# Patient Record
Sex: Male | Born: 1970 | Race: White | Hispanic: No | Smoking: Never smoker
Health system: Southern US, Community
[De-identification: ages and names within clinical notes are randomized; demographics above are authoritative.]

## PROBLEM LIST (undated history)

## (undated) DIAGNOSIS — Z800 Family history of malignant neoplasm of digestive organs: Secondary | ICD-10-CM

## (undated) DIAGNOSIS — D649 Anemia, unspecified: Secondary | ICD-10-CM

## (undated) DIAGNOSIS — N289 Disorder of kidney and ureter, unspecified: Secondary | ICD-10-CM

## (undated) DIAGNOSIS — R51 Headache: Secondary | ICD-10-CM

## (undated) DIAGNOSIS — F419 Anxiety disorder, unspecified: Secondary | ICD-10-CM

## (undated) DIAGNOSIS — A4902 Methicillin resistant Staphylococcus aureus infection, unspecified site: Secondary | ICD-10-CM

## (undated) DIAGNOSIS — I10 Essential (primary) hypertension: Secondary | ICD-10-CM

## (undated) DIAGNOSIS — R519 Headache, unspecified: Secondary | ICD-10-CM

## (undated) DIAGNOSIS — Z8042 Family history of malignant neoplasm of prostate: Secondary | ICD-10-CM

## (undated) DIAGNOSIS — Z803 Family history of malignant neoplasm of breast: Secondary | ICD-10-CM

## (undated) DIAGNOSIS — L0390 Cellulitis, unspecified: Secondary | ICD-10-CM

## (undated) DIAGNOSIS — Z8051 Family history of malignant neoplasm of kidney: Secondary | ICD-10-CM

## (undated) DIAGNOSIS — G4730 Sleep apnea, unspecified: Secondary | ICD-10-CM

## (undated) DIAGNOSIS — Z801 Family history of malignant neoplasm of trachea, bronchus and lung: Secondary | ICD-10-CM

## (undated) DIAGNOSIS — K219 Gastro-esophageal reflux disease without esophagitis: Secondary | ICD-10-CM

## (undated) DIAGNOSIS — E039 Hypothyroidism, unspecified: Secondary | ICD-10-CM

## (undated) HISTORY — DX: Family history of malignant neoplasm of digestive organs: Z80.0

## (undated) HISTORY — DX: Family history of malignant neoplasm of trachea, bronchus and lung: Z80.1

## (undated) HISTORY — DX: Anemia, unspecified: D64.9

## (undated) HISTORY — DX: Family history of malignant neoplasm of breast: Z80.3

## (undated) HISTORY — DX: Family history of malignant neoplasm of kidney: Z80.51

## (undated) HISTORY — DX: Family history of malignant neoplasm of prostate: Z80.42

---

## 1999-03-06 ENCOUNTER — Other Ambulatory Visit: Admission: RE | Admit: 1999-03-06 | Discharge: 1999-03-24 | Payer: Self-pay

## 2008-12-07 ENCOUNTER — Emergency Department: Payer: Self-pay | Admitting: Emergency Medicine

## 2009-10-14 ENCOUNTER — Ambulatory Visit: Payer: Self-pay | Admitting: Internal Medicine

## 2009-10-18 ENCOUNTER — Ambulatory Visit: Payer: Self-pay | Admitting: Surgery

## 2009-11-27 ENCOUNTER — Emergency Department: Payer: Self-pay | Admitting: Emergency Medicine

## 2010-01-30 ENCOUNTER — Ambulatory Visit: Payer: Self-pay | Admitting: Internal Medicine

## 2010-05-22 ENCOUNTER — Emergency Department: Payer: Self-pay | Admitting: Emergency Medicine

## 2010-05-27 ENCOUNTER — Other Ambulatory Visit: Payer: Self-pay | Admitting: Family

## 2010-06-02 ENCOUNTER — Ambulatory Visit: Payer: Self-pay | Admitting: Internal Medicine

## 2010-06-13 ENCOUNTER — Ambulatory Visit: Payer: Self-pay | Admitting: Internal Medicine

## 2010-07-10 ENCOUNTER — Emergency Department: Payer: Self-pay | Admitting: Emergency Medicine

## 2011-01-29 ENCOUNTER — Ambulatory Visit: Payer: Self-pay | Admitting: General Practice

## 2011-03-23 ENCOUNTER — Ambulatory Visit: Payer: Self-pay | Admitting: Internal Medicine

## 2011-04-08 ENCOUNTER — Ambulatory Visit: Payer: Self-pay | Admitting: Internal Medicine

## 2011-04-08 ENCOUNTER — Ambulatory Visit: Payer: Self-pay | Admitting: Oncology

## 2011-04-14 ENCOUNTER — Ambulatory Visit: Payer: Self-pay | Admitting: Oncology

## 2011-04-18 ENCOUNTER — Ambulatory Visit: Payer: Self-pay | Admitting: Oncology

## 2011-05-19 ENCOUNTER — Ambulatory Visit: Payer: Self-pay | Admitting: Oncology

## 2011-05-19 HISTORY — PX: LAPAROSCOPIC GASTRIC SLEEVE RESECTION: SHX5895

## 2011-07-21 ENCOUNTER — Ambulatory Visit: Payer: Self-pay | Admitting: Oncology

## 2011-08-03 ENCOUNTER — Ambulatory Visit: Payer: Self-pay | Admitting: Oncology

## 2011-08-03 LAB — CBC CANCER CENTER
Basophil #: 0.1 x10 3/mm (ref 0.0–0.1)
Basophil %: 1.2 %
Eosinophil %: 6.4 %
HCT: 42.1 % (ref 40.0–52.0)
Lymphocyte %: 25.6 %
MCH: 31.8 pg (ref 26.0–34.0)
MCHC: 35.4 g/dL (ref 32.0–36.0)
MCV: 90 fL (ref 80–100)
Monocyte %: 8.8 %
Neutrophil #: 4.2 x10 3/mm (ref 1.4–6.5)
WBC: 7.3 x10 3/mm (ref 3.8–10.6)

## 2011-08-03 LAB — COMPREHENSIVE METABOLIC PANEL
Albumin: 4.1 g/dL (ref 3.4–5.0)
Anion Gap: 13 (ref 7–16)
Bilirubin,Total: 0.6 mg/dL (ref 0.2–1.0)
Calcium, Total: 8.5 mg/dL (ref 8.5–10.1)
Chloride: 102 mmol/L (ref 98–107)
Co2: 21 mmol/L (ref 21–32)
Creatinine: 1.25 mg/dL (ref 0.60–1.30)
EGFR (African American): >60
EGFR (Non-African Amer.): >60
Glucose: 97 mg/dL (ref 65–99)
Potassium: 4.3 mmol/L (ref 3.5–5.1)
SGOT(AST): 10 U/L — ABNORMAL LOW (ref 15–37)
Sodium: 136 mmol/L (ref 136–145)

## 2011-08-17 ENCOUNTER — Ambulatory Visit: Payer: Self-pay | Admitting: Oncology

## 2011-12-08 ENCOUNTER — Emergency Department: Payer: Self-pay | Admitting: Emergency Medicine

## 2011-12-10 LAB — WOUND AEROBIC CULTURE

## 2012-04-13 ENCOUNTER — Emergency Department: Payer: Self-pay | Admitting: Emergency Medicine

## 2012-04-13 LAB — BASIC METABOLIC PANEL
Calcium, Total: 9.3 mg/dL (ref 8.5–10.1)
Chloride: 105 mmol/L (ref 98–107)
Co2: 25 mmol/L (ref 21–32)
EGFR (African American): >60
EGFR (Non-African Amer.): >60
Glucose: 103 mg/dL — ABNORMAL HIGH (ref 65–99)
Osmolality: 273 (ref 275–301)
Sodium: 137 mmol/L (ref 136–145)

## 2012-04-13 LAB — CBC
HCT: 44.3 % (ref 40.0–52.0)
MCHC: 35.2 g/dL (ref 32.0–36.0)
RDW: 13.2 % (ref 11.5–14.5)

## 2012-05-18 DIAGNOSIS — A4902 Methicillin resistant Staphylococcus aureus infection, unspecified site: Secondary | ICD-10-CM

## 2012-05-18 HISTORY — DX: Methicillin resistant Staphylococcus aureus infection, unspecified site: A49.02

## 2012-09-04 ENCOUNTER — Emergency Department: Payer: Self-pay | Admitting: Emergency Medicine

## 2012-09-04 ENCOUNTER — Ambulatory Visit: Payer: Self-pay | Admitting: Emergency Medicine

## 2012-09-04 LAB — CBC
HCT: 41 % (ref 40.0–52.0)
HGB: 14.2 g/dL (ref 13.0–18.0)
MCH: 31.7 pg (ref 26.0–34.0)
MCHC: 34.7 g/dL (ref 32.0–36.0)
MCV: 91 fL (ref 80–100)
RBC: 4.49 10*6/uL (ref 4.40–5.90)
WBC: 13.5 10*3/uL — ABNORMAL HIGH (ref 3.8–10.6)

## 2012-09-04 LAB — COMPREHENSIVE METABOLIC PANEL
Albumin: 4 g/dL (ref 3.4–5.0)
Alkaline Phosphatase: 55 U/L (ref 50–136)
Anion Gap: 5 — ABNORMAL LOW (ref 7–16)
Bilirubin,Total: 0.7 mg/dL (ref 0.2–1.0)
Calcium, Total: 8.9 mg/dL (ref 8.5–10.1)
Chloride: 106 mmol/L (ref 98–107)
Co2: 26 mmol/L (ref 21–32)
Creatinine: 1.16 mg/dL (ref 0.60–1.30)
EGFR (African American): >60
EGFR (Non-African Amer.): >60
Glucose: 85 mg/dL (ref 65–99)
Osmolality: 272 (ref 275–301)
SGOT(AST): 33 U/L (ref 15–37)
Total Protein: 8.1 g/dL (ref 6.4–8.2)

## 2012-12-07 ENCOUNTER — Ambulatory Visit: Payer: Self-pay | Admitting: Specialist

## 2012-12-07 LAB — CBC WITH DIFFERENTIAL/PLATELET
Basophil #: 0.1 10*3/uL (ref 0.0–0.1)
Basophil %: 1.3 %
Lymphocyte #: 1.6 10*3/uL (ref 1.0–3.6)
Lymphocyte %: 22.3 %
Monocyte #: 0.6 x10 3/mm (ref 0.2–1.0)
Monocyte %: 8.7 %
Neutrophil #: 4.3 10*3/uL (ref 1.4–6.5)
Platelet: 312 10*3/uL (ref 150–440)
RBC: 4.79 10*6/uL (ref 4.40–5.90)
RDW: 14.3 % (ref 11.5–14.5)

## 2012-12-07 LAB — COMPREHENSIVE METABOLIC PANEL
Albumin: 4.2 g/dL (ref 3.4–5.0)
Anion Gap: 4 — ABNORMAL LOW (ref 7–16)
BUN: 19 mg/dL — ABNORMAL HIGH (ref 7–18)
Bilirubin,Total: 0.6 mg/dL (ref 0.2–1.0)
Calcium, Total: 9.1 mg/dL (ref 8.5–10.1)
Chloride: 105 mmol/L (ref 98–107)
Co2: 27 mmol/L (ref 21–32)
Creatinine: 1.11 mg/dL (ref 0.60–1.30)
EGFR (African American): >60
Glucose: 99 mg/dL (ref 65–99)
Osmolality: 274 (ref 275–301)
SGOT(AST): 34 U/L (ref 15–37)
SGPT (ALT): 47 U/L (ref 12–78)
Sodium: 136 mmol/L (ref 136–145)
Total Protein: 8.4 g/dL — ABNORMAL HIGH (ref 6.4–8.2)

## 2012-12-07 LAB — LIPASE, BLOOD: Lipase: 172 U/L (ref 73–393)

## 2012-12-07 LAB — AMYLASE: Amylase: 38 U/L (ref 25–115)

## 2012-12-07 LAB — IRON AND TIBC
Iron: 94 ug/dL (ref 65–175)
Unbound Iron-Bind.Cap.: 278 ug/dL

## 2012-12-07 LAB — TSH: Thyroid Stimulating Horm: 11.5 u[IU]/mL — ABNORMAL HIGH

## 2012-12-07 LAB — MAGNESIUM: Magnesium: 2.2 mg/dL

## 2012-12-07 LAB — FOLATE: Folic Acid: 6.2 ng/mL (ref 3.1–100.0)

## 2012-12-07 LAB — BILIRUBIN, DIRECT: Bilirubin, Direct: 0.1 mg/dL (ref 0.00–0.20)

## 2012-12-07 LAB — APTT: Activated PTT: 30.8 secs (ref 23.6–35.9)

## 2012-12-07 LAB — PROTIME-INR: INR: 1

## 2012-12-15 ENCOUNTER — Ambulatory Visit: Payer: Self-pay | Admitting: Specialist

## 2012-12-16 ENCOUNTER — Ambulatory Visit: Payer: Self-pay | Admitting: Specialist

## 2013-02-17 ENCOUNTER — Ambulatory Visit: Payer: Self-pay | Admitting: Emergency Medicine

## 2013-02-17 ENCOUNTER — Inpatient Hospital Stay: Payer: Self-pay | Admitting: Internal Medicine

## 2013-02-17 LAB — URINALYSIS, COMPLETE
Bacteria: NEGATIVE
Bilirubin,UR: NEGATIVE
Blood: NEGATIVE
Glucose,UR: NEGATIVE mg/dL (ref 0–75)
Ketone: NEGATIVE
Leukocyte Esterase: NEGATIVE
Nitrite: NEGATIVE
Ph: 7 (ref 4.5–8.0)
Protein: NEGATIVE
RBC,UR: NONE SEEN /HPF (ref 0–5)
Specific Gravity: 1.02 (ref 1.003–1.030)
Squamous Epithelial: NONE SEEN

## 2013-02-17 LAB — CBC WITH DIFFERENTIAL/PLATELET
Basophil #: 0.1 10*3/uL (ref 0.0–0.1)
Basophil %: 0.9 %
Eosinophil #: 0.3 10*3/uL (ref 0.0–0.7)
Eosinophil %: 2.6 %
HCT: 45.6 % (ref 40.0–52.0)
HGB: 16 g/dL (ref 13.0–18.0)
Lymphocyte #: 1.1 10*3/uL (ref 1.0–3.6)
Lymphocyte %: 8.8 %
MCH: 32.1 pg (ref 26.0–34.0)
MCHC: 35.1 g/dL (ref 32.0–36.0)
MCV: 92 fL (ref 80–100)
Monocyte #: 0.9 x10 3/mm (ref 0.2–1.0)
Monocyte %: 7.4 %
Neutrophil #: 10.2 10*3/uL — ABNORMAL HIGH (ref 1.4–6.5)
Neutrophil %: 80.3 %
Platelet: 315 10*3/uL (ref 150–440)
RBC: 4.98 10*6/uL (ref 4.40–5.90)
RDW: 13.9 % (ref 11.5–14.5)
WBC: 12.7 10*3/uL — ABNORMAL HIGH (ref 3.8–10.6)

## 2013-02-17 LAB — COMPREHENSIVE METABOLIC PANEL
Albumin: 4.2 g/dL (ref 3.4–5.0)
Alkaline Phosphatase: 90 U/L (ref 50–136)
Anion Gap: 10 (ref 7–16)
BUN: 11 mg/dL (ref 7–18)
Bilirubin,Total: 1 mg/dL (ref 0.2–1.0)
Calcium, Total: 8.7 mg/dL (ref 8.5–10.1)
Chloride: 97 mmol/L — ABNORMAL LOW (ref 98–107)
Co2: 25 mmol/L (ref 21–32)
Creatinine: 1.18 mg/dL (ref 0.60–1.30)
EGFR (African American): >60
EGFR (Non-African Amer.): >60
Glucose: 142 mg/dL — ABNORMAL HIGH (ref 65–99)
Osmolality: 266 (ref 275–301)
Potassium: 4.8 mmol/L (ref 3.5–5.1)
SGOT(AST): 45 U/L — ABNORMAL HIGH (ref 15–37)
SGPT (ALT): 55 U/L (ref 12–78)
Sodium: 132 mmol/L — ABNORMAL LOW (ref 136–145)
Total Protein: 8.7 g/dL — ABNORMAL HIGH (ref 6.4–8.2)

## 2013-02-17 LAB — LIPID PANEL: HDL Cholesterol: 30 mg/dL — ABNORMAL LOW (ref 40–60)

## 2013-02-17 LAB — AMYLASE: Amylase: 150 U/L — ABNORMAL HIGH (ref 25–115)

## 2013-02-17 LAB — LIPASE, BLOOD: Lipase: 1725 U/L — ABNORMAL HIGH (ref 73–393)

## 2013-02-17 LAB — CREATININE, SERUM: Creatinine: 1.05 mg/dL (ref 0.60–1.30)

## 2013-02-18 LAB — COMPREHENSIVE METABOLIC PANEL
Albumin: 3.4 g/dL (ref 3.4–5.0)
Alkaline Phosphatase: 68 U/L (ref 50–136)
Anion Gap: 9 (ref 7–16)
Bilirubin,Total: 1 mg/dL (ref 0.2–1.0)
Calcium, Total: 8.8 mg/dL (ref 8.5–10.1)
Chloride: 102 mmol/L (ref 98–107)
Creatinine: 1.11 mg/dL (ref 0.60–1.30)
EGFR (African American): >60
Potassium: 4.1 mmol/L (ref 3.5–5.1)
SGOT(AST): 44 U/L — ABNORMAL HIGH (ref 15–37)

## 2013-02-18 LAB — CBC WITH DIFFERENTIAL/PLATELET
Eosinophil %: 3.7 %
HCT: 38.7 % — ABNORMAL LOW (ref 40.0–52.0)
Lymphocyte #: 1 10*3/uL (ref 1.0–3.6)
Lymphocyte %: 9.1 %
MCH: 32.6 pg (ref 26.0–34.0)
MCHC: 35.8 g/dL (ref 32.0–36.0)
MCV: 91 fL (ref 80–100)
Monocyte #: 1.1 x10 3/mm — ABNORMAL HIGH (ref 0.2–1.0)
Neutrophil #: 8.3 10*3/uL — ABNORMAL HIGH (ref 1.4–6.5)
Neutrophil %: 76.4 %
Platelet: 263 10*3/uL (ref 150–440)
RBC: 4.25 10*6/uL — ABNORMAL LOW (ref 4.40–5.90)
WBC: 10.8 10*3/uL — ABNORMAL HIGH (ref 3.8–10.6)

## 2013-02-18 LAB — LIPASE, BLOOD: Lipase: 711 U/L — ABNORMAL HIGH (ref 73–393)

## 2013-02-18 LAB — TRIGLYCERIDES: Triglycerides: 761 mg/dL — ABNORMAL HIGH (ref 0–200)

## 2013-02-18 LAB — HEMOGLOBIN A1C: Hemoglobin A1C: 5.6 % (ref 4.2–6.3)

## 2013-02-19 LAB — TRIGLYCERIDES: Triglycerides: 538 mg/dL — ABNORMAL HIGH (ref 0–200)

## 2013-02-20 LAB — TRIGLYCERIDES: Triglycerides: 484 mg/dL — ABNORMAL HIGH (ref 0–200)

## 2013-02-22 LAB — CREATININE, SERUM
Creatinine: 1.28 mg/dL (ref 0.60–1.30)
EGFR (African American): >60
EGFR (Non-African Amer.): >60

## 2013-04-03 ENCOUNTER — Ambulatory Visit: Payer: Self-pay | Admitting: Specialist

## 2013-04-04 ENCOUNTER — Inpatient Hospital Stay: Payer: Self-pay | Admitting: Specialist

## 2013-04-04 LAB — CREATININE, SERUM
EGFR (African American): >60
EGFR (Non-African Amer.): >60

## 2013-04-05 LAB — PHOSPHORUS: Phosphorus: 4 mg/dL (ref 2.5–4.9)

## 2013-04-05 LAB — CBC WITH DIFFERENTIAL/PLATELET
Basophil #: 0 10*3/uL (ref 0.0–0.1)
Eosinophil #: 0.1 10*3/uL (ref 0.0–0.7)
Eosinophil %: 1.3 %
HCT: 41.1 % (ref 40.0–52.0)
HGB: 14.1 g/dL (ref 13.0–18.0)
Lymphocyte #: 1.6 10*3/uL (ref 1.0–3.6)
MCH: 31.3 pg (ref 26.0–34.0)
Monocyte #: 0.9 x10 3/mm (ref 0.2–1.0)
Monocyte %: 9.3 %
Platelet: 318 10*3/uL (ref 150–440)
WBC: 9.3 10*3/uL (ref 3.8–10.6)

## 2013-04-05 LAB — BASIC METABOLIC PANEL
BUN: 22 mg/dL — ABNORMAL HIGH (ref 7–18)
Calcium, Total: 8.5 mg/dL (ref 8.5–10.1)
Chloride: 105 mmol/L (ref 98–107)
Co2: 27 mmol/L (ref 21–32)
EGFR (African American): >60
EGFR (Non-African Amer.): >60
Potassium: 4.5 mmol/L (ref 3.5–5.1)
Sodium: 136 mmol/L (ref 136–145)

## 2013-04-07 LAB — PATHOLOGY REPORT

## 2013-04-24 ENCOUNTER — Ambulatory Visit: Payer: Self-pay | Admitting: Specialist

## 2013-05-18 ENCOUNTER — Ambulatory Visit: Payer: Self-pay | Admitting: Specialist

## 2013-05-18 HISTORY — PX: ESOPHAGOGASTRODUODENOSCOPY: SHX1529

## 2013-05-18 HISTORY — PX: COLONOSCOPY: SHX174

## 2013-11-03 ENCOUNTER — Emergency Department: Payer: Self-pay | Admitting: Emergency Medicine

## 2013-11-03 LAB — URINALYSIS, COMPLETE
BACTERIA: NONE SEEN
BILIRUBIN, UR: NEGATIVE
Blood: NEGATIVE
Glucose,UR: NEGATIVE mg/dL (ref 0–75)
KETONE: NEGATIVE
Leukocyte Esterase: NEGATIVE
Nitrite: NEGATIVE
PROTEIN: NEGATIVE
Ph: 7 (ref 4.5–8.0)
RBC, UR: NONE SEEN /HPF (ref 0–5)
Specific Gravity: 1.006 (ref 1.003–1.030)
Squamous Epithelial: NONE SEEN

## 2013-11-03 LAB — COMPREHENSIVE METABOLIC PANEL
AST: 28 U/L (ref 15–37)
Albumin: 3.8 g/dL (ref 3.4–5.0)
Alkaline Phosphatase: 60 U/L
Anion Gap: 6 — ABNORMAL LOW (ref 7–16)
BUN: 14 mg/dL (ref 7–18)
Bilirubin,Total: 0.9 mg/dL (ref 0.2–1.0)
CALCIUM: 8.5 mg/dL (ref 8.5–10.1)
CHLORIDE: 105 mmol/L (ref 98–107)
CO2: 26 mmol/L (ref 21–32)
Creatinine: 1.22 mg/dL (ref 0.60–1.30)
EGFR (African American): >60
EGFR (Non-African Amer.): >60
Glucose: 92 mg/dL (ref 65–99)
Osmolality: 274 (ref 275–301)
Potassium: 3.7 mmol/L (ref 3.5–5.1)
SGPT (ALT): 31 U/L (ref 12–78)
Sodium: 137 mmol/L (ref 136–145)
TOTAL PROTEIN: 7.1 g/dL (ref 6.4–8.2)

## 2013-11-03 LAB — CBC WITH DIFFERENTIAL/PLATELET
Basophil #: 0.1 10*3/uL (ref 0.0–0.1)
Basophil %: 0.8 %
EOS ABS: 0.3 10*3/uL (ref 0.0–0.7)
EOS PCT: 3.2 %
HCT: 41.8 % (ref 40.0–52.0)
HGB: 14.6 g/dL (ref 13.0–18.0)
LYMPHS PCT: 15.1 %
Lymphocyte #: 1.5 10*3/uL (ref 1.0–3.6)
MCH: 31.6 pg (ref 26.0–34.0)
MCHC: 34.8 g/dL (ref 32.0–36.0)
MCV: 91 fL (ref 80–100)
MONOS PCT: 5.4 %
Monocyte #: 0.5 x10 3/mm (ref 0.2–1.0)
Neutrophil #: 7.3 10*3/uL — ABNORMAL HIGH (ref 1.4–6.5)
Neutrophil %: 75.5 %
Platelet: 307 10*3/uL (ref 150–440)
RBC: 4.62 10*6/uL (ref 4.40–5.90)
RDW: 13 % (ref 11.5–14.5)
WBC: 9.7 10*3/uL (ref 3.8–10.6)

## 2013-11-03 LAB — PROTIME-INR
INR: 1.1
Prothrombin Time: 14.2 secs (ref 11.5–14.7)

## 2013-11-03 LAB — CK TOTAL AND CKMB (NOT AT ARMC)
CK, TOTAL: 157 U/L
CK-MB: 1.7 ng/mL (ref 0.5–3.6)

## 2013-11-03 LAB — TROPONIN I: Troponin-I: <0.02 ng/mL

## 2013-11-27 ENCOUNTER — Ambulatory Visit: Payer: Self-pay | Admitting: Family Medicine

## 2013-11-29 ENCOUNTER — Emergency Department: Payer: Self-pay | Admitting: Emergency Medicine

## 2013-11-29 LAB — CBC WITH DIFFERENTIAL/PLATELET
BASOS ABS: 0.1 10*3/uL (ref 0.0–0.1)
Basophil %: 0.7 %
EOS ABS: 0.6 10*3/uL (ref 0.0–0.7)
EOS PCT: 6.5 %
HCT: 44.2 % (ref 40.0–52.0)
HGB: 15.1 g/dL (ref 13.0–18.0)
LYMPHS ABS: 2 10*3/uL (ref 1.0–3.6)
LYMPHS PCT: 23.4 %
MCH: 31.4 pg (ref 26.0–34.0)
MCHC: 34.2 g/dL (ref 32.0–36.0)
MCV: 92 fL (ref 80–100)
Monocyte #: 0.5 x10 3/mm (ref 0.2–1.0)
Monocyte %: 6.4 %
Neutrophil #: 5.4 10*3/uL (ref 1.4–6.5)
Neutrophil %: 63 %
Platelet: 324 10*3/uL (ref 150–440)
RBC: 4.82 10*6/uL (ref 4.40–5.90)
RDW: 13.8 % (ref 11.5–14.5)
WBC: 8.5 10*3/uL (ref 3.8–10.6)

## 2013-11-29 LAB — LIPASE, BLOOD: Lipase: 159 U/L (ref 73–393)

## 2013-11-29 LAB — URINALYSIS, COMPLETE
Bacteria: NONE SEEN
Bilirubin,UR: NEGATIVE
Blood: NEGATIVE
GLUCOSE, UR: NEGATIVE mg/dL (ref 0–75)
Ketone: NEGATIVE
LEUKOCYTE ESTERASE: NEGATIVE
Nitrite: NEGATIVE
PROTEIN: NEGATIVE
Ph: 6 (ref 4.5–8.0)
RBC,UR: <1 /HPF (ref 0–5)
SQUAMOUS EPITHELIAL: NONE SEEN
Specific Gravity: 1.011 (ref 1.003–1.030)

## 2013-11-29 LAB — COMPREHENSIVE METABOLIC PANEL
ANION GAP: 5 — AB (ref 7–16)
AST: 28 U/L (ref 15–37)
Albumin: 4.1 g/dL (ref 3.4–5.0)
Alkaline Phosphatase: 71 U/L
BUN: 17 mg/dL (ref 7–18)
Bilirubin,Total: 0.6 mg/dL (ref 0.2–1.0)
CHLORIDE: 105 mmol/L (ref 98–107)
CO2: 27 mmol/L (ref 21–32)
CREATININE: 1.34 mg/dL — AB (ref 0.60–1.30)
Calcium, Total: 8.6 mg/dL (ref 8.5–10.1)
EGFR (African American): >60
Glucose: 99 mg/dL (ref 65–99)
Osmolality: 275 (ref 275–301)
POTASSIUM: 4.7 mmol/L (ref 3.5–5.1)
SGPT (ALT): 37 U/L (ref 12–78)
Sodium: 137 mmol/L (ref 136–145)
TOTAL PROTEIN: 7.9 g/dL (ref 6.4–8.2)

## 2013-12-02 ENCOUNTER — Ambulatory Visit: Payer: Self-pay | Admitting: Family Medicine

## 2013-12-11 ENCOUNTER — Ambulatory Visit: Payer: Self-pay | Admitting: Physician Assistant

## 2013-12-14 ENCOUNTER — Ambulatory Visit: Payer: Self-pay | Admitting: Gastroenterology

## 2014-01-03 ENCOUNTER — Ambulatory Visit: Payer: Self-pay | Admitting: Gastroenterology

## 2014-07-21 IMAGING — CR DG CHEST 1V PORT
1 series · 1 of 1 positions shown · non-contrast
Comparison: Chest radiograph performed 12/07/2012

CLINICAL DATA: Chest pain and body cramping.

EXAM:
PORTABLE CHEST - 1 VIEW

[ap]
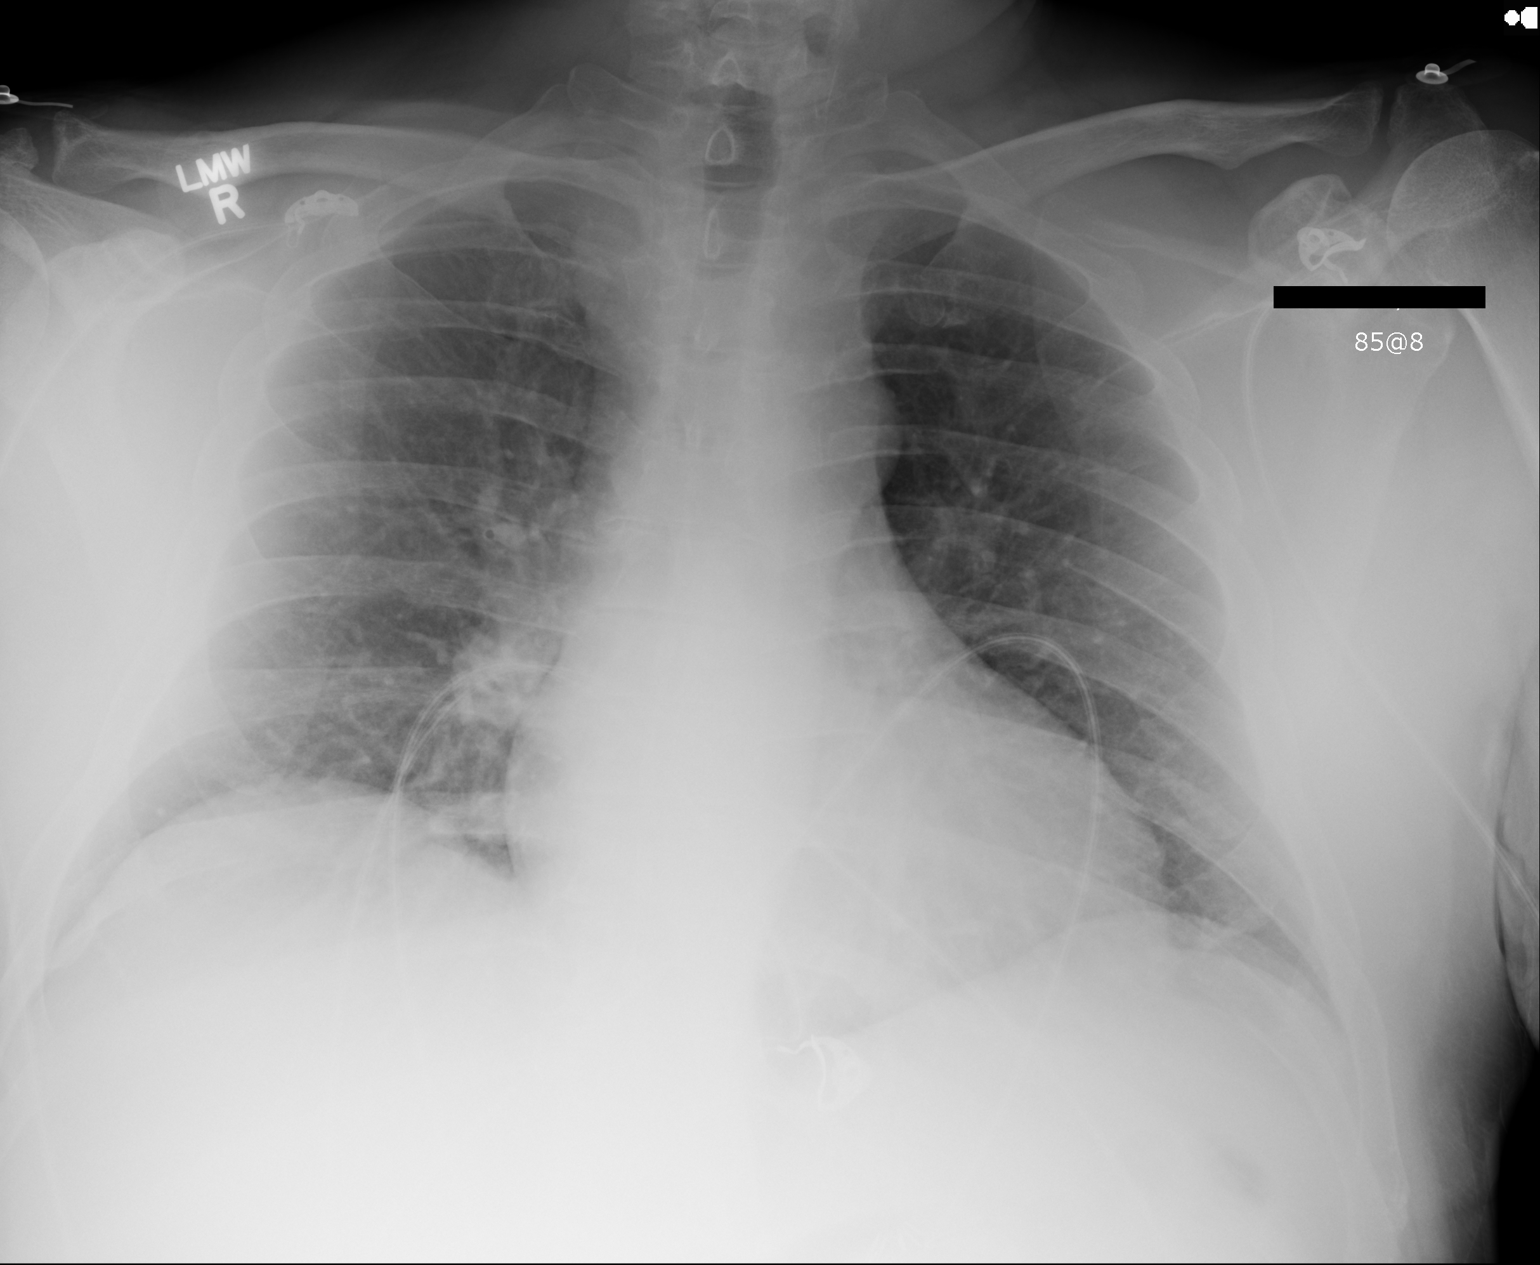

[1 of 1 positions shown; findings below may reference images not displayed]

FINDINGS: The lungs are well-aerated. Vascular congestion is noted. Mild
bibasilar opacities likely reflect atelectasis. There is no evidence
of focal opacification, pleural effusion or pneumothorax.

The cardiomediastinal silhouette is borderline normal in size. No
acute osseous abnormalities are seen.
IMPRESSION: Vascular congestion noted. Mild bibasilar opacities likely reflect
atelectasis.

## 2014-08-28 IMAGING — CR DG WRIST COMPLETE 3+V*R*
4 series · 4 of 4 positions shown · non-contrast
Comparison: MRI of the right wrist dated January 29, 2011

CLINICAL DATA: Status post fall now with pain and swelling.

EXAM:
RIGHT WRIST - COMPLETE 3+ VIEW

[wrist pa]
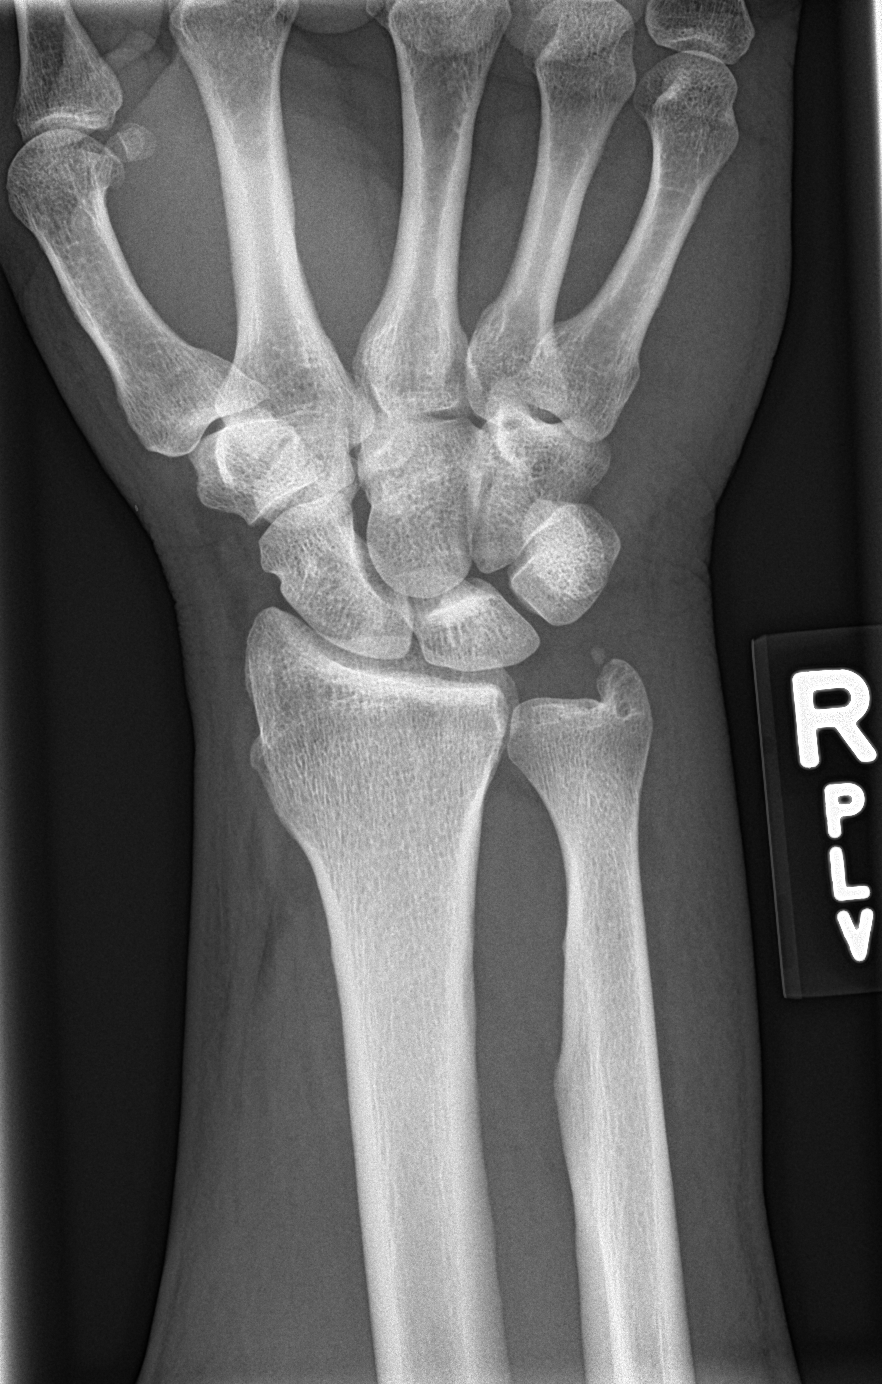

[wrist obl]
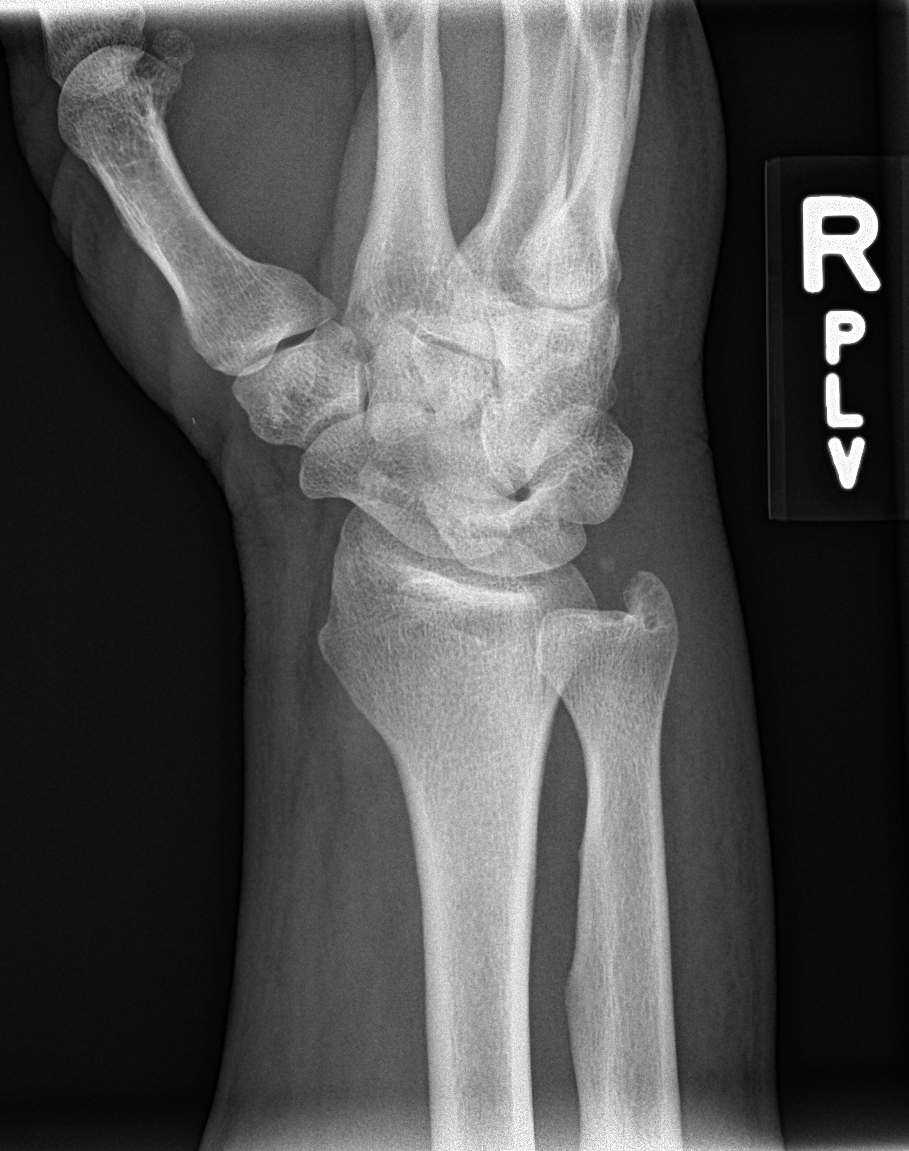

[wrist lat]
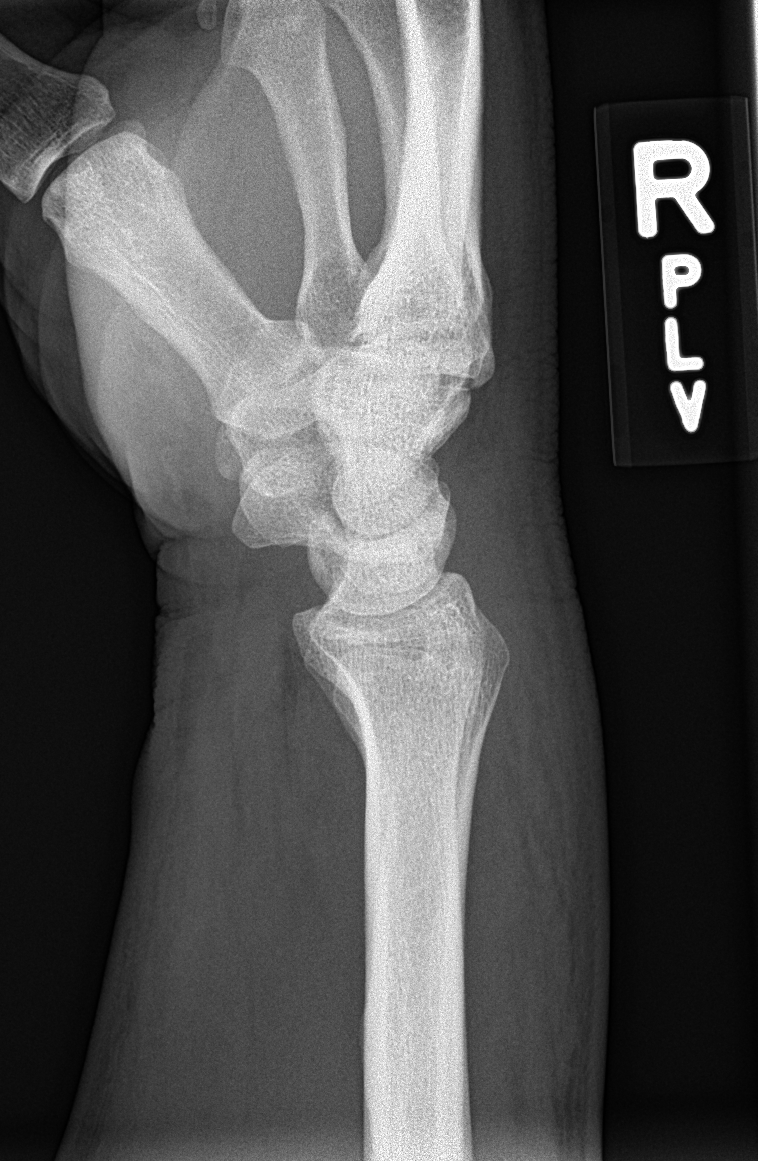

[wrist navicular]
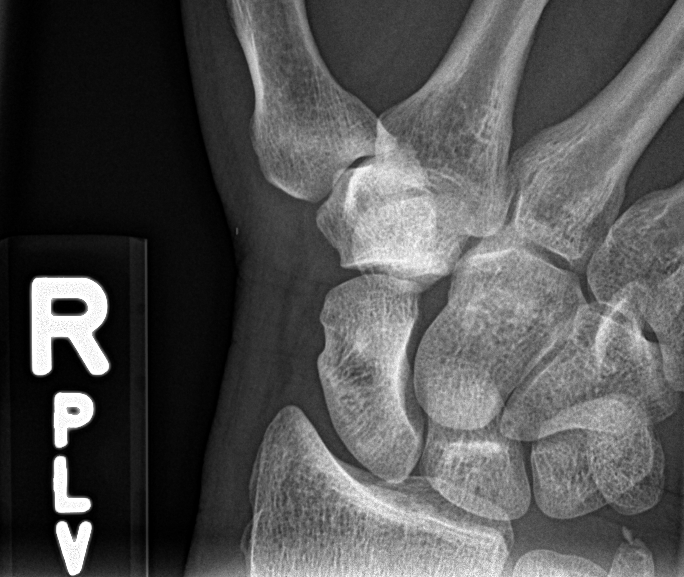

[4 of 4 positions shown; findings below may reference images not displayed]

FINDINGS: The bones are adequately mineralized. There is a stable bony density
adjacent to the tip of the ulnar styloid consistent with an
accessory ossicle or old avulsion. The distal radius is intact. The
carpal bones exhibit no acute abnormality. The scaphoid view reveals
no fracture or dislocation. The carpometacarpal joints are
unremarkable. There is mild diffuse soft tissue swelling.
IMPRESSION: There is no acute bony abnormality of the right wrist. There is soft
tissue swelling.

## 2014-09-04 ENCOUNTER — Emergency Department
Admit: 2014-09-04 | Disposition: A | Discharge status: Home / Self Care | Payer: Self-pay | Admitting: Emergency Medicine

## 2014-09-04 NOTE — Consult Note (Signed)
Brief Consult Note: Diagnosis: left arm abscess.   Patient was seen by consultant.   Consult note dictated.   Recommend to proceed with surgery or procedure.   Orders entered.   Discussed with Attending MD.   Comments: plan I&D arm abscess risks and options rev'd.  Electronic Signatures: Lattie Hawooper, Everitt Wenner E (MD)  (Signed 23-Jul-13 11:38)  Authored: Brief Consult Note   Last Updated: 23-Jul-13 11:38 by Lattie Hawooper, Raianna Slight E (MD)

## 2014-09-04 NOTE — Consult Note (Signed)
PATIENT NAME:  Trevor Torres, Trevor Torres MR#:  [id number] DATE OF BIRTH:  09/17/1970  DATE OF CONSULTATION:  12/08/2011  REFERRING PHYSICIAN:   CONSULTING PHYSICIAN:  Jerrol Banana. Burt Knack, MD  CHIEF COMPLAINT: Left arm abscess.   HISTORY OF PRESENT ILLNESS: I was contacted by this patient's primary care physician, Dr. Heber Gypsy, who called and we spoke directly concerning this patient's probable left arm abscess. I asked the patient to come to the Emergency Room where I met him for evaluation and treatment.   The patient describes several days ago working in a shed and moving some materials out of the shed and noticing some itching sensation in his left forearm. He started scratching it and noticed increased swelling with pain and some redness around the central area over the last few days. It has also drained some serous fluid. He denies fevers or chills and has never had an episode like this before. He does not relate this to any visibly involved insect bite or foreign body, etc.   PAST MEDICAL HISTORY: None.   PAST SURGICAL HISTORY: None.   ALLERGIES: No known drug allergies.   MEDICATIONS: None.   FAMILY HISTORY: Noncontributory.   SOCIAL HISTORY: The patient works in Architect. He does not smoke, rarely drinks alcohol.   REVIEW OF SYSTEMS: Ten system review is performed and negative with the exception of that mentioned in the history of present illness.   PHYSICAL EXAMINATION:   GENERAL: Morbidly obese male patient, 320 pounds, 70 inches tall, and 43 is his BMI.   VITAL SIGNS: Temperature 98.8, pulse 84, respirations 20, blood pressure 157/102, 97% room air saturation and pain scale of 5.   HEENT: No scleral icterus.   NECK: No palpable neck nodes.   CHEST: Clear to auscultation.   CARDIAC: Regular rate and rhythm.   ABDOMEN: Soft, nontender.   EXTREMITIES: On the left radial surface of the forearm there is an approximately 6 x 8 cm indurated erythematous area. There is also  streaking of erythema going up his arm representing lymphangitis. There is a central core of necrosis present with no expressible pus and it is moderately tender to palpation. He has good distal finger function.  RESULTS: No labs are available.   ASSESSMENT AND PLAN: This is a patient with a history and physical examination consistent with a likely insect bite that has become infected. He requires incision and drainage. I discussed with him the options of observation and the fact that he has lymphangitis dictates fairly aggressive therapy in this patient. He understood and agreed to proceed with this plan. Informed consent was obtained concerning the risks of bleeding and infection and recurrence and open wound. He understood and agreed to proceed.   The patient was prepped in a sterile fashion and utilizing an 11 blade a stab type incision was performed in the necrotic tissue and a small amount of purulence exuded. This was cultured. The culture was sent off for examination. The area was then dressed with a dry dressing and he was left in stable condition in the Emergency Room.   My recommendations are to remove the dressing in the office tomorrow. I will see the patient personally in the office tomorrow, starting him on Keflex at this time 500 mg four times daily     and giving him a prescription for both Keflex as well as pain medications. Cultures have been obtained and I will see him tomorrow in the office. He understood and agreed with this plan. ____________________________ Delfino Lovett  Melchor Amour, MD rec:slb D: 12/08/2011 12:06:47 ET T: 12/08/2011 12:57:55 ET JOB#: [id number]  cc: Jerrol Banana. Burt Knack, MD, <Dictator> Maureen P. Heber [place], MD Florene Glen MD ELECTRONICALLY SIGNED 12/09/2011 10:55

## 2014-09-06 ENCOUNTER — Emergency Department (HOSPITAL_COMMUNITY): Payer: Worker's Compensation

## 2014-09-06 ENCOUNTER — Encounter (HOSPITAL_COMMUNITY): Payer: Self-pay

## 2014-09-06 ENCOUNTER — Emergency Department (HOSPITAL_COMMUNITY)
Admission: EM | Admit: 2014-09-06 | Discharge: 2014-09-06 | Disposition: A | Discharge status: Home / Self Care | Payer: Worker's Compensation | Attending: Emergency Medicine | Admitting: Emergency Medicine

## 2014-09-06 DIAGNOSIS — Y998 Other external cause status: Secondary | ICD-10-CM | POA: Insufficient documentation

## 2014-09-06 DIAGNOSIS — S59902A Unspecified injury of left elbow, initial encounter: Secondary | ICD-10-CM | POA: Insufficient documentation

## 2014-09-06 DIAGNOSIS — S8992XA Unspecified injury of left lower leg, initial encounter: Secondary | ICD-10-CM | POA: Insufficient documentation

## 2014-09-06 DIAGNOSIS — R52 Pain, unspecified: Secondary | ICD-10-CM

## 2014-09-06 DIAGNOSIS — Y9389 Activity, other specified: Secondary | ICD-10-CM | POA: Insufficient documentation

## 2014-09-06 DIAGNOSIS — T1490XA Injury, unspecified, initial encounter: Secondary | ICD-10-CM

## 2014-09-06 DIAGNOSIS — Y92410 Unspecified street and highway as the place of occurrence of the external cause: Secondary | ICD-10-CM | POA: Diagnosis not present

## 2014-09-06 LAB — CBC
HEMATOCRIT: 38.1 % — AB (ref 39.0–52.0)
HEMOGLOBIN: 13.5 g/dL (ref 13.0–17.0)
MCH: 32.5 pg (ref 26.0–34.0)
MCHC: 35.4 g/dL (ref 30.0–36.0)
MCV: 91.8 fL (ref 78.0–100.0)
Platelets: 251 10*3/uL (ref 150–400)
RBC: 4.15 MIL/uL — ABNORMAL LOW (ref 4.22–5.81)
RDW: 13.7 % (ref 11.5–15.5)
WBC: 9.7 10*3/uL (ref 4.0–10.5)

## 2014-09-06 LAB — I-STAT CHEM 8, ED
BUN: 12 mg/dL (ref 6–23)
CHLORIDE: 103 mmol/L (ref 96–112)
Calcium, Ion: 1.15 mmol/L (ref 1.12–1.23)
Creatinine, Ser: 1.1 mg/dL (ref 0.50–1.35)
Glucose, Bld: 93 mg/dL (ref 70–99)
HCT: 41 % (ref 39.0–52.0)
Hemoglobin: 13.9 g/dL (ref 13.0–17.0)
Potassium: 3.9 mmol/L (ref 3.5–5.1)
SODIUM: 137 mmol/L (ref 135–145)
TCO2: 22 mmol/L (ref 0–100)

## 2014-09-06 LAB — URINALYSIS, ROUTINE W REFLEX MICROSCOPIC
BILIRUBIN URINE: NEGATIVE
Glucose, UA: NEGATIVE mg/dL
Hgb urine dipstick: NEGATIVE
KETONES UR: NEGATIVE mg/dL
Leukocytes, UA: NEGATIVE
Nitrite: NEGATIVE
PROTEIN: NEGATIVE mg/dL
Specific Gravity, Urine: 1.035 — ABNORMAL HIGH (ref 1.005–1.030)
UROBILINOGEN UA: 1 mg/dL (ref 0.0–1.0)
pH: 6 (ref 5.0–8.0)

## 2014-09-06 LAB — COMPREHENSIVE METABOLIC PANEL
ALK PHOS: 54 U/L (ref 39–117)
ALT: 14 U/L (ref 0–53)
AST: 19 U/L (ref 0–37)
Albumin: 3.5 g/dL (ref 3.5–5.2)
Anion gap: 8 (ref 5–15)
BUN: 11 mg/dL (ref 6–23)
CO2: 26 mmol/L (ref 19–32)
Calcium: 8.7 mg/dL (ref 8.4–10.5)
Chloride: 103 mmol/L (ref 96–112)
Creatinine, Ser: 1.15 mg/dL (ref 0.50–1.35)
GFR calc non Af Amer: 78 mL/min — ABNORMAL LOW (ref >90)
GFR, EST AFRICAN AMERICAN: 90 mL/min — AB (ref >90)
GLUCOSE: 97 mg/dL (ref 70–99)
POTASSIUM: 3.9 mmol/L (ref 3.5–5.1)
SODIUM: 137 mmol/L (ref 135–145)
Total Bilirubin: 0.7 mg/dL (ref 0.3–1.2)
Total Protein: 6.3 g/dL (ref 6.0–8.3)

## 2014-09-06 LAB — SAMPLE TO BLOOD BANK

## 2014-09-06 LAB — CDS SEROLOGY

## 2014-09-06 LAB — PROTIME-INR
INR: 0.98 (ref 0.00–1.49)
Prothrombin Time: 13.1 seconds (ref 11.6–15.2)

## 2014-09-06 LAB — ETHANOL

## 2014-09-06 MED ORDER — HYDROCODONE-ACETAMINOPHEN 5-325 MG PO TABS: 1.0000 | ORAL_TABLET | ORAL | Status: DC | PRN

## 2014-09-06 MED ORDER — MORPHINE SULFATE 4 MG/ML IJ SOLN: 6.0000 mg | Freq: Once | INTRAMUSCULAR | Status: DC

## 2014-09-06 MED ORDER — FENTANYL CITRATE (PF) 100 MCG/2ML IJ SOLN
INTRAMUSCULAR | Status: AC
  Filled 2014-09-06: qty 2

## 2014-09-06 MED ORDER — IOHEXOL 300 MG/ML  SOLN
100.0000 mL | Freq: Once | INTRAMUSCULAR | Status: AC | PRN
  Administered 2014-09-06: 100 mL via INTRAVENOUS

## 2014-09-06 MED ORDER — FENTANYL CITRATE (PF) 100 MCG/2ML IJ SOLN
50.0000 ug | Freq: Once | INTRAMUSCULAR | Status: AC
Start: 2014-09-06 — End: 2014-09-06
  Administered 2014-09-06: 50 ug via INTRAVENOUS
  Filled 2014-09-06: qty 2

## 2014-09-06 MED ORDER — SODIUM CHLORIDE 0.9 % IV SOLN
Freq: Once | INTRAVENOUS | Status: AC
  Administered 2014-09-06: 09:00:00 via INTRAVENOUS

## 2014-09-06 MED ORDER — FENTANYL CITRATE (PF) 100 MCG/2ML IJ SOLN
50.0000 ug | Freq: Once | INTRAMUSCULAR | Status: AC
Start: 2014-09-06 — End: 2014-09-06
  Administered 2014-09-06: 50 ug via INTRAVENOUS

## 2014-09-06 MED ORDER — FENTANYL CITRATE (PF) 100 MCG/2ML IJ SOLN
100.0000 ug | Freq: Once | INTRAMUSCULAR | Status: AC
  Administered 2014-09-06: 100 ug via INTRAVENOUS
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
## 2014-09-06 NOTE — ED Notes (Signed)
Pt ambulated with assist, noted to be unsteady on feet, but able to ambulate.  Dr Vonna Kotykjay at bedside.

## 2014-09-06 NOTE — ED Notes (Signed)
Pt still in radiology. Family back in the room at this time.
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
## 2014-09-06 NOTE — ED Notes (Signed)
Pt taken to CT at this time.

## 2014-09-07 NOTE — Consult Note (Signed)
Chief Complaint:  Subjective/Chief Complaint Patient with pancreatitis on CT. Continues to have pain. Was walking around the floor earlier and appeared to be pai free. States we wants to go home and wants to eat.   VITAL SIGNS/ANCILLARY NOTES: **Vital Signs.:   07-Oct-14 14:23  Vital Signs Type Routine  Temperature Temperature (F) 97.7  Celsius 36.5  Temperature Source oral  Pulse Pulse 77  Respirations Respirations 20  Systolic BP Systolic BP 118  Diastolic BP (mmHg) Diastolic BP (mmHg) 53  Mean BP 74  Pulse Ox % Pulse Ox % 94  Pulse Ox Activity Level  At rest  Oxygen Delivery Room Air/ 21 %   Brief Assessment:  GEN well developed, well nourished   Respiratory normal resp effort   Gastrointestinal details normal Soft  Tender   Radiology Results: CT:    06-Oct-14 14:57, CT Abdomen With Contrast  CT Abdomen With Contrast   REASON FOR EXAM:    acute pancreatitis. pt has lot of abdo pain still;      NOTE: Nursing to Give Oral  COMMENTS:       PROCEDURE: CT  - CT ABDOMEN STANDARD W  - Feb 20 2013  2:57PM     RESULT: History: Pancreatitis    Comparison: None    Technique: Multiple axial images of the abdomen were performed from the   lung bases to the iliac crests, with p.o. contrast and with 100 ml of   Isovue 370 intravenous contrast.    Findings:    The lung bases are clear. There is no pneumothorax. The heart size is   normal.     The liver is diffusely low in attenuation as can be seen with hepatic   steatosis. There is no intrahepatic or extrahepatic biliary ductal   dilatation. The gallbladder is unremarkable. The spleen demonstrates no   focal abnormality. The kidneys and adrenal glands are normal.     There is peripancreatic inflammatory change around the pancreatic tail   consistent with pancreatitis. There is no focal fluid collection. The   pancreas enhances homogeneously.    The visualized portionsof the stomach, duodenum, small intestine, and      large intestine demonstrate no contrast extravasation or dilatation.   There is no pneumoperitoneum, pneumatosis, or portal venous gas. There is   no abdominal free fluid. There is no lymphadenopathy.     The abdominal aorta is normal in caliber with atherosclerosis.    The osseous structures are unremarkable.    IMPRESSION:     1. The peripancreatic inflammatory changes adjacent to the pancreatic   tail most consistent with acute pancreatitis. No focal fluid collection   to suggest an abscess or pseudocyst.    Dictation Site: 1    Verified By: Joellyn HaffHETAL P. PATEL, M.D., MD   Assessment/Plan:  Assessment/Plan:  Assessment Pancreatitis.   Plan The patient has pancreatitis seen on the CT scan. Will start clear liquids. Switch over to PO pain meds. If does not improve then consider MRCP for possible pancreatic duct stone.   Electronic Signatures: Midge MiniumWohl, Chapel Silverthorn (MD)  (Signed 07-Oct-14 17:41)  Authored: Chief Complaint, VITAL SIGNS/ANCILLARY NOTES, Brief Assessment, Radiology Results, Assessment/Plan   Last Updated: 07-Oct-14 17:41 by Midge MiniumWohl, Rosa Wyly (MD)

## 2014-09-07 NOTE — Consult Note (Signed)
Chief Complaint:  Subjective/Chief Complaint Pt continues to have 4/10 upper abdominal pain that radiates to RLQ.  c/o "lots of bloating & gas".  Having 2 liquid BMs daily.  Denies nausea or vomiting.  Tolerating clear liquids, but needed IV pain meds through the night.  Wants to go home.   VITAL SIGNS/ANCILLARY NOTES: **Vital Signs.:   08-Oct-14 05:01  Temperature Temperature (F) 97.3  Celsius 36.2  Temperature Source oral  Pulse Pulse 81  Respirations Respirations 18  Systolic BP Systolic BP 352  Diastolic BP (mmHg) Diastolic BP (mmHg) 91  Mean BP 100  Pulse Ox % Pulse Ox % 93  Pulse Ox Activity Level  At rest  Oxygen Delivery Room Air/ 21 %   Brief Assessment:  GEN well developed, no acute distress, obese, A/Ox3.   Cardiac Regular   Respiratory normal resp effort   Gastrointestinal details normal Soft  No rigidity  +tympany, decreased BS, +mild LUQ & RLQ tenderness on light palpation   EXTR negative cyanosis/clubbing, negative edema   Additional Physical Exam Skin: Pink, warm, dry   Lab Results: Routine Chem:  08-Oct-14 00:53   Creatinine (comp) 1.28  eGFR (African American) >60  eGFR (Non-African American) >60 (eGFR values <62mL/min/1.73 m2 may be an indication of chronic kidney disease (CKD). Calculated eGFR is useful in patients with stable renal function. The eGFR calculation will not be reliable in acutely ill patients when serum creatinine is changing rapidly. It is not useful in  patients on dialysis. The eGFR calculation may not be applicable to patients at the low and high extremes of body sizes, pregnant women, and vegetarians.)   Assessment/Plan:  Assessment/Plan:  Assessment Mild pancreatitis (tail) likely secondary to hypertriglyceridemia:  Slow improvement without complicating features on CT.  Cannot r/o occult microlithiasis.  Tolerating clears.  Still requiring IV pain meds. Loose stools:  likely secondary to NPO & clear liquids, but will check c  diff & initiate enteric precautions   Plan 1) Aggressive triglyceride management 2) Once pt able to tolerate PO pain meds only, consider DC home 3) If no significant improvement, consider MRCP to look for occult choledocholithiasis 4) c diff PCR 5) enteric precautions  6) Pt encouraged to sit in chair & ambulate today Please call with any questions or concerns.   Electronic Signatures: Andria Meuse (NP)  (Signed 08-Oct-14 09:04)  Authored: Chief Complaint, VITAL SIGNS/ANCILLARY NOTES, Brief Assessment, Lab Results, Assessment/Plan   Last Updated: 08-Oct-14 09:04 by Andria Meuse (NP)

## 2014-09-07 NOTE — Consult Note (Signed)
PATIENT NAME:  Trevor Torres, Trevor Torres MR#:  [id number] DATE OF BIRTH:  May 26, 1970  DATE OF CONSULTATION:  02/20/2013  HOSPITALIST/REQUESTING PHYSICIAN: Dr. Posey Pronto  PRIMARY CARE PHYSICIAN: Previously Dr. Heber Enola GASTROENTEROLOGIST: Lucilla Lame, MD/Rashena Dowling Ronnald Ramp, NP  REASON FOR CONSULTATION: Acute pancreatitis.   HISTORY OF PRESENT ILLNESS: Mr. Crum is a 44 year old Caucasian male who was in his usual state of health 4 days ago. He had lunch. He says he became very distended and "full of gas." He began to develop upper abdominal pain which radiated to his back. He then began to have nausea but denies any vomiting. He was admitted with a lipase of 1725, is down to 362 today. He does have history of hypertriglyceridemia, and triglycerides were 1108 upon admission, down to 538 today. His white blood cell count is 12.7.  He did have a trial of chicken broth, however, he developed severe postprandial abdominal pain. Therefore, Dr. Posey Pronto has ordered a CT scan for today. CT scan of abdomen and pelvis with IV and oral contrast upon admission showed pancreatitis in the tail of his pancreas as well as fatty liver. Upon admission his AST was 45 and LFTs were otherwise normal.  His total protein was 8.7.  He is scheduled to have a gastric sleeve next month. He rarely consumes alcohol, denies any heavy alcohol history or new medications. On 07/23, he had an ultrasound of his gallbladder which showed a 6.7 mm polyp on the nondependent aspect of the gallbladder wall and a normal 4.2 mm common bile duct. He has plans for cholecystectomy at the time of gastric bypass by Dr. Darnell Level.   PAST MEDICAL AND SURGICAL HISTORY: Hypertriglyceridemia, obesity, hypertension, hypothyroidism, chronic back pain.   MEDICATIONS PRIOR TO ADMISSION: Alprazolam 0.5 mg p.r.n. anxiety and sleep, amlodipine 5 mg daily, fluoxetine 40 mg at bedtime, levothyroxine 125 mcg daily, lisinopril 40 mg daily.   ALLERGIES: No known allergies.   FAMILY HISTORY:  There is no known family history of colon carcinoma, liver or chronic GI problems. There is family history of diabetes mellitus, coronary artery disease.   SOCIAL HISTORY: He rarely consumes alcohol, 1 or 2 beers a week. He is self-employed as a Merchandiser, retail. He denies any illicit drug use or tobacco use. He is married. He has 78 healthy 42-year-old son.   REVIEW OF SYSTEMS: See HPI.  CONSTITUTIONAL: He has malaise, fatigue, otherwise negative 12-point review of systems.   PHYSICAL EXAMINATION: VITAL SIGNS: Temperature 98.1, pulse 80, respirations 159/97, O2 sat 97% on room air.  GENERAL: He is an obese Caucasian male who is alert, oriented, pleasant and cooperative and appears in moderate distress.  HEAD, EYES, EARS, NOSE, AND THROAT: Sclerae are clear, anicteric. Conjunctivae pink. Oropharynx pink and moist without any lesions.  NECK: Supple without any mass or thyromegaly.  CHEST: Heart regular rate and rhythm. Normal S1, S2 without murmurs, clicks, rubs or gallops.  LUNGS: Clear to auscultation bilaterally.  ABDOMEN: He has faint bowel sounds with tympany, and he has moderate tenderness to upper abdomen and left upper quadrant on deep palpation.  He has rebound tenderness but no guarding.  Unable to palpate hepatosplenomegaly or mass given patient's body habitus.  EXTREMITIES: Without clubbing or edema.  SKIN: Pink, warm and dry without any rash or jaundice.  PSYCHIATRIC: He  is alert, cooperative, normal mood and affect.  MUSCULOSKELETAL: Good equal strength and movement bilaterally.  NEUROLOGIC: Grossly intact.   LABORATORY STUDIES: Glucose 103, otherwise normal Met-7.  A1c 5.6. Hemoglobin 13.8, hematocrit 38.7,  platelets 263.   IMPRESSION: Mr. Pelzel is a pleasant 45 year old Caucasian male with hypertriglyceridemia, obesity, hypertension, hypothyroidism and history of 6.7 mm gallbladder polyp on ultrasound 07/14, who presented 4 days ago with acute pancreatitis. He has been slow to  recover and failed clear liquid diet trial. He had a CT pending today for re-evaluation since he has been slow to recover. On exam, he had some faint bowel sounds with tympany and ileus was suspected, however, CT scan showed no worsening necrotizing pancreatitis, pseudocyst or abscess. He also has fatty liver. I have discussed his care with Dr. Lucilla Lame.     PLAN: 1.  Agree with aggressive IV fluids and supportive measures.  2.  Aggressive triglyceride management.  3.  The patient plans on gastric sleeve and cholecystectomy at the time of procedures with Dr. Darnell Level in the near future.  4.  We will continue to follow him with you.  5.  Hopefully, he will be able to tolerate clear liquids soon; if not, we may need to consider alternative nutritional means such as Dobhoff.    Thank you for allowing [place] to participate in the care of Mr. Scherman.   ____________________________ Andria Meuse, NP klj:cb D: 02/20/2013 19:58:48 ET T: 02/20/2013 20:12:46 ET JOB#: [id number]  cc: Andria Meuse, NP, <Dictator> Andria Meuse FNP ELECTRONICALLY SIGNED 03/08/2013 9:55

## 2014-09-07 NOTE — Op Note (Signed)
PATIENT NAME:  Trevor EmeryHAWN, [name] MR#:  [id number] DATE OF BIRTH:  26-Sep-1970  DATE OF PROCEDURE:  04/04/2013  PREOPERATIVE DIAGNOSES: Morbid obesity, gallbladder polyps.   POSTOPERATIVE DIAGNOSIS: Morbid obesity, gallbladder polyps.   PROCEDURE: Laparoscopic sleeve gastrectomy  SURGEON: Primus BravoJon Bruce, M.D.   ASSISTANT: Mariella SaaSarah Stout, PA.   COMPLICATIONS: None.   SPECIMENS: Portion of the stomach.   ESTIMATED BLOOD LOSS: None.   ANESTHESIA:  General endotracheal.  INDICATION:  See History and Physical.   DETAILS OF PROCEDURE:  The patient was taken to the operating room and placed on the operating room table, in the supine position, with appropriate monitors and supplemental oxygen being delivered.  Broad spectrum IV antibiotics were administered. The patient was placed under general anesthesia without incident.  The abdomen was prepped and draped in the usual sterile fashion.  Access was obtained using 5 mm Optical trocar. Pneumoperitoneum was established without difficulty. Multiple other ports were placed in preparation for sleeve gastrectomy. A liver retractor was placed without incident. The entire stomach was mobilized from 5 cm from the pylorus all the way up to the fundus, and the fundus was mobilized off the left crura as well completely freeing up the posterior portion of the stomach. Posterior attachments were taken down so the crura could be visualized from both sides.  At that point, everything was removed from the stomach and a 84[address] [place] Bougie was placed down into the antrum. An Echelon green load stapler was used to bisect the antrum on first fire starting approximately 5 to 6 cm from the pylorus. I then continued up along the Bougie using a blue load stapler with excellent affect with care not to get too close to the Bougie itself, with minimal traction. This continued all the way up to the left crura. The excess stomach was placed on the side and the Bougie was removed and endoscopy  showed no evidence of obstruction at that time.  The excess stomach was removed through the abdominal cavity, and the wounds were closed using 4-0 Vicryl and Dermabond.    ____________________________ Primus BravoJon Bruce, MD jb:np D: 04/04/2013 15:36:47 ET T: 04/04/2013 16:35:55 ET JOB#: [id number]  cc: Primus BravoJon Bruce, MD, <Dictator> Cletis AthensJON Marjean DonnaM BRUCE MD ELECTRONICALLY SIGNED 04/18/2013 15:30

## 2014-09-07 NOTE — H&P (Signed)
PATIENT NAME:  Trevor Torres, Trevor Torres MR#:  [id number] DATE OF BIRTH:  [birth year]  DATE OF ADMISSION:  02/17/2013  PRIMARY CARE PHYSICIAN: None. Used to see Dr. Veneda Melter who has moved to [place].   CHIEF COMPLAINT:  Abdominal pain, nausea.   HISTORY OF PRESENTING ILLNESS: A 44 year old male patient with history of hypertriglyceridemia, hypertension and hypothyroidism presents to the Emergency Room sent from urgent care with acute onset of abdominal pain of 1 day. The patient has had nausea but no vomiting, no diarrhea or constipation. He never had similar or as severe abdominal pain. Here, the patient has been found to have a lipase elevated at 1700 with a CT scan of the abdomen showing pancreatitis with intolerable pain in spite of IV medications and the patient is being admitted. The patient used to take gemfibrozil and fish oil in the past, but has stopped taking them due to side effects. The patient last remembers his triglyceride level to be at 1100 three months back and he has to check him for his health insurance.   PAST MEDICAL HISTORY: 1.  Hypertriglyceridemia.  2.  Hypertension.  3.  Hypothyroidism.   PAST SURGICAL HISTORY: None.   SOCIAL HISTORY: The patient does not smoke. Occasional alcohol. Last alcohol drink was 2 weeks back. No illicit drugs. Lives at home with his wife.   ALLERGIES: No known drug allergies.   FAMILY HISTORY: No pancreatitis in the family. Heart disease in his parents.   HOME MEDICATIONS: Include:  1.  Lisinopril 40 mg daily.  2.  Lipitor 5 mg daily.  3.  Levothyroxine 125 mcg daily.  4.  Fluoxetine 40 mg daily.  5.  Alprazolam 0.5 mg oral at bedtime for insomnia as needed.   REVIEW OF SYSTEMS:  CONSTITUTIONAL: Complains of fatigue. No fever.  EYES: No blurred vision, pain or redness.  ENT: No tinnitus, ear pain, hearing loss.  RESPIRATORY: No cough, wheeze, hemoptysis.  CARDIOVASCULAR: No chest pain, orthopnea, or edema. GASTROINTESTINAL:  Has  nausea. No vomiting or diarrhea. Has abdominal pain.  GENITOURINARY: No dysuria, hematuria, frequency.  ENDOCRINE: No polyuria, nocturia. Has thyroid problems.  HEMATOLOGY AND LYMPHATIC: No anemia, easy bruising, bleeding.  INTEGUMENTARY: No acne, rash, lesions.  MUSCULOSKELETAL: No back pain, arthritis.  NEUROLOGIC: No focal numbness, weakness or seizures. PSYCHIATRY:  No anxiety or depression.   PHYSICAL EXAMINATION: VITAL SIGNS: Temperature 98.6, pulse 109, blood pressure 161/88, saturating 97% on room air.  GENERAL: Morbidly obese Caucasian male patient lying in bed in significant distress secondary to his abdominal pain.  PSYCHIATRIC: Alert and oriented x 3. Mood and affect appropriate. Judgment intact.  HEENT: Atraumatic, normocephalic. Oral mucosa dry and pink. External ears and nose normal. No pallor. No icterus. Pupils bilaterally equal and reactive to light.  NECK: Supple. No thyromegaly or palpable lymph nodes. Trachea midline. No carotid bruit, JVD.  CARDIOVASCULAR: S1, S2, without any murmurs. Peripheral pulses 2+. No edema.  RESPIRATORY: Normal work of breathing. Clear to auscultation on both sides.  GASTROINTESTINAL: Soft abdomen, tenderness in the epigastric and left upper quadrant area, but diffusely tender on deep palpation. No rigidity or guarding. Bowel sounds present. No hepatosplenomegaly palpable.  SKIN: Warm and dry. No petechiae, rash, ulcers.  GENITOURINARY: No CVA tenderness or bladder distention.  MUSCULOSKELETAL: No joint swelling or redness, effusion of the large joints. Normal muscle tone.  NEUROLOGICAL: Motor strength 5/5 in upper and lower extremities. Sensation to fine touch is intact all over.   LYMPHATICS:  No cervical lymphadenopathy.  LABORATORY STUDIES: Glucose of 142, BUN 11, creatinine 1.18, sodium 138, potassium 4.2 , lipase of 1725, AST, ALT, alkaline phosphatase, bilirubin normal. CBC shows WBC 12.7, hemoglobin 16, platelets of 315.   CT scan of  the abdomen done today shows pancreatitis and hepatic steatosis.   ASSESSMENT AND PLAN: 1.  Acute pancreatitis likely secondary from hypertriglyceridemia. The patient's last known triglyceride levels were 1100. Will check a stat lipid profile. If his triglycerides are elevated, the patient will be started on insulin with D5. Also check a TSH with a history of hypothyroidism as this can cause hypertriglyceridemia. We will start him on intravenous fluids, pain medications, nothing by mouth except medications and close monitoring. No gallstones found on the CT scan. We will repeat a lipase in the morning. We will also start him on gemfibrozil and fish oil.  2.  Hypertension, uncontrolled. Likely secondary from the pain. Continue home medications. Use IV medications as needed.  3.  Hyponatremia secondary to dehydration. Start him on intravenous fluids.  4.  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 Will need follow-up with endocrinology. The patient is going to get bariatric surgery soon with one of the surgeons. This has been scheduled. This will help his triglyceride levels and blood pressure. 5.  Deep venous thrombosis prophylaxis with Lovenox.  CODE STATUS: FULL CODE.   TIME SPENT TODAY ON THIS CASE: 45 minutes.    ____________________________ Molinda BailiffSrikar R. Issabela Lesko, MD srs:dp D: 02/17/2013 17:08:57 ET T: 02/17/2013 18:00:05 ET JOB#: [id number]  cc: Wardell HeathSrikar R. Haru Shaff, MD, <Dictator> Orie FishermanSRIKAR R Nehemias Sauceda MD ELECTRONICALLY SIGNED 02/19/2013 2:37

## 2014-09-07 NOTE — Discharge Summary (Signed)
PATIENT NAME:  [name], Trevor J MR#:  [id number] DATE OF BIRTH:  April 04, 1971  DATE OF ADMISSION:  02/17/2013 DATE OF DISCHARGE:  02/22/2013  PRESENTING COMPLAINT: Abdominal pain.   DISCHARGE DIAGNOSES: 1.  Acute pancreatitis secondary to hypertriglyceridemia.  2.  Hypertension.  3.  Diarrhea.  4.  Morbid obesity.   CODE STATUS: FULL CODE.   DISCHARGE MEDICATIONS: 1.  Lisinopril 40 mg p.o. daily.  2.  Amlodipine 5 mg p.o. daily.  3.  Levothyroxine 125 mcg p.o. daily.  4.  Fluoxetine 40 mg at bedtime.  5.  Alprazolam 0.5 mg once a day as needed for anxiety.  6.  Acetaminophen/oxycodone 325/10 mg 2 tablets every 6 hours.  7.  Gemfibrozil 600 mg 1 tablet b.i.d.  8.  Omega-3 fatty acid 2 grams orally once a day.   DIET: Clear liquid. Dietary supplement: Ensure clear. Clear liquid diet. Advance to full liquid when pain under better control.   DISCHARGE INSTRUCTIONS:  Follow up with Dr. Servando SnareWohl, GI, in 1 to 2 weeks. The patient advised to establish with primary care physician in the area.   LABORATORY AND DIAGNOSTICS: C. diff negative.   Repeat CT abdomen October 6th shows peripancreatic inflammatory changes adjacent to the pancreatic tail most consistent with acute pancreatitis.   Lipase is 200. Triglycerides 484. White count is 10.8, H and H are 13.8 and 38.7. Comprehensive metabolic panel within normal limits, except SGOT of 44. Hemoglobin A1c is 5.6. Triglycerides on admission was 1108, cholesterol 349 and creatinine 1.05.   CONSULTATIONS: GI with Dr. Midge Miniumarren Wohl.   BRIEF SUMMARY OF HOSPITAL COURSE: Trevor RatelMatthew Torres is a 44 year old Caucasian gentleman with history of hypertension and morbid obesity who comes in with:  1.  Acute pancreatitis which is suspected due to hypertriglyceridemia. The patient came in with triglycerides of 1108. He was started on insulin drip with D5 normal saline for 24 hours and triglycerides came down to 438. His lipase normalized as well. He was placed on  gemfibrozil and fish oil. Repeat CT was done on 10/06 given his ongoing abdominal pain; however, there were no other complications related to the pancreas. He was given IV morphine, around the clock, along with Percocet scheduled. The patient was started on clear liquids and was tolerating it okay with some pain. He is advised to advance his diet to full liquid as his pain gets better. Dr. Annabell SabalWohl's input was appreciated and discussed at length the disease process with the patient.  2.  Abdominal pain due to acute pancreatitis. The patient was given p.o. Percocet at discharge.  3.  Hypertension, well controlled.  4.  Hyponatremia, resolved.  5.  Diarrhea. Could be due to pancreatitis and/or due to fish oil. C. diff was negative.   Hospital stay otherwise remained stable. The patient remained a FULL CODE.   TIME SPENT: 40 minutes.  ____________________________ Wylie HailSona A. Allena KatzPatel, MD sap:sb D: 02/23/2013 07:12:58 ET T: 02/23/2013 07:24:02 ET JOB#: [id number]  cc: Whitfield Dulay A. Allena KatzPatel, MD, <Dictator> Midge Miniumarren Wohl, MD Willow OraSONA A Nafisa Olds MD ELECTRONICALLY SIGNED 03/10/2013 15:01

## 2014-09-07 NOTE — Consult Note (Signed)
Brief Consult Note: Diagnosis: Acute pancreatitis.   Patient was seen by consultant.   Consult note dictated.   Comments: Mr. Trevor Torres is a pleasant 44 y/o caucasian male with hypertriglyceridemia, obesity, HTN, hypothyroidism, & hx 6.357mm gallbladder polyp on US 7/14 who presented 4 days ago with acute pancreatitis.  He has been slow to recover & failed clear liquid trial.  CT pending for reevaluation today.  On exam faint bowel sounds with tympany, suspect ileus.  He also has fatty liver.  I will discuss his care with Dr Midge Miniumarren WOHL.  Plan: 1) NPO 2) Agree with aggressive IVFs, supportive measures 3) FU CT scan 4) aggressive triglyceride management 5) Pt plans on gastric sleeve & cholecystectomy at time of procedure with Dr Smitty CordsBruce in near future  Thanks for consult.  Please see full dictated note [phone].  Electronic Signatures: Joselyn ArrowJones, Demian Maisel L (NP)  (Signed 06-Oct-14 19:51)  Authored: Brief Consult Note   Last Updated: 06-Oct-14 19:51 by Joselyn ArrowJones, Ahmiya Abee L (NP)

## 2014-12-15 ENCOUNTER — Ambulatory Visit: Payer: 59

## 2014-12-15 ENCOUNTER — Encounter: Payer: Self-pay | Admitting: Gynecology

## 2014-12-15 ENCOUNTER — Ambulatory Visit
Admission: EM | Admit: 2014-12-15 | Discharge: 2014-12-15 | Disposition: A | Discharge status: Home / Self Care | Payer: 59 | Attending: Emergency Medicine | Admitting: Emergency Medicine

## 2014-12-15 DIAGNOSIS — Z79899 Other long term (current) drug therapy: Secondary | ICD-10-CM | POA: Insufficient documentation

## 2014-12-15 DIAGNOSIS — L02512 Cutaneous abscess of left hand: Secondary | ICD-10-CM | POA: Insufficient documentation

## 2014-12-15 DIAGNOSIS — L0291 Cutaneous abscess, unspecified: Secondary | ICD-10-CM

## 2014-12-15 DIAGNOSIS — L0390 Cellulitis, unspecified: Secondary | ICD-10-CM

## 2014-12-15 DIAGNOSIS — R21 Rash and other nonspecific skin eruption: Secondary | ICD-10-CM | POA: Diagnosis present

## 2014-12-15 DIAGNOSIS — M79641 Pain in right hand: Secondary | ICD-10-CM | POA: Diagnosis present

## 2014-12-15 MED ORDER — CEFTRIAXONE SODIUM 1 G IJ SOLR
1.0000 g | Freq: Once | INTRAMUSCULAR | Status: AC
  Administered 2014-12-15: 1 g via INTRAMUSCULAR

## 2014-12-15 MED ORDER — MUPIROCIN 2 % EX OINT: 1.0000 "application " | TOPICAL_OINTMENT | Freq: Two times a day (BID) | CUTANEOUS | Status: DC

## 2014-12-15 MED ORDER — KETOROLAC TROMETHAMINE 60 MG/2ML IM SOLN
60.0000 mg | Freq: Once | INTRAMUSCULAR | Status: AC
  Administered 2014-12-15: 60 mg via INTRAMUSCULAR

## 2014-12-15 MED ORDER — ACETAMINOPHEN-CODEINE 300-30 MG PO TABS: 1.0000 | ORAL_TABLET | Freq: Four times a day (QID) | ORAL | Status: DC | PRN

## 2014-12-15 MED ORDER — SULFAMETHOXAZOLE-TRIMETHOPRIM 800-160 MG PO TABS: 1.0000 | ORAL_TABLET | Freq: Two times a day (BID) | ORAL | Status: DC

## 2014-12-15 MED ORDER — ASPIRIN 81 MG PO CHEW: 324.0000 mg | CHEWABLE_TABLET | Freq: Once | ORAL | Status: DC

## 2014-12-15 NOTE — ED Notes (Signed)
Patient stated x 6 days left pinky finger with a bump. Per pt. Try to squeeze at it but nothing came come. Per pt. Left pinky finger now swollen / redness and look infected.

## 2014-12-15 NOTE — Discharge Instructions (Signed)
Abscess °An abscess is an infected area that contains a collection of pus and debris. It can occur in almost any part of the body. An abscess is also known as a furuncle or boil. °CAUSES  °An abscess occurs when tissue gets infected. This can occur from blockage of oil or sweat glands, infection of hair follicles, or a minor injury to the skin. As the body tries to fight the infection, pus collects in the area and creates pressure under the skin. This pressure causes pain. People with weakened immune systems have difficulty fighting infections and get certain abscesses more often.  °SYMPTOMS °Usually an abscess develops on the skin and becomes a painful mass that is red, warm, and tender. If the abscess forms under the skin, you may feel a moveable soft area under the skin. Some abscesses break open (rupture) on their own, but most will continue to get worse without care. The infection can spread deeper into the body and eventually into the bloodstream, causing you to feel ill.  °DIAGNOSIS  °Your caregiver will take your medical history and perform a physical exam. A sample of fluid may also be taken from the abscess to determine what is causing your infection. °TREATMENT  °Your caregiver may prescribe antibiotic medicines to fight the infection. However, taking antibiotics alone usually does not cure an abscess. Your caregiver may need to make a small cut (incision) in the abscess to drain the pus. In some cases, gauze is packed into the abscess to reduce pain and to continue draining the area. °HOME CARE INSTRUCTIONS  °· Only take over-the-counter or prescription medicines for pain, discomfort, or fever as directed by your caregiver. °· If you were prescribed antibiotics, take them as directed. Finish them even if you start to feel better. °· If gauze is used, follow your caregiver's directions for changing the gauze. °· To avoid spreading the infection: °· Keep your draining abscess covered with a  bandage. °· Wash your hands well. °· Do not share personal care items, towels, or whirlpools with others. °· Avoid skin contact with others. °· Keep your skin and clothes clean around the abscess. °· Keep all follow-up appointments as directed by your caregiver. °SEEK MEDICAL CARE IF:  °· You have increased pain, swelling, redness, fluid drainage, or bleeding. °· You have muscle aches, chills, or a general ill feeling. °· You have a fever. °MAKE SURE YOU:  °· Understand these instructions. °· Will watch your condition. °· Will get help right away if you are not doing well or get worse. °Document Released: 02/11/2005 Document Revised: 11/03/2011 Document Reviewed: 07/17/2011 °ExitCare® Patient Information ©2015 [employer]. This information is not intended to replace advice given to you by your health care provider. Make sure you discuss any questions you have with your health care provider. ° °Abscess °Care After °An abscess (also called a boil or furuncle) is an infected area that contains a collection of pus. Signs and symptoms of an abscess include pain, tenderness, redness, or hardness, or you may feel a moveable soft area under your skin. An abscess can occur anywhere in the body. The infection may spread to surrounding tissues causing cellulitis. A cut (incision) by the surgeon was made over your abscess and the pus was drained out. Gauze may have been packed into the space to provide a drain that will allow the cavity to heal from the inside outwards. The boil may be painful for 5 to 7 days. Most people with a boil do not have   high fevers. Your abscess, if seen early, may not have localized, and may not have been lanced. If not, another appointment may be required for this if it does not get better on its own or with medications. °HOME CARE INSTRUCTIONS  °· Only take over-the-counter or prescription medicines for pain, discomfort, or fever as directed by your caregiver. °· When you bathe, soak and then  remove gauze or iodoform packs at least daily or as directed by your caregiver. You may then wash the wound gently with mild soapy water. Repack with gauze or do as your caregiver directs. °SEEK IMMEDIATE MEDICAL CARE IF:  °· You develop increased pain, swelling, redness, drainage, or bleeding in the wound site. °· You develop signs of generalized infection including muscle aches, chills, fever, or a general ill feeling. °· An oral temperature above 102° F (38.9° C) develops, not controlled by medication. °See your caregiver for a recheck if you develop any of the symptoms described above. If medications (antibiotics) were prescribed, take them as directed. °Document Released: 11/20/2004 Document Revised: 07/27/2011 Document Reviewed: 07/18/2007 °ExitCare® Patient Information ©2015 [employer]. This information is not intended to replace advice given to you by your health care provider. Make sure you discuss any questions you have with your health care provider. ° °Cellulitis °Cellulitis is an infection of the skin and the tissue beneath it. The infected area is usually red and tender. Cellulitis occurs most often in the arms and lower legs.  °CAUSES  °Cellulitis is caused by bacteria that enter the skin through cracks or cuts in the skin. The most common types of bacteria that cause cellulitis are staphylococci and streptococci. °SIGNS AND SYMPTOMS  °· Redness and warmth. °· Swelling. °· Tenderness or pain. °· Fever. °DIAGNOSIS  °Your health care provider can usually determine what is wrong based on a physical exam. Blood tests may also be done. °TREATMENT  °Treatment usually involves taking an antibiotic medicine. °HOME CARE INSTRUCTIONS  °· Take your antibiotic medicine as directed by your health care provider. Finish the antibiotic even if you start to feel better. °· Keep the infected arm or leg elevated to reduce swelling. °· Apply a warm cloth to the affected area up to 4 times per day to relieve  pain. °· Take medicines only as directed by your health care provider. °· Keep all follow-up visits as directed by your health care provider. °SEEK MEDICAL CARE IF:  °· You notice red streaks coming from the infected area. °· Your red area gets larger or turns dark in color. °· Your bone or joint underneath the infected area becomes painful after the skin has healed. °· Your infection returns in the same area or another area. °· You notice a swollen bump in the infected area. °· You develop new symptoms. °· You have a fever. °SEEK IMMEDIATE MEDICAL CARE IF:  °· You feel very sleepy. °· You develop vomiting or diarrhea. °· You have a general ill feeling (malaise) with muscle aches and pains. °MAKE SURE YOU:  °· Understand these instructions. °· Will watch your condition. °· Will get help right away if you are not doing well or get worse. °Document Released: 02/11/2005 Document Revised: 09/18/2013 Document Reviewed: 07/20/2011 °ExitCare® Patient Information ©2015 [employer]. This information is not intended to replace advice given to you by your health care provider. Make sure you discuss any questions you have with your health care provider. ° °Incision and Drainage °Incision and drainage is a procedure in which a sac-like   structure (cystic structure) is opened and drained. The area to be drained usually contains material such as pus, fluid, or blood.  °LET YOUR CAREGIVER KNOW ABOUT:  °· Allergies to medicine. °· Medicines taken, including vitamins, herbs, eyedrops, over-the-counter medicines, and creams. °· Use of steroids (by mouth or creams). °· Previous problems with anesthetics or numbing medicines. °· History of bleeding problems or blood clots. °· Previous surgery. °· Other health problems, including diabetes and kidney problems. °· Possibility of pregnancy, if this applies. °RISKS AND COMPLICATIONS °· Pain. °· Bleeding. °· Scarring. °· Infection. °BEFORE THE PROCEDURE  °You may need to have an ultrasound  or other imaging tests to see how large or deep your cystic structure is. Blood tests may also be used to determine if you have an infection or how severe the infection is. You may need to have a tetanus shot. °PROCEDURE  °The affected area is cleaned with a cleaning fluid. The cyst area will then be numbed with a medicine (local anesthetic). A small incision will be made in the cystic structure. A syringe or catheter may be used to drain the contents of the cystic structure, or the contents may be squeezed out. The area will then be flushed with a cleansing solution. After cleansing the area, it is often gently packed with a gauze or another wound dressing. Once it is packed, it will be covered with gauze and tape or some other type of wound dressing.  °AFTER THE PROCEDURE  °· Often, you will be allowed to go home right after the procedure. °· You may be given antibiotic medicine to prevent or heal an infection. °· If the area was packed with gauze or some other wound dressing, you will likely need to come back in 1 to 2 days to get it removed. °· The area should heal in about 14 days. °Document Released: 10/28/2000 Document Revised: 11/03/2011 Document Reviewed: 06/29/2011 °[employer]® Patient Information ©2015 [employer]. This information is not intended to replace advice given to you by your health care provider. Make sure you discuss any questions you have with your health care provider. ° °
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
## 2014-12-16 NOTE — ED Notes (Signed)
Trevor Torres called and he said his wife could not test the woudn cx at [facility] and would we please run it here. Specimen reordered and sent to lab.

## 2014-12-17 ENCOUNTER — Ambulatory Visit
Admission: EM | Admit: 2014-12-17 | Discharge: 2014-12-17 | Disposition: A | Discharge status: Home / Self Care | Payer: 59 | Attending: Internal Medicine | Admitting: Internal Medicine

## 2014-12-17 DIAGNOSIS — L03129 Acute lymphangitis of unspecified part of limb: Secondary | ICD-10-CM

## 2014-12-17 DIAGNOSIS — Z4801 Encounter for change or removal of surgical wound dressing: Secondary | ICD-10-CM

## 2014-12-17 DIAGNOSIS — L02512 Cutaneous abscess of left hand: Secondary | ICD-10-CM

## 2014-12-17 NOTE — ED Provider Notes (Addendum)
CSN: [id number]     Arrival date & time 12/17/14  1852 History   First MD Initiated Contact with Patient 12/17/14 1918     Chief Complaint  Patient presents with  . Abscess   (Consider location/radiation/quality/duration/timing/severity/associated sxs/prior Treatment) HPI Comments: Married caucasian male seen 05 Dec 2014 left hand abscess.  Given shot of rocephin in clinic and started on bactrim DS po BID and bactroban 2% ointment BID with dressing changes.  Wound culture pending.  Feeling bad today swelling to elbow now minimal discharge from I&D incisions pustules noted under skin again whole hand tight/red today.  The history is provided by the patient.    History reviewed. No pertinent past medical history. Past Surgical History  Procedure Laterality Date  . Laparoscopic gastric sleeve resection     Family History  Problem Relation Age of Onset  . Diabetes Mother   . Hypertension Mother   . Cancer Mother   . Cancer Father    History  Substance Use Topics  . Smoking status: Never Smoker   . Smokeless tobacco: Not on file  . Alcohol Use: Yes     Comment: socially    Review of Systems  Constitutional: Positive for chills and fatigue. Negative for fever, diaphoresis, activity change and appetite change.  HENT: Negative for congestion, dental problem, drooling, ear discharge, ear pain and facial swelling.   Eyes: Negative for photophobia, pain, discharge, redness and itching.  Respiratory: Negative for cough, shortness of breath, wheezing and stridor.   Cardiovascular: Negative for chest pain and leg swelling.  Gastrointestinal: Negative for nausea, vomiting, abdominal pain and diarrhea.  Endocrine: Negative for cold intolerance and heat intolerance.  Genitourinary: Negative for dysuria and hematuria.  Musculoskeletal: Positive for myalgias, joint swelling and arthralgias. Negative for back pain, gait problem, neck pain and neck stiffness.  Skin: Positive for color change and  rash. Negative for pallor and wound.  Allergic/Immunologic: Positive for environmental allergies. Negative for food allergies.  Neurological: Negative for dizziness, tremors, seizures, syncope, facial asymmetry, speech difficulty, weakness, light-headedness, numbness and headaches.  Hematological: Negative for adenopathy. Does not bruise/bleed easily.  Psychiatric/Behavioral: Positive for sleep disturbance. Negative for behavioral problems, confusion and agitation.    Allergies  Nsaids  Home Medications   Prior to Admission medications   Medication Sig Start Date End Date Taking? Authorizing Provider  Acetaminophen-Codeine (TYLENOL/CODEINE #3) 300-30 MG per tablet Take 1 tablet by mouth every 6 (six) hours as needed for pain. 12/15/14  Yes Barbaraann Barthel, NP  busPIRone (BUSPAR) 15 MG tablet Take 15 mg by mouth daily.  08/09/14  Yes Historical Provider, MD  LORazepam (ATIVAN) 1 MG tablet Take 1 mg by mouth at bedtime. 08/07/14  Yes Historical Provider, MD  mupirocin ointment (BACTROBAN) 2 % Apply 1 application topically 2 (two) times daily. Right hand/finger lesions x 7-10 days 12/15/14  Yes Barbaraann Barthel, NP  sulfamethoxazole-trimethoprim (BACTRIM DS,SEPTRA DS) 800-160 MG per tablet Take 1 tablet by mouth 2 (two) times daily. 12/15/14  Yes Barbaraann Barthel, NP  venlafaxine XR (EFFEXOR-XR) 150 MG 24 hr capsule Take 150 mg by mouth daily. 08/20/14  Yes Historical Provider, MD   BP 141/106 mmHg  Pulse 86  Temp(Src) 98.6 F (37 C) (Oral)  Resp 18  Ht  (1.753 m)  Wt 241 lb (109.317 kg)  BMI 35.57 kg/m2  SpO2 100% Physical Exam  Constitutional: He is oriented to person, place, and time. Vital signs are normal. He appears well-developed and well-nourished. No  distress.  HENT:  Head: Normocephalic and atraumatic.  Right Ear: External ear normal.  Left Ear: External ear normal.  Nose: Nose normal.  Mouth/Throat: Oropharynx is clear and moist. No oropharyngeal exudate.  Eyes:  Conjunctivae, EOM and lids are normal. Pupils are equal, round, and reactive to light. Right eye exhibits no discharge. Left eye exhibits no discharge. No scleral icterus.  Neck: Trachea normal and normal range of motion. Neck supple. No tracheal deviation present.  Cardiovascular: Normal rate, regular rhythm and intact distal pulses.   Pulmonary/Chest: Effort normal and breath sounds normal. No stridor. No respiratory distress. He has no wheezes.  Abdominal: Soft.  Musculoskeletal: He exhibits edema and tenderness.       Right shoulder: Normal.       Left shoulder: Normal.       Cervical back: Normal.       Right upper arm: Normal.       Right forearm: Normal.       Left forearm: He exhibits tenderness, swelling and edema. He exhibits no bony tenderness, no deformity and no laceration.       Arms:      Right hand: Normal.       Left hand: He exhibits decreased range of motion, tenderness and swelling. He exhibits no bony tenderness, normal two-point discrimination, normal capillary refill, no deformity and no laceration. Normal sensation noted. Normal strength noted.       Hands: Neurological: He is alert and oriented to person, place, and time. No cranial nerve deficit. He exhibits normal muscle tone. Coordination normal.  Skin: Skin is warm, dry and intact. Lesion and rash noted. No abrasion, no bruising, no burn, no ecchymosis, no laceration, no petechiae and no purpura noted. Rash is macular and pustular. Rash is not papular, not maculopapular, not nodular, not vesicular and not urticarial. He is not diaphoretic. There is erythema. No cyanosis. No pallor. Nails show no clubbing.  Psychiatric: He has a normal mood and affect. His speech is normal and behavior is normal. Judgment and thought content normal. Cognition and memory are normal.  Nursing note and vitals reviewed.   ED Course  Procedures (including critical care time) Labs Review Labs Reviewed - No data to display  Imaging  Review No results found.  1926 report called to  [facility] ER charge nurse.  Patient agreed to transfer to [facility] ER after I spoke with Dr Martha Clan orthopedics on call for [facility] today recommended hand surgeon evaluation.  Patient verbalized understanding of information/instructions, agreed with plan of care and had no further questions at this time. MDM   1. Abscess of left hand   2. Acute lymphangitis of forearm    To [facility] ER now for hand surgeon evaluation.  Failed rocephin IM, bactrim DS po BID since 30 Jul and bactroban 2% topical BID.  Wound culture sensitivities pending organism staph aureus.  Discussed with patient probable IV antibiotics possible OR washout by orthopedics hand surgeon at [facility].  Patient to follow up per orthopedics instructions.  Will call once wound culture results available.  Patient verbalized understanding of information/instructions, agreed with plan of care and had no further questions at this time.    Barbaraann Barthel, NP 12/17/14 1943  Barbaraann Barthel, NP 12/19/14 1428  Audry Riles MD [facility] hospitalist contacted clinic and discussed wound culture results ID and sensitivities.  21 Dec 2014 at 0930  Barbaraann Barthel, NP 12/21/14 [phone]

## 2014-12-17 NOTE — ED Notes (Signed)
Seen here at [facility] Saturday and I&D of Abscess at base of left 5th finger. No major improvement and woke today with entire left hand and forearm swelling and small drainage from abscess. Pain up to elbow.

## 2014-12-17 NOTE — Discharge Instructions (Signed)

## 2014-12-18 ENCOUNTER — Emergency Department
Admission: EM | Admit: 2014-12-18 | Discharge: 2014-12-18 | Disposition: A | Discharge status: Other Acute Inpatient Hospital | Payer: 59 | Attending: Emergency Medicine | Admitting: Emergency Medicine

## 2014-12-18 ENCOUNTER — Encounter: Payer: Self-pay | Admitting: Emergency Medicine

## 2014-12-18 DIAGNOSIS — Z792 Long term (current) use of antibiotics: Secondary | ICD-10-CM | POA: Insufficient documentation

## 2014-12-18 DIAGNOSIS — Z79899 Other long term (current) drug therapy: Secondary | ICD-10-CM | POA: Insufficient documentation

## 2014-12-18 DIAGNOSIS — L03114 Cellulitis of left upper limb: Secondary | ICD-10-CM | POA: Diagnosis not present

## 2014-12-18 DIAGNOSIS — L089 Local infection of the skin and subcutaneous tissue, unspecified: Secondary | ICD-10-CM | POA: Insufficient documentation

## 2014-12-18 DIAGNOSIS — Y9289 Other specified places as the place of occurrence of the external cause: Secondary | ICD-10-CM | POA: Insufficient documentation

## 2014-12-18 DIAGNOSIS — Y9389 Activity, other specified: Secondary | ICD-10-CM | POA: Diagnosis not present

## 2014-12-18 DIAGNOSIS — S60921D Unspecified superficial injury of right hand, subsequent encounter: Secondary | ICD-10-CM

## 2014-12-18 DIAGNOSIS — S60921A Unspecified superficial injury of right hand, initial encounter: Secondary | ICD-10-CM | POA: Diagnosis not present

## 2014-12-18 DIAGNOSIS — Y998 Other external cause status: Secondary | ICD-10-CM | POA: Insufficient documentation

## 2014-12-18 DIAGNOSIS — W57XXXA Bitten or stung by nonvenomous insect and other nonvenomous arthropods, initial encounter: Secondary | ICD-10-CM | POA: Insufficient documentation

## 2014-12-18 DIAGNOSIS — S60561A Insect bite (nonvenomous) of right hand, initial encounter: Secondary | ICD-10-CM | POA: Diagnosis present

## 2014-12-18 LAB — CBC
HCT: 37.9 % — ABNORMAL LOW (ref 40.0–52.0)
Hemoglobin: 13.2 g/dL (ref 13.0–18.0)
MCH: 32.9 pg (ref 26.0–34.0)
MCHC: 34.8 g/dL (ref 32.0–36.0)
MCV: 94.4 fL (ref 80.0–100.0)
Platelets: 287 10*3/uL (ref 150–440)
RBC: 4.02 MIL/uL — AB (ref 4.40–5.90)
RDW: 13.8 % (ref 11.5–14.5)
WBC: 8.5 10*3/uL (ref 3.8–10.6)

## 2014-12-18 LAB — BASIC METABOLIC PANEL
ANION GAP: 8 (ref 5–15)
BUN: 9 mg/dL (ref 6–20)
CALCIUM: 9.1 mg/dL (ref 8.9–10.3)
CO2: 26 mmol/L (ref 22–32)
Chloride: 105 mmol/L (ref 101–111)
Creatinine, Ser: 1.17 mg/dL (ref 0.61–1.24)
GFR calc non Af Amer: >60 mL/min (ref >60)
Glucose, Bld: 94 mg/dL (ref 65–99)
POTASSIUM: 4.3 mmol/L (ref 3.5–5.1)
SODIUM: 139 mmol/L (ref 135–145)

## 2014-12-18 MED ORDER — PIPERACILLIN-TAZOBACTAM 3.375 G IVPB
3.3750 g | Freq: Once | INTRAVENOUS | Status: DC
  Filled 2014-12-18: qty 50

## 2014-12-18 MED ORDER — MORPHINE SULFATE 4 MG/ML IJ SOLN
4.0000 mg | Freq: Once | INTRAMUSCULAR | Status: AC
  Administered 2014-12-18: 4 mg via INTRAVENOUS
  Filled 2014-12-18: qty 1

## 2014-12-18 MED ORDER — VANCOMYCIN HCL IN DEXTROSE 1-5 GM/200ML-% IV SOLN
1000.0000 mg | Freq: Once | INTRAVENOUS | Status: AC
  Administered 2014-12-18: 1000 mg via INTRAVENOUS
  Filled 2014-12-18: qty 200

## 2014-12-18 MED ORDER — ONDANSETRON HCL 4 MG/2ML IJ SOLN
4.0000 mg | Freq: Once | INTRAMUSCULAR | Status: AC
  Administered 2014-12-18: 4 mg via INTRAVENOUS
  Filled 2014-12-18: qty 2

## 2014-12-18 MED ORDER — SODIUM CHLORIDE 0.9 % IV BOLUS (SEPSIS)
1000.0000 mL | Freq: Once | INTRAVENOUS | Status: AC
  Administered 2014-12-18: 1000 mL via INTRAVENOUS

## 2014-12-18 NOTE — ED Provider Notes (Signed)
[facility] Emergency Department Provider Note  ____________________________________________  Time seen: Approximately 509 AM  I have reviewed the triage vital signs and the nursing notes.   HISTORY  Chief Complaint Insect Bite    HPI Trevor Torres is a 44 y.o. male who comes in today with swelling and redness of his hand. The patient reports that he thinks he obtained a spider bite on his hand multiple days ago. He reports that he ignored it for 6 days and then developed some swelling and what appeared to be an abscess. The patient reports that he went to the walk-in clinic where they gave him a shot of antibiotic, and oral antibiotic and attempted to drain it. The patient reports that he was doing well initially but over the weekend the swelling returned and the pain returned. The patient reports that he went back to the walk-in clinic yesterday and they told him that he needed to go to [facility] for further evaluation by a surgeon. The patient reports that he went to [facility] in the waiting room was very full. The patient reports that he waited for multiple hours but was unable to be seen so he left the ER at [facility] and came back here for evaluation. The patient denies any fevers reports that he has some pain especially with moving his hand around. Been taking Bactrim for his pain.The patient reports this pain is a 9 out of 10 in intensity.   History reviewed. No pertinent past medical history.  There are no active problems to display for this patient.   Past Surgical History  Procedure Laterality Date  . Laparoscopic gastric sleeve resection      Current Outpatient Rx  Name  Route  Sig  Dispense  Refill  . busPIRone (BUSPAR) 15 MG tablet   Oral   Take 15 mg by mouth daily.       3   . LORazepam (ATIVAN) 1 MG tablet   Oral   Take 1 mg by mouth at bedtime.      3   . mupirocin ointment (BACTROBAN) 2 %   Topical   Apply 1 application topically 2 (two) times  daily. Right hand/finger lesions x 7-10 days   22 g   0   . sulfamethoxazole-trimethoprim (BACTRIM DS,SEPTRA DS) 800-160 MG per tablet   Oral   Take 1 tablet by mouth 2 (two) times daily.   14 tablet   0   . venlafaxine XR (EFFEXOR-XR) 150 MG 24 hr capsule   Oral   Take 150 mg by mouth daily.         . Acetaminophen-Codeine (TYLENOL/CODEINE #3) 300-30 MG per tablet   Oral   Take 1 tablet by mouth every 6 (six) hours as needed for pain.   6 tablet   0     Allergies Nsaids  Family History  Problem Relation Age of Onset  . Diabetes Mother   . Hypertension Mother   . Cancer Mother   . Cancer Father     Social History History  Substance Use Topics  . Smoking status: Never Smoker   . Smokeless tobacco: Not on file  . Alcohol Use: Yes     Comment: socially    Review of Systems Constitutional: No fever/chills Eyes: No visual changes. ENT: No sore throat. Cardiovascular: Denies chest pain. Respiratory: Denies shortness of breath. Gastrointestinal: No abdominal pain.  No nausea, no vomiting.  No diarrhea.  No constipation. Genitourinary: Negative for dysuria. Musculoskeletal:  Left hand swelling Skin: Erythema and swelling to left hand, erythema tracking down left arm Neurological: Negative for headaches, focal weakness or numbness.  10-point ROS otherwise negative.  ____________________________________________   PHYSICAL EXAM:  VITAL SIGNS: ED Triage Vitals  Enc Vitals Group     BP 12/18/14 0440 150/115 mmHg     Pulse Rate 12/18/14 0440 74     Resp 12/18/14 0440 18     Temp 12/18/14 0440 98.2 F (36.8 C)     Temp Source 12/18/14 0440 Oral     SpO2 12/18/14 0440 100 %     Weight 12/18/14 0440 239 lb (108.41 kg)     Height 12/18/14 0440  (1.753 m)     Head Cir --      Peak Flow --      Pain Score 12/18/14 0441 9     Pain Loc --      Pain Edu? --      Excl. in GC? --     Constitutional: Alert and oriented. Well appearing and in moderate  distress. Eyes: Conjunctivae are normal. PERRL. EOMI. Head: Atraumatic. Nose: No congestion/rhinnorhea. Mouth/Throat: Mucous membranes are moist.  Oropharynx non-erythematous. Cardiovascular: Normal rate, regular rhythm. Grossly normal heart sounds.  Good peripheral circulation. Respiratory: Normal respiratory effort.  No retractions. Lungs CTAB. Gastrointestinal: Soft and nontender. No distention. Positive bowel sounds Genitourinary: Deferred Musculoskeletal: swelling to hand and arm Neurologic:  Normal speech and language.  Skin:  Erythema to left hand with streaking down arm. Psychiatric: Mood and affect are normal.   ____________________________________________   LABS (all labs ordered are listed, but only abnormal results are displayed)  Labs Reviewed  CBC - Abnormal; Notable for the following:    RBC 4.02 (*)    HCT 37.9 (*)    All other components within normal limits  BASIC METABOLIC PANEL   ____________________________________________  EKG  none ____________________________________________  RADIOLOGY  none ____________________________________________   PROCEDURES  Procedure(s) performed: None  Critical Care performed: No  ____________________________________________   INITIAL IMPRESSION / ASSESSMENT AND PLAN / ED COURSE  Pertinent labs & imaging results that were available during my care of the patient were reviewed by me and considered in my medical decision making (see chart for details).  The patient is a 44 year old male who comes in today with some swelling and pain and erythema to his left hand. The patient was seen at urgent care and it was felt that the patient needed to be seen by surgery for possible washout of his hand. Given the amount of swelling ethyl of the patient does need IV antibiotics and a consult by a hand surgeon. I will contact [facility] to see they are able to admit him for evaluation and IV antibiotics.  The patient will be  transferred to [facility] for evaluation by hand surgery. Their recommendation was to give Zosyn as well for possible polymicrobial infection. ____________________________________________   FINAL CLINICAL IMPRESSION(S) / ED DIAGNOSES  Final diagnoses:  Injury, superficial, hand with infection, right, subsequent encounter  Cellulitis of left upper extremity      Rebecka Apley, MD 12/18/14 [phone]

## 2014-12-18 NOTE — ED Notes (Signed)
Patient ambulatory to triage with steady gait, without difficulty or distress noted; pt reports ?bite to left hand week ago; seen at [facility] Saturday for I&D and received antibiotic inj and rx; returned there last night and was sent to [facility] ED but left due to long wait; swelling/redness noted

## 2014-12-18 NOTE — ED Notes (Signed)
Pt resting comfortably,in no apparent distress,  wife at bedside. Pt to be transferred to [facility] ED for further treatment of insect bite to left hand. Left hand swollen with quarter size ulceration noted between thumb and index finger, no drainage noted. Saline lock to right AC intact. Report called to Lanora Manis, RN at [facility] ED by this Clinical research associate.

## 2014-12-19 LAB — WOUND CULTURE

## 2014-12-31 ENCOUNTER — Emergency Department
Admission: EM | Admit: 2014-12-31 | Discharge: 2015-01-01 | Disposition: A | Discharge status: Other Acute Inpatient Hospital | Payer: 59 | Attending: Emergency Medicine | Admitting: Emergency Medicine

## 2014-12-31 ENCOUNTER — Emergency Department: Payer: 59

## 2014-12-31 DIAGNOSIS — Y9289 Other specified places as the place of occurrence of the external cause: Secondary | ICD-10-CM | POA: Diagnosis not present

## 2014-12-31 DIAGNOSIS — T391X2A Poisoning by 4-Aminophenol derivatives, intentional self-harm, initial encounter: Secondary | ICD-10-CM | POA: Diagnosis not present

## 2014-12-31 DIAGNOSIS — R45851 Suicidal ideations: Secondary | ICD-10-CM | POA: Diagnosis present

## 2014-12-31 DIAGNOSIS — Y9389 Activity, other specified: Secondary | ICD-10-CM | POA: Diagnosis not present

## 2014-12-31 DIAGNOSIS — F329 Major depressive disorder, single episode, unspecified: Secondary | ICD-10-CM | POA: Diagnosis not present

## 2014-12-31 DIAGNOSIS — Z79899 Other long term (current) drug therapy: Secondary | ICD-10-CM | POA: Insufficient documentation

## 2014-12-31 DIAGNOSIS — Y998 Other external cause status: Secondary | ICD-10-CM | POA: Diagnosis not present

## 2014-12-31 DIAGNOSIS — F1310 Sedative, hypnotic or anxiolytic abuse, uncomplicated: Secondary | ICD-10-CM | POA: Insufficient documentation

## 2014-12-31 LAB — CBC WITH DIFFERENTIAL/PLATELET
BASOS ABS: 0.1 10*3/uL (ref 0–0.1)
BASOS PCT: 1 %
EOS ABS: 0.2 10*3/uL (ref 0–0.7)
Eosinophils Relative: 2 %
HEMATOCRIT: 48.5 % (ref 40.0–52.0)
Hemoglobin: 16.5 g/dL (ref 13.0–18.0)
Lymphocytes Relative: 22 %
Lymphs Abs: 2.8 10*3/uL (ref 1.0–3.6)
MCH: 31.9 pg (ref 26.0–34.0)
MCHC: 34.1 g/dL (ref 32.0–36.0)
MCV: 93.6 fL (ref 80.0–100.0)
MONOS PCT: 8 %
Monocytes Absolute: 1 10*3/uL (ref 0.2–1.0)
NEUTROS ABS: 8.5 10*3/uL — AB (ref 1.4–6.5)
NEUTROS PCT: 67 %
Platelets: 420 10*3/uL (ref 150–440)
RBC: 5.18 MIL/uL (ref 4.40–5.90)
RDW: 13.7 % (ref 11.5–14.5)
WBC: 12.5 10*3/uL — ABNORMAL HIGH (ref 3.8–10.6)

## 2014-12-31 LAB — COMPREHENSIVE METABOLIC PANEL
ALT: 16 U/L — ABNORMAL LOW (ref 17–63)
ANION GAP: 11 (ref 5–15)
AST: 31 U/L (ref 15–41)
Albumin: 5.7 g/dL — ABNORMAL HIGH (ref 3.5–5.0)
Alkaline Phosphatase: 55 U/L (ref 38–126)
BILIRUBIN TOTAL: 1.1 mg/dL (ref 0.3–1.2)
BUN: 13 mg/dL (ref 6–20)
CALCIUM: 9.8 mg/dL (ref 8.9–10.3)
CO2: 23 mmol/L (ref 22–32)
Chloride: 101 mmol/L (ref 101–111)
Creatinine, Ser: 1.31 mg/dL — ABNORMAL HIGH (ref 0.61–1.24)
GFR calc Af Amer: >60 mL/min (ref >60)
Glucose, Bld: 100 mg/dL — ABNORMAL HIGH (ref 65–99)
Potassium: 3.8 mmol/L (ref 3.5–5.1)
Sodium: 135 mmol/L (ref 135–145)
TOTAL PROTEIN: 9 g/dL — AB (ref 6.5–8.1)

## 2014-12-31 LAB — URINALYSIS COMPLETE WITH MICROSCOPIC (ARMC ONLY)
BILIRUBIN URINE: NEGATIVE
Bacteria, UA: NONE SEEN
GLUCOSE, UA: NEGATIVE mg/dL
Hgb urine dipstick: NEGATIVE
Leukocytes, UA: NEGATIVE
Nitrite: NEGATIVE
Protein, ur: 30 mg/dL — AB
SQUAMOUS EPITHELIAL / LPF: NONE SEEN
Specific Gravity, Urine: 1.023 (ref 1.005–1.030)
pH: 5 (ref 5.0–8.0)

## 2014-12-31 LAB — ACETAMINOPHEN LEVEL: Acetaminophen (Tylenol), Serum: <10 ug/mL — ABNORMAL LOW (ref 10–30)

## 2014-12-31 LAB — URINE DRUG SCREEN, QUALITATIVE (ARMC ONLY)
AMPHETAMINES, UR SCREEN: NOT DETECTED
Barbiturates, Ur Screen: NOT DETECTED
Benzodiazepine, Ur Scrn: POSITIVE — AB
COCAINE METABOLITE, UR ~~LOC~~: NOT DETECTED
Cannabinoid 50 Ng, Ur ~~LOC~~: NOT DETECTED
MDMA (Ecstasy)Ur Screen: NOT DETECTED
METHADONE SCREEN, URINE: NOT DETECTED
OPIATE, UR SCREEN: NOT DETECTED
PHENCYCLIDINE (PCP) UR S: NOT DETECTED
Tricyclic, Ur Screen: POSITIVE — AB

## 2014-12-31 LAB — PROTIME-INR
INR: 1.07
PROTHROMBIN TIME: 14.1 s (ref 11.4–15.0)

## 2014-12-31 LAB — SALICYLATE LEVEL: Salicylate Lvl: <4 mg/dL (ref 2.8–30.0)

## 2014-12-31 LAB — ETHANOL: ALCOHOL ETHYL (B): 6 mg/dL — AB (ref <5)

## 2014-12-31 NOTE — ED Notes (Signed)
Pt suicidal, recent separation from wife.

## 2014-12-31 NOTE — ED Notes (Signed)
Pt having c/o cramping  to left side of chest NSR on the monitor. MD ordering repeat EKG.
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
## 2014-12-31 NOTE — BH Assessment (Signed)
Assessment Note  Trevor Torres is an 44 y.o. male. Trevor Torres reports to the ED with thoughts of suicide.  He reports that he has been under stress and is currently going through a separation from his wife.  He states that "I have been very angry for the last year, very sad and very angry".  He reports being depressed and sad.  He denied symptoms of anxiety.  He denied having auditory or visual hallucinations.  He denied homicidal ideation or intent.  He reports that he is having suicidal thoughts.  He reports that he has had these thoughts in the past, but tonight he did not feel safe with the thoughts.  It has been reported that Trevor Torres has taken 45 tylenol pm tablets over the past 3 days.  Poison control has been contacted.  Axis I: Depressive Disorder NOS Axis II: Deferred Axis III: No past medical history on file. Axis IV: economic problems and problems with primary support group Axis V: 11-20 some danger of hurting self or others possible OR occasionally fails to maintain minimal personal hygiene OR gross impairment in communication  Past Medical History: No past medical history on file.  Past Surgical History  Procedure Laterality Date  . Laparoscopic gastric sleeve resection      Family History:  Family History  Problem Relation Age of Onset  . Diabetes Mother   . Hypertension Mother   . Cancer Mother   . Cancer Father     Social History:  reports that he has never smoked. He does not have any smokeless tobacco history on file. He reports that he drinks alcohol. He reports that he does not use illicit drugs.  Additional Social History:  Alcohol / Drug Use History of alcohol / drug use?: No history of alcohol / drug abuse  CIWA: CIWA-Ar BP: (!) 158/120 mmHg Pulse Rate: (!) 102 COWS:    Allergies:  Allergies  Allergen Reactions  . Nsaids Other (See Comments)    Can not have due to gastric sleeve surgery.    Home Medications:  (Not in a hospital admission)  OB/GYN  Status:  No LMP for male patient.  General Assessment Data Location of Assessment: [facility] ED TTS Assessment: In system Is this a Tele or Face-to-Face Assessment?: Face-to-Face Is this an Initial Assessment or a Re-assessment for this encounter?: Initial Assessment Marital status: Married [place] name: n/a Is patient pregnant?: No Living Arrangements: Parent (mother) Can pt return to current living arrangement?: Yes Admission Status: Involuntary Is patient capable of signing voluntary admission?: Yes Referral Source: Self/Family/Friend Insurance type: [place] [facility]  Medical Screening Exam [facility] Walk-in ONLY) Medical Exam completed: Yes  Crisis Care Plan Living Arrangements: Parent (mother) Name of Psychiatrist: Dr. Edwin Dada - [facility] in [place] Name of Therapist: None  Education Status Is patient currently in school?: No Current Grade: n/a Highest grade of school patient has completed: 10th Name of school: [employer] person: na  Risk to self with the past 6 months Suicidal Ideation: Yes-Currently Present Has patient been a risk to self within the past 6 months prior to admission? : Yes Suicidal Intent: No-Not Currently/Within Last 6 Months Has patient had any suicidal intent within the past 6 months prior to admission? : Yes Is patient at risk for suicide?: Yes Suicidal Plan?: Yes-Currently Present Has patient had any suicidal plan within the past 6 months prior to admission? : Yes Specify Current Suicidal Plan: Overdose Access to Means: Yes Specify Access  to Suicidal Means: Overdose What has been your use of drugs/alcohol within the last 12 months?: None Previous Attempts/Gestures: No (Denied) How many times?: 0 Other Self Harm Risks: None reported Triggers for Past Attempts: Family contact Intentional Self Injurious Behavior: None Family Suicide History: No Recent stressful life event(s): Conflict (Comment) (Family seperation  ) Persecutory voices/beliefs?: No Depression: Yes Depression Symptoms: Insomnia, Despondent, Feeling angry/irritable Substance abuse history and/or treatment for substance abuse?: No Suicide prevention information given to non-admitted patients: Not applicable  Risk to Others within the past 6 months Homicidal Ideation: No Does patient have any lifetime risk of violence toward others beyond the six months prior to admission? : No Thoughts of Harm to Others: No Current Homicidal Intent: No Current Homicidal Plan: No Access to Homicidal Means: No Identified Victim: None reported History of harm to others?: No Assessment of Violence: On admission Violent Behavior Description: None reported Does patient have access to weapons?: No Criminal Charges Pending?: No Does patient have a court date: No Is patient on probation?: No  Psychosis Hallucinations: None noted Delusions: None noted  Mental Status Report Appearance/Hygiene: In scrubs, Unremarkable Eye Contact: Fair Motor Activity: Restlessness Speech: Soft Level of Consciousness: Alert Mood: Worthless, low self-esteem Affect: Flat, Sad Anxiety Level: Minimal Thought Processes: Coherent Judgement: Partial Orientation: Person, Place, Time, Situation Obsessive Compulsive Thoughts/Behaviors: None  Cognitive Functioning Concentration: Normal Appetite: Poor  ADLScreening [facility] Assessment Services) Patient's cognitive ability adequate to safely complete daily activities?: Yes Patient able to express need for assistance with ADLs?: Yes Independently performs ADLs?: Yes (appropriate for developmental age)  Prior Inpatient Therapy Prior Inpatient Therapy: No  Prior Outpatient Therapy Prior Outpatient Therapy:  (Psychiatric Services only) Does patient have an ACCT team?: No Does patient have Intensive In-House Services?  : No Does patient have [facility] services? : No Does patient have P4CC services?: No  ADL Screening  (condition at time of admission) Patient's cognitive ability adequate to safely complete daily activities?: Yes Patient able to express need for assistance with ADLs?: Yes Independently performs ADLs?: Yes (appropriate for developmental age)       Abuse/Neglect Assessment (Assessment to be complete while patient is alone) Physical Abuse: Denies Verbal Abuse: Denies Sexual Abuse: Denies Exploitation of patient/patient's resources: Denies Self-Neglect: Denies Values / Beliefs Cultural Requests During Hospitalization: None Spiritual Requests During Hospitalization: None        Additional Information 1:1 In Past 12 Months?: No CIRT Risk: No Elopement Risk: Yes     Disposition:  Disposition Initial Assessment Completed for this Encounter: Yes Disposition of Patient:  (To be assessed by the psychiatrist)  On Site Evaluation by:   Reviewed with Physician:    Justice Deeds 12/31/2014 10:24 PM

## 2015-01-01 ENCOUNTER — Encounter: Payer: Self-pay | Admitting: Occupational Medicine

## 2015-01-01 ENCOUNTER — Inpatient Hospital Stay
Admission: EM | Admit: 2015-01-01 | Discharge: 2015-01-07 | DRG: 885 | Disposition: A | Discharge status: Home / Self Care | Payer: 59 | Source: Intra-hospital | Attending: Psychiatry | Admitting: Psychiatry

## 2015-01-01 DIAGNOSIS — T391X2A Poisoning by 4-Aminophenol derivatives, intentional self-harm, initial encounter: Secondary | ICD-10-CM | POA: Diagnosis not present

## 2015-01-01 DIAGNOSIS — Z915 Personal history of self-harm: Secondary | ICD-10-CM

## 2015-01-01 DIAGNOSIS — E039 Hypothyroidism, unspecified: Secondary | ICD-10-CM | POA: Diagnosis present

## 2015-01-01 DIAGNOSIS — Z833 Family history of diabetes mellitus: Secondary | ICD-10-CM | POA: Diagnosis not present

## 2015-01-01 DIAGNOSIS — F1110 Opioid abuse, uncomplicated: Secondary | ICD-10-CM | POA: Diagnosis present

## 2015-01-01 DIAGNOSIS — F419 Anxiety disorder, unspecified: Secondary | ICD-10-CM | POA: Diagnosis present

## 2015-01-01 DIAGNOSIS — S069X9A Unspecified intracranial injury with loss of consciousness of unspecified duration, initial encounter: Secondary | ICD-10-CM

## 2015-01-01 DIAGNOSIS — Z888 Allergy status to other drugs, medicaments and biological substances status: Secondary | ICD-10-CM | POA: Diagnosis not present

## 2015-01-01 DIAGNOSIS — G4700 Insomnia, unspecified: Secondary | ICD-10-CM | POA: Diagnosis present

## 2015-01-01 DIAGNOSIS — F13939 Sedative, hypnotic or anxiolytic use, unspecified with withdrawal, unspecified: Secondary | ICD-10-CM

## 2015-01-01 DIAGNOSIS — Z79899 Other long term (current) drug therapy: Secondary | ICD-10-CM

## 2015-01-01 DIAGNOSIS — E222 Syndrome of inappropriate secretion of antidiuretic hormone: Secondary | ICD-10-CM

## 2015-01-01 DIAGNOSIS — R51 Headache: Secondary | ICD-10-CM | POA: Diagnosis present

## 2015-01-01 DIAGNOSIS — F1320 Sedative, hypnotic or anxiolytic dependence, uncomplicated: Secondary | ICD-10-CM

## 2015-01-01 DIAGNOSIS — R45851 Suicidal ideations: Secondary | ICD-10-CM | POA: Diagnosis present

## 2015-01-01 DIAGNOSIS — S069XAA Unspecified intracranial injury with loss of consciousness status unknown, initial encounter: Secondary | ICD-10-CM

## 2015-01-01 DIAGNOSIS — Z8249 Family history of ischemic heart disease and other diseases of the circulatory system: Secondary | ICD-10-CM | POA: Diagnosis not present

## 2015-01-01 DIAGNOSIS — F909 Attention-deficit hyperactivity disorder, unspecified type: Secondary | ICD-10-CM | POA: Diagnosis present

## 2015-01-01 DIAGNOSIS — I10 Essential (primary) hypertension: Secondary | ICD-10-CM | POA: Diagnosis present

## 2015-01-01 DIAGNOSIS — S46009A Unspecified injury of muscle(s) and tendon(s) of the rotator cuff of unspecified shoulder, initial encounter: Secondary | ICD-10-CM

## 2015-01-01 DIAGNOSIS — Z809 Family history of malignant neoplasm, unspecified: Secondary | ICD-10-CM | POA: Diagnosis not present

## 2015-01-01 DIAGNOSIS — F332 Major depressive disorder, recurrent severe without psychotic features: Secondary | ICD-10-CM | POA: Diagnosis present

## 2015-01-01 DIAGNOSIS — E538 Deficiency of other specified B group vitamins: Secondary | ICD-10-CM | POA: Diagnosis present

## 2015-01-01 DIAGNOSIS — Z9884 Bariatric surgery status: Secondary | ICD-10-CM | POA: Diagnosis not present

## 2015-01-01 DIAGNOSIS — F13239 Sedative, hypnotic or anxiolytic dependence with withdrawal, unspecified: Secondary | ICD-10-CM | POA: Diagnosis present

## 2015-01-01 DIAGNOSIS — F1390 Sedative, hypnotic, or anxiolytic use, unspecified, uncomplicated: Secondary | ICD-10-CM

## 2015-01-01 HISTORY — DX: Essential (primary) hypertension: I10

## 2015-01-01 LAB — SALICYLATE LEVEL

## 2015-01-01 LAB — ACETAMINOPHEN LEVEL

## 2015-01-01 MED ORDER — LORAZEPAM 1 MG PO TABS
ORAL_TABLET | ORAL | Status: AC
  Filled 2015-01-01: qty 1

## 2015-01-01 MED ORDER — TRAZODONE HCL 50 MG PO TABS
50.0000 mg | ORAL_TABLET | Freq: Once | ORAL | Status: AC
  Administered 2015-01-01: 50 mg via ORAL
  Filled 2015-01-01: qty 1

## 2015-01-01 MED ORDER — ALUM & MAG HYDROXIDE-SIMETH 200-200-20 MG/5ML PO SUSP: 30.0000 mL | ORAL | Status: DC | PRN

## 2015-01-01 MED ORDER — LORAZEPAM 1 MG PO TABS
1.0000 mg | ORAL_TABLET | Freq: Once | ORAL | Status: AC
  Administered 2015-01-01: 1 mg via ORAL

## 2015-01-01 MED ORDER — ACETAMINOPHEN 325 MG PO TABS: 650.0000 mg | ORAL_TABLET | Freq: Four times a day (QID) | ORAL | Status: DC | PRN

## 2015-01-01 MED ORDER — HYDROXYZINE HCL 25 MG PO TABS
50.0000 mg | ORAL_TABLET | Freq: Once | ORAL | Status: AC
  Administered 2015-01-01: 50 mg via ORAL

## 2015-01-01 MED ORDER — ONDANSETRON HCL 4 MG PO TABS
ORAL_TABLET | ORAL | Status: AC
  Administered 2015-01-01: 4 mg via ORAL
  Filled 2015-01-01: qty 1

## 2015-01-01 MED ORDER — VENLAFAXINE HCL ER 75 MG PO CP24
75.0000 mg | ORAL_CAPSULE | ORAL | Status: AC
  Administered 2015-01-01: 75 mg via ORAL

## 2015-01-01 MED ORDER — ONDANSETRON HCL 4 MG PO TABS
4.0000 mg | ORAL_TABLET | Freq: Once | ORAL | Status: AC
  Administered 2015-01-01: 4 mg via ORAL

## 2015-01-01 MED ORDER — HYDROXYZINE HCL 25 MG PO TABS
ORAL_TABLET | ORAL | Status: AC
  Administered 2015-01-01: 50 mg via ORAL
  Filled 2015-01-01: qty 2

## 2015-01-01 MED ORDER — LORAZEPAM 1 MG PO TABS
ORAL_TABLET | ORAL | Status: AC
  Administered 2015-01-01: 1 mg via ORAL
  Filled 2015-01-01: qty 1

## 2015-01-01 MED ORDER — MAGNESIUM HYDROXIDE 400 MG/5ML PO SUSP: 30.0000 mL | Freq: Every day | ORAL | Status: DC | PRN

## 2015-01-01 MED ORDER — VENLAFAXINE HCL ER 75 MG PO CP24
ORAL_CAPSULE | ORAL | Status: AC
  Administered 2015-01-01: 75 mg via ORAL
  Filled 2015-01-01: qty 1

## 2015-01-01 MED ORDER — TRAZODONE HCL 50 MG PO TABS: 50.0000 mg | ORAL_TABLET | Freq: Every day | ORAL | Status: DC

## 2015-01-01 NOTE — ED Notes (Signed)

## 2015-01-01 NOTE — ED Notes (Signed)
Pt given lunch tray.

## 2015-01-01 NOTE — ED Notes (Signed)
Spoke to posion control at lab levels and vitals normal ok to transfer to behavioral floor [facility]) if MD ok with it.

## 2015-01-01 NOTE — ED Notes (Signed)
BEHAVIORAL HEALTH ROUNDING Patient sleeping: Yes.   Patient alert and oriented: yes Behavior appropriate: Yes.  ; If no, describe:  Nutrition and fluids offered: Yes  Toileting and hygiene offered: Yes  Sitter present: yes Law enforcement present: Yes  

## 2015-01-01 NOTE — ED Notes (Signed)
BEHAVIORAL HEALTH ROUNDING Patient sleeping: No. Patient alert and oriented: yes Behavior appropriate: Yes.  ; If no, describe:  Nutrition and fluids offered: Yes  Toileting and hygiene offered: yes Sitter present: no Law enforcement present: Yes  

## 2015-01-01 NOTE — ED Notes (Signed)
Pt is very tearful wants to see son and wants to go home.  Tried to call report to behavioral unit unavailable.

## 2015-01-01 NOTE — BHH Counselor (Signed)
Trevor Torres called to inquire if Pt. Was still in ED and to ask about visitation hours. Writer confirmed through Pt chart that caller was on contact list and confirmed ED presence and visitation hours.

## 2015-01-01 NOTE — ED Notes (Signed)
BEHAVIORAL HEALTH ROUNDING Patient sleeping: No. Patient alert and oriented: yes Behavior appropriate: Yes.  ; If no, describe:  Nutrition and fluids offered: Yes  Toileting and hygiene offered: Yes  Sitter present: no Law enforcement present: Yes  

## 2015-01-01 NOTE — ED Notes (Signed)
BEHAVIORAL HEALTH ROUNDING Patient sleeping: Yes.   Patient alert and oriented: yes Behavior appropriate: Yes.  ; If no, describe:  Nutrition and fluids offered: sleeping Toileting and hygiene offered: sleeping Sitter present: yes Law enforcement present: Yes  

## 2015-01-01 NOTE — BHH Counselor (Signed)
Pt. is to be admitted to [facility] by Dr. Guss Bunde. Attending Physician will be Dr. Ardyth Harps.  Pt. has been assigned to room 324A, by [facility] Charge Nurse Marylu Lund.  Intake Paper Work has been signed and placed on pt. chart. ER staff Misty Stanley, ER. Sect. Huel Cote, ER MD; Lamar Sprinkles Patient's Nurse &  Patient Access) have been made aware of the admission.

## 2015-01-01 NOTE — ED Notes (Signed)
Renato Gails brought pt to ED pt reports cant take this anymore not sleeping pt states " i just dont want to wake up" pt has recently had separation from his wife. Pt has had SI thoughts none at this time. However pt reports he has taking 40-45 tabs of tylenol pm in the last 3 days. Pt states he is depressed and hopeless.

## 2015-01-01 NOTE — ED Notes (Signed)
BEHAVIORAL HEALTH ROUNDING Patient sleeping: Yes.   Patient alert and oriented: sleeping Behavior appropriate: Yes.  ; If no, describe: sleeping Nutrition and fluids offered: sleeping Toileting and hygiene offered: sleeping Sitter present: yes Law enforcement present: Yes  

## 2015-01-01 NOTE — Consult Note (Signed)
[facility] Face-to-Face Psychiatry Consult   Reason for Consult:  Follow up Referring Physician:  Er Patient Identification: Trevor Torres MRN:  [id number] Principal Diagnosis: <principal problem not specified> Diagnosis:  There are no active problems to display for this patient.   Total Time spent with patient: 1 hour  Subjective:   Trevor Torres is a 44 y.o. male patient admitted with depression with SI. Pt lost his job and his wife asked him to move out and she has a job and their 67 yr old son lives with her and pt moved in with  Parent.  HPI:  Feeling very depressed with suicidal wishes and came for hel. No previous H/O Inpt to Psychiatry. Not being followed by MH HPI Elements:     Past Medical History: History reviewed. No pertinent past medical history.  Past Surgical History  Procedure Laterality Date  . Laparoscopic gastric sleeve resection     Family History:  Family History  Problem Relation Age of Onset  . Diabetes Mother   . Hypertension Mother   . Cancer Mother   . Cancer Father    Social History:  History  Alcohol Use  . Yes    Comment: socially     History  Drug Use No    Social History   Social History  . Marital Status: Married    Spouse Name: N/A  . Number of Children: N/A  . Years of Education: N/A   Social History Main Topics  . Smoking status: Never Smoker   . Smokeless tobacco: None  . Alcohol Use: Yes     Comment: socially  . Drug Use: No  . Sexual Activity: Not Asked   Other Topics Concern  . None   Social History Narrative   Additional Social History:    History of alcohol / drug use?: No history of alcohol / drug abuse                     Allergies:   Allergies  Allergen Reactions  . Nsaids Other (See Comments)    Can not have due to gastric sleeve surgery.    Labs:  Results for orders placed or performed during the hospital encounter of 12/31/14 (from the past 48 hour(s))  Comprehensive metabolic panel      Status: Abnormal   Collection Time: 12/31/14  8:51 PM  Result Value Ref Range   Sodium 135 135 - 145 mmol/L   Potassium 3.8 3.5 - 5.1 mmol/L   Chloride 101 101 - 111 mmol/L   CO2 23 22 - 32 mmol/L   Glucose, Bld 100 (H) 65 - 99 mg/dL   BUN 13 6 - 20 mg/dL   Creatinine, Ser 2.73 (H) 0.61 - 1.24 mg/dL   Calcium 9.8 8.9 - 53.0 mg/dL   Total Protein 9.0 (H) 6.5 - 8.1 g/dL   Albumin 5.7 (H) 3.5 - 5.0 g/dL   AST 31 15 - 41 U/L   ALT 16 (L) 17 - 63 U/L   Alkaline Phosphatase 55 38 - 126 U/L   Total Bilirubin 1.1 0.3 - 1.2 mg/dL   GFR calc non Af Amer >60 >60 mL/min   GFR calc Af Amer >60 >60 mL/min    Comment: (NOTE) The eGFR has been calculated using the CKD EPI equation. This calculation has not been validated in all clinical situations. eGFR's persistently <60 mL/min signify possible Chronic Kidney Disease.    Anion gap 11 5 - 15  Ethanol  Status: Abnormal   Collection Time: 12/31/14  8:51 PM  Result Value Ref Range   Alcohol, Ethyl (B) 6 (H) <5 mg/dL    Comment:        LOWEST DETECTABLE LIMIT FOR SERUM ALCOHOL IS 5 mg/dL FOR MEDICAL PURPOSES ONLY   CBC with Differential     Status: Abnormal   Collection Time: 12/31/14  8:51 PM  Result Value Ref Range   WBC 12.5 (H) 3.8 - 10.6 K/uL   RBC 5.18 4.40 - 5.90 MIL/uL   Hemoglobin 16.5 13.0 - 18.0 g/dL   HCT 47.1 15.6 - 59.6 %   MCV 93.6 80.0 - 100.0 fL   MCH 31.9 26.0 - 34.0 pg   MCHC 34.1 32.0 - 36.0 g/dL   RDW 44.3 66.7 - 28.6 %   Platelets 420 150 - 440 K/uL   Neutrophils Relative % 67 %   Neutro Abs 8.5 (H) 1.4 - 6.5 K/uL   Lymphocytes Relative 22 %   Lymphs Abs 2.8 1.0 - 3.6 K/uL   Monocytes Relative 8 %   Monocytes Absolute 1.0 0.2 - 1.0 K/uL   Eosinophils Relative 2 %   Eosinophils Absolute 0.2 0 - 0.7 K/uL   Basophils Relative 1 %   Basophils Absolute 0.1 0 - 0.1 K/uL  Protime-INR     Status: None   Collection Time: 12/31/14  8:51 PM  Result Value Ref Range   Prothrombin Time 14.1 11.4 - 15.0 seconds    INR 1.07   Urinalysis complete, with microscopic     Status: Abnormal   Collection Time: 12/31/14  8:51 PM  Result Value Ref Range   Color, Urine YELLOW (A) YELLOW   APPearance CLEAR (A) CLEAR   Glucose, UA NEGATIVE NEGATIVE mg/dL   Bilirubin Urine NEGATIVE NEGATIVE   Ketones, ur TRACE (A) NEGATIVE mg/dL   Specific Gravity, Urine 1.023 1.005 - 1.030   Hgb urine dipstick NEGATIVE NEGATIVE   pH 5.0 5.0 - 8.0   Protein, ur 30 (A) NEGATIVE mg/dL   Nitrite NEGATIVE NEGATIVE   Leukocytes, UA NEGATIVE NEGATIVE   RBC / HPF 0-5 0 - 5 RBC/hpf   WBC, UA 0-5 0 - 5 WBC/hpf   Bacteria, UA NONE SEEN NONE SEEN   Squamous Epithelial / LPF NONE SEEN NONE SEEN   Mucous PRESENT   Urine Drug Screen, Qualitative     Status: Abnormal   Collection Time: 12/31/14  8:51 PM  Result Value Ref Range   Tricyclic, Ur Screen POSITIVE (A) NONE DETECTED   Amphetamines, Ur Screen NONE DETECTED NONE DETECTED   MDMA (Ecstasy)Ur Screen NONE DETECTED NONE DETECTED   Cocaine Metabolite,Ur Tok NONE DETECTED NONE DETECTED   Opiate, Ur Screen NONE DETECTED NONE DETECTED   Phencyclidine (PCP) Ur S NONE DETECTED NONE DETECTED   Cannabinoid 50 Ng, Ur [place] NONE DETECTED NONE DETECTED   Barbiturates, Ur Screen NONE DETECTED NONE DETECTED   Benzodiazepine, Ur Scrn POSITIVE (A) NONE DETECTED   Methadone Scn, Ur NONE DETECTED NONE DETECTED    Comment: (NOTE) 100  Tricyclics, urine               Cutoff 1000 ng/mL 200  Amphetamines, urine             Cutoff 1000 ng/mL 300  MDMA (Ecstasy), urine           Cutoff 500 ng/mL 400  Cocaine Metabolite, urine       Cutoff 300 ng/mL 500  Opiate, urine  Cutoff 300 ng/mL 600  Phencyclidine (PCP), urine      Cutoff 25 ng/mL 700  Cannabinoid, urine              Cutoff 50 ng/mL 800  Barbiturates, urine             Cutoff 200 ng/mL 900  Benzodiazepine, urine           Cutoff 200 ng/mL 1000 Methadone, urine                Cutoff 300 ng/mL 1100 1200 The urine drug screen  provides only a preliminary, unconfirmed 1300 analytical test result and should not be used for non-medical 1400 purposes. Clinical consideration and professional judgment should 1500 be applied to any positive drug screen result due to possible 1600 interfering substances. A more specific alternate chemical method 1700 must be used in order to obtain a confirmed analytical result.  1800 Gas chromato graphy / mass spectrometry (GC/MS) is the preferred 1900 confirmatory method.   Salicylate level     Status: None   Collection Time: 12/31/14  8:51 PM  Result Value Ref Range   Salicylate Lvl <0.0 2.8 - 30.0 mg/dL  Acetaminophen level     Status: Abnormal   Collection Time: 12/31/14  8:51 PM  Result Value Ref Range   Acetaminophen (Tylenol), Serum <10 (L) 10 - 30 ug/mL    Comment:        THERAPEUTIC CONCENTRATIONS VARY SIGNIFICANTLY. A RANGE OF 10-30 ug/mL MAY BE AN EFFECTIVE CONCENTRATION FOR MANY PATIENTS. HOWEVER, SOME ARE BEST TREATED AT CONCENTRATIONS OUTSIDE THIS RANGE. ACETAMINOPHEN CONCENTRATIONS >150 ug/mL AT 4 HOURS AFTER INGESTION AND >50 ug/mL AT 12 HOURS AFTER INGESTION ARE OFTEN ASSOCIATED WITH TOXIC REACTIONS.   Acetaminophen level     Status: Abnormal   Collection Time: 01/01/15  2:44 AM  Result Value Ref Range   Acetaminophen (Tylenol), Serum <10 (L) 10 - 30 ug/mL    Comment:        THERAPEUTIC CONCENTRATIONS VARY SIGNIFICANTLY. A RANGE OF 10-30 ug/mL MAY BE AN EFFECTIVE CONCENTRATION FOR MANY PATIENTS. HOWEVER, SOME ARE BEST TREATED AT CONCENTRATIONS OUTSIDE THIS RANGE. ACETAMINOPHEN CONCENTRATIONS >150 ug/mL AT 4 HOURS AFTER INGESTION AND >50 ug/mL AT 12 HOURS AFTER INGESTION ARE OFTEN ASSOCIATED WITH TOXIC REACTIONS.   Salicylate level     Status: None   Collection Time: 01/01/15  2:44 AM  Result Value Ref Range   Salicylate Lvl <8.6 2.8 - 30.0 mg/dL    Vitals: Blood pressure 152/118, pulse 72, temperature 97.6 F (36.4 C), temperature source  Oral, resp. rate 18, height $RemoveBe'5\' 9"'[id number]$  (1.753 m), weight 101.606 kg (224 lb), SpO2 99 %.  Risk to Self: Suicidal Ideation: Yes-Currently Present Suicidal Intent: No-Not Currently/Within Last 6 Months Is patient at risk for suicide?: Yes Suicidal Plan?: Yes-Currently Present Specify Current Suicidal Plan: Overdose Access to Means: Yes Specify Access to Suicidal Means: Overdose What has been your use of drugs/alcohol within the last 12 months?: None How many times?: 0 Other Self Harm Risks: None reported Triggers for Past Attempts: Family contact Intentional Self Injurious Behavior: None Risk to Others: Homicidal Ideation: No Thoughts of Harm to Others: No Current Homicidal Intent: No Current Homicidal Plan: No Access to Homicidal Means: No Identified Victim: None reported History of harm to others?: No Assessment of Violence: On admission Violent Behavior Description: None reported Does patient have access to weapons?: No Criminal Charges Pending?: No Does patient have a court date: No Prior Inpatient Therapy:  Prior Inpatient Therapy: No Prior Outpatient Therapy: Prior Outpatient Therapy:  (Psychiatric Services only) Does patient have an ACCT team?: No Does patient have Intensive In-House Services?  : No Does patient have [facility] services? : No Does patient have P4CC services?: No  No current facility-administered medications for this encounter.   Current Outpatient Prescriptions  Medication Sig Dispense Refill  . Acetaminophen-Codeine (TYLENOL/CODEINE #3) 300-30 MG per tablet Take 1 tablet by mouth every 6 (six) hours as needed for pain. 6 tablet 0  . busPIRone (BUSPAR) 15 MG tablet Take 15 mg by mouth daily.   3  . LORazepam (ATIVAN) 1 MG tablet Take 1 mg by mouth at bedtime.  3  . mupirocin ointment (BACTROBAN) 2 % Apply 1 application topically 2 (two) times daily. Right hand/finger lesions x 7-10 days 22 g 0  . sulfamethoxazole-trimethoprim (BACTRIM DS,SEPTRA DS) 800-160 MG per  tablet Take 1 tablet by mouth 2 (two) times daily. 14 tablet 0  . venlafaxine XR (EFFEXOR-XR) 150 MG 24 hr capsule Take 150 mg by mouth daily.    . [DISCONTINUED] mupirocin nasal ointment (BACTROBAN) 2 % Place 1 application into the nose 2 (two) times daily. Use one-half of tube in each nostril twice daily for five (5) days. After application, press sides of nose together and gently massage.      Musculoskeletal: Strength & Muscle Tone: within normal limits Gait & Station: normal Patient leans: N/A  Psychiatric Specialty Exam: Physical Exam  Nursing note and vitals reviewed.   ROS  Blood pressure 152/118, pulse 72, temperature 97.6 F (36.4 C), temperature source Oral, resp. rate 18, height $RemoveBe'5\' 9"'[id number]$  (1.753 m), weight 101.606 kg (224 lb), SpO2 99 %.Body mass index is 33.06 kg/(m^2).  General Appearance: Casual  Eye Contact::  Fair  Speech:  Clear and Coherent  Volume:  Normal  Mood:  Anxious, Depressed, Dysphoric, Hopeless, Irritable, Worthless and low and down  Affect:  Appropriate  Thought Process:  Circumstantial  Orientation:  Full (Time, Place, and Person)  Thought Content:  Negative  Suicidal Thoughts:  No  Homicidal Thoughts:  No  Memory:  Immediate;   Fair Recent;   Fair Remote;   Fair adequate  Judgement:  Fair  Insight:  Fair  Psychomotor Activity:  Normal  Concentration:  Fair  Recall:  [employer] [place]  Language: Fair  Akathisia:  No  Handed:  Right  AIMS (if indicated):     Assets:  Communication Skills Desire for Improvement Talents/Skills  ADL's:  Intact  Cognition: WNL  Sleep:      Medical Decision Making: Review of Psycho-Social Stressors (1)  Treatment Plan Summary: Plan start pt on anti-depressent meds and admit [place] after he is medically cleared and stable for help when bed is available  Plan:  Recommend psychiatric Inpatient admission when medically cleared. Disposition: as above  Lebert Lovern K 01/01/2015 6:42 PM

## 2015-01-01 NOTE — ED Notes (Signed)
ENVIRONMENTAL ASSESSMENT Potentially harmful objects out of patient reach: Yes.   Personal belongings secured: Yes.   Patient dressed in hospital provided attire only: Yes.   Plastic bags out of patient reach: Yes.   Patient care equipment (cords, cables, call bells, lines, and drains) shortened, removed, or accounted for: No. Equipment and supplies removed from bottom of stretcher: Yes.   Potentially toxic materials out of patient reach: Yes.   Sharps container removed or out of patient reach: Yes.   

## 2015-01-01 NOTE — ED Notes (Signed)
Patient assigned to appropriate care area. Patient oriented to unit/care area: Informed that, for their safety, care areas are designed for safety and monitored by security cameras at all times; and visiting hours explained to patient. Patient verbalizes understanding, and verbal contract for safety obtained. 

## 2015-01-01 NOTE — ED Notes (Signed)
Pt  states very anxious pacing in the halls note pt is very nauseous as well will notify MD.

## 2015-01-01 NOTE — ED Notes (Signed)
BEHAVIORAL HEALTH ROUNDING Patient sleeping: No. Patient alert and oriented: yes Behavior appropriate: Yes.  ; If no, describe:  Nutrition and fluids offered: Yes  Toileting and hygiene offered: Yes  Sitter present: not applicable Law enforcement present: Yes  

## 2015-01-01 NOTE — ED Notes (Signed)
BEHAVIORAL HEALTH ROUNDING Patient sleeping: No. Patient alert and oriented: yes Behavior appropriate: yes; If no, describe:  Nutrition and fluids offered: Yes  Toileting and hygiene offered: Yes  Sitter present: no Law enforcement present: Yes  

## 2015-01-01 NOTE — ED Provider Notes (Signed)
-----------------------------------------   6:46 AM on 01/01/2015 -----------------------------------------   Blood pressure 136/100, pulse 85, temperature 98.5 F (36.9 C), temperature source Oral, resp. rate 18, height  (1.753 m), weight 224 lb (101.606 kg), SpO2 99 %.  The patient had no acute events since last update.  Calm and cooperative at this time. Repeat Tylenol level remains negative. Disposition is pending per Psychiatry/Behavioral Medicine team recommendations.     Irean Hong, MD 01/01/15 [phone]

## 2015-01-01 NOTE — BHH Counselor (Signed)
Pt. has been given admission information to the [facility] by writer. Pt. Does not have any questions about the process.

## 2015-01-01 NOTE — ED Notes (Signed)
ED BHU PLACEMENT JUSTIFICATION Is the patient under IVC or is there intent for IVC: Yes.   Is the patient medically cleared: No. Is there vacancy in the ED BHU: Yes.   Is the population mix appropriate for patient: Yes.   Is the patient awaiting placement in inpatient or outpatient setting: Yes.   Has the patient had a psychiatric consult: Yes.  TTS Survey of unit performed for contraband, proper placement and condition of furniture, tampering with fixtures in bathroom, shower, and each patient room: Yes.  ; Findings:  APPEARANCE/BEHAVIOR calm and cooperative NEURO ASSESSMENT Orientation: time, place and person Hallucinations: No.None noted (Hallucinations) Speech: Normal Gait: normal RESPIRATORY ASSESSMENT Normal expansion.  Clear to auscultation.  No rales, rhonchi, or wheezing. CARDIOVASCULAR ASSESSMENT regular rate and rhythm, S1, S2 normal, no murmur, click, rub or gallop GASTROINTESTINAL ASSESSMENT soft, nontender, BS WNL, no r/g EXTREMITIES normal strength, tone, and muscle mass PLAN OF CARE Provide calm/safe environment. Vital signs assessed twice daily. ED BHU Assessment once each 12-hour shift. Collaborate with intake RN daily or as condition indicates. Assure the ED provider has rounded once each shift. Provide and encourage hygiene. Provide redirection as needed. Assess for escalating behavior; address immediately and inform ED provider.  Assess family dynamic and appropriateness for visitation as needed: No.; If necessary, describe findings:  Educate the patient/family about BHU procedures/visitation: Yes.  ; If necessary, describe findings:

## 2015-01-01 NOTE — BHH Counselor (Signed)
Per Dr. Kumar in the [facility] Conference Call, look for inpatient. Printed BHH Assessment and Facesheet.  

## 2015-01-01 NOTE — ED Notes (Signed)
BEHAVIORAL HEALTH ROUNDING Patient sleeping: No. Patient alert and oriented: yes Behavior appropriate: Yes.  ; If no, describe: anxious about going down stairs ready to go. Nutrition and fluids offered: Yes  Toileting and hygiene offered: Yes  Sitter present: yes Law enforcement present: Yes

## 2015-01-01 NOTE — ED Notes (Signed)
ED BHU PLACEMENT JUSTIFICATION Is the patient under IVC or is there intent for IVC: Yes.   Is the patient medically cleared: Yes.   Is there vacancy in the ED BHU: Yes.   Is the population mix appropriate for patient: Yes.   Is the patient awaiting placement in inpatient or outpatient setting: Yes.   Has the patient had a psychiatric consult: Yes.   Survey of unit performed for contraband, proper placement and condition of furniture, tampering with fixtures in bathroom, shower, and each patient room: Yes.  ; Findings:  APPEARANCE/BEHAVIOR Cooperative anxious NEURO ASSESSMENT Orientation: time person place Hallucinations: No.None noted (Hallucinations) Speech: Normal Gait: normal RESPIRATORY ASSESSMENT Normal expansion.  Clear to auscultation.  No rales, rhonchi, or wheezing. CARDIOVASCULAR ASSESSMENT regular rate and rhythm, S1, S2 normal, no murmur, click, rub or gallop GASTROINTESTINAL ASSESSMENT soft, nontender, BS WNL, no r/g EXTREMITIES normal strength, tone, and muscle mass PLAN OF CARE Provide calm/safe environment. Vital signs assessed twice daily. ED BHU Assessment once each 12-hour shift. Collaborate with intake RN daily or as condition indicates. Assure the ED provider has rounded once each shift. Provide and encourage hygiene. Provide redirection as needed. Assess for escalating behavior; address immediately and inform ED provider.  Assess family dynamic and appropriateness for visitation as needed: No.; If necessary, describe findings:  Educate the patient/family about BHU procedures/visitation: Yes.  ; If necessary, describe findings:

## 2015-01-01 NOTE — ED Notes (Signed)
Patient assigned to appropriate care area. Patient oriented to unit/care area: Informed that, for their safety, care areas are designed for safety and monitored by security cameras at all times; and visiting hours explained to patient. Patient verbalizes understanding, and verbal contract for safety obtained. Plan is to admit downstairs in behavioral uint.

## 2015-01-01 NOTE — ED Notes (Signed)
BEHAVIORAL HEALTH ROUNDING Patient sleeping: No. Patient alert and oriented: yes Behavior appropriate: No.; If no, describe: see note Nutrition and fluids offered: No Toileting and hygiene offered: Yes  Sitter present: no Law enforcement present: Yes

## 2015-01-01 NOTE — BH Specialist Note (Signed)
TTS Attempted to obtain pre-certification for services. Spoke with Raleesia E. At [employer]. [phone].  This insurance policy can only be certified between the hours of 7:00 am - 7:00 pm central standard time. [facility] staff will need to contact [employer] the following day to obtain the authorization.  The account will be noted that [facility] did attempt to gain the authorization on the date admitted.

## 2015-01-01 NOTE — ED Notes (Signed)
BEHAVIORAL HEALTH ROUNDING Patient sleeping: Yes.   Patient alert and oriented: not applicable Behavior appropriate: Yes.  ; If no, describe:  Nutrition and fluids offered: Yes  Toileting and hygiene offered: Yes  Sitter present: no Law enforcement present: Yes  

## 2015-01-01 NOTE — ED Notes (Signed)
Patient assigned to appropriate care area. Patient oriented to unit/care area: Informed that, for their safety, care areas are designed for safety and monitored by staff at all times; and visiting hours explained to patient. Patient verbalizes understanding, and verbal contract for safety obtained. 

## 2015-01-02 DIAGNOSIS — F1320 Sedative, hypnotic or anxiolytic dependence, uncomplicated: Secondary | ICD-10-CM

## 2015-01-02 DIAGNOSIS — E222 Syndrome of inappropriate secretion of antidiuretic hormone: Secondary | ICD-10-CM

## 2015-01-02 DIAGNOSIS — F1110 Opioid abuse, uncomplicated: Secondary | ICD-10-CM

## 2015-01-02 DIAGNOSIS — S46009A Unspecified injury of muscle(s) and tendon(s) of the rotator cuff of unspecified shoulder, initial encounter: Secondary | ICD-10-CM

## 2015-01-02 DIAGNOSIS — F13939 Sedative, hypnotic or anxiolytic use, unspecified with withdrawal, unspecified: Secondary | ICD-10-CM

## 2015-01-02 DIAGNOSIS — F13239 Sedative, hypnotic or anxiolytic dependence with withdrawal, unspecified: Secondary | ICD-10-CM

## 2015-01-02 DIAGNOSIS — E039 Hypothyroidism, unspecified: Secondary | ICD-10-CM

## 2015-01-02 DIAGNOSIS — F332 Major depressive disorder, recurrent severe without psychotic features: Secondary | ICD-10-CM

## 2015-01-02 DIAGNOSIS — F1390 Sedative, hypnotic, or anxiolytic use, unspecified, uncomplicated: Secondary | ICD-10-CM

## 2015-01-02 LAB — VITAMIN B12: VITAMIN B 12: 61 pg/mL — AB (ref 180–914)

## 2015-01-02 LAB — TSH: TSH: 1.631 u[IU]/mL (ref 0.350–4.500)

## 2015-01-02 MED ORDER — ATOMOXETINE HCL 40 MG PO CAPS
40.0000 mg | ORAL_CAPSULE | Freq: Every day | ORAL | Status: DC
  Administered 2015-01-03 – 2015-01-07 (×5): 40 mg via ORAL
  Filled 2015-01-02 (×7): qty 1

## 2015-01-02 MED ORDER — CHLORDIAZEPOXIDE HCL 25 MG PO CAPS
50.0000 mg | ORAL_CAPSULE | Freq: Three times a day (TID) | ORAL | Status: DC
  Administered 2015-01-02 – 2015-01-03 (×3): 50 mg via ORAL
  Filled 2015-01-02 (×4): qty 2

## 2015-01-02 MED ORDER — TRAZODONE HCL 50 MG PO TABS
ORAL_TABLET | ORAL | Status: AC
  Administered 2015-01-02: 100 mg via ORAL
  Filled 2015-01-02: qty 2

## 2015-01-02 MED ORDER — CHLORDIAZEPOXIDE HCL 5 MG PO CAPS
5.0000 mg | ORAL_CAPSULE | Freq: Three times a day (TID) | ORAL | Status: DC
  Administered 2015-01-02: 5 mg via ORAL

## 2015-01-02 MED ORDER — LORAZEPAM 2 MG PO TABS
2.0000 mg | ORAL_TABLET | Freq: Once | ORAL | Status: AC
  Administered 2015-01-02: 2 mg via ORAL

## 2015-01-02 MED ORDER — PROMETHAZINE HCL 25 MG PO TABS
25.0000 mg | ORAL_TABLET | Freq: Four times a day (QID) | ORAL | Status: DC | PRN
  Administered 2015-01-02 – 2015-01-06 (×5): 25 mg via ORAL
  Filled 2015-01-02 (×6): qty 1

## 2015-01-02 MED ORDER — TRAZODONE HCL 100 MG PO TABS
100.0000 mg | ORAL_TABLET | Freq: Once | ORAL | Status: AC
  Administered 2015-01-02: 100 mg via ORAL

## 2015-01-02 MED ORDER — LEVOTHYROXINE SODIUM 75 MCG PO TABS
150.0000 ug | ORAL_TABLET | Freq: Every day | ORAL | Status: DC
  Administered 2015-01-03 – 2015-01-07 (×5): 150 ug via ORAL
  Filled 2015-01-02 (×5): qty 2

## 2015-01-02 MED ORDER — TRAZODONE HCL 100 MG PO TABS
150.0000 mg | ORAL_TABLET | Freq: Every evening | ORAL | Status: DC | PRN
  Administered 2015-01-02: 150 mg via ORAL
  Filled 2015-01-02: qty 1

## 2015-01-02 MED ORDER — LORAZEPAM 2 MG PO TABS
2.0000 mg | ORAL_TABLET | Freq: Three times a day (TID) | ORAL | Status: DC | PRN
  Administered 2015-01-02: 2 mg via ORAL
  Filled 2015-01-02 (×2): qty 1

## 2015-01-02 MED ORDER — HYDRALAZINE HCL 25 MG PO TABS: 25.0000 mg | ORAL_TABLET | Freq: Three times a day (TID) | ORAL | Status: DC | PRN

## 2015-01-02 MED ORDER — HYDRALAZINE HCL 25 MG PO TABS
25.0000 mg | ORAL_TABLET | Freq: Once | ORAL | Status: AC
  Administered 2015-01-02: 25 mg via ORAL
  Filled 2015-01-02: qty 1

## 2015-01-02 MED ORDER — VENLAFAXINE HCL ER 75 MG PO CP24
75.0000 mg | ORAL_CAPSULE | Freq: Every day | ORAL | Status: DC
  Administered 2015-01-02 – 2015-01-07 (×6): 75 mg via ORAL
  Filled 2015-01-02 (×6): qty 1

## 2015-01-02 NOTE — BHH Group Notes (Signed)
[facility] LCSW Aftercare Discharge Planning Group Note   01/02/2015 3:24 PM  Participation Quality:  Patient attended group but only particiapted when asked questions by the facilitator. Patient shared his SMART goal is to "go home by getting on the right meds".   Mood/Affect:  Depressed  Thoughts of Suicide:  No Will you contract for safety?   NA  Current AVH:  No  Plan for Discharge/Comments:  Patient plans to discharge home from the hospital  Transportation Means: unk  Supports: Meade Maw, MSW, LCSWA

## 2015-01-02 NOTE — Progress Notes (Signed)
Recreation Therapy Notes  INPATIENT RECREATION THERAPY ASSESSMENT  Patient Details Name: Trevor Torres MRN: [id number] DOB: [birth year] Today's Date: 01/02/2015  Patient Stressors: Relationship (Marriage is over)  Coping Skills:   Isolate, Avoidance, Music, Sports  Personal Challenges: Anger, Communication, Concentration, Decision-Making, Expressing Yourself, Relationships, Self-Esteem/Confidence, Social Interaction, Stress Management, Trusting Others  Leisure Interests (2+):  Nature - Fishing, Individual - Other (Comment) (Hanging out with son)  Awareness of Community Resources:  Yes  Community Resources:  Other (Comment) [facility])  Current Use: Yes  If no, Barriers?:    Patient Strengths:  No  Patient Identified Areas of Improvement:  Anger management, decision making  Current Recreation Participation:  Nothing  Patient Goal for Hospitalization:  To go home  [place] of Residence:  [place] Residence:  [place]   Current SI (including self-harm):  No  Current HI:  No  Consent to Intern Participation: N/A   Jacquelynn Cree, LRT/CTRS 01/02/2015, 1:38 PM

## 2015-01-02 NOTE — Progress Notes (Signed)
Patient admitted for suicidal ideation.  Patient lost his job and his wife asked him to move out of the home.  Patient is tearful on admission, stating "I just want to go home and see my son."  At discharge patient plans to live with his mother.  Patient reports having issues with anxiety and anger.  Patient search performed.  No contraband found.
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
## 2015-01-02 NOTE — [facility] Group Notes (Signed)
[facility] LCSW Group Therapy  01/02/2015 3:23 PM  Type of Therapy:  Group Therapy  Participation Level:  Did Not Attend   Briza Bark T, MSW, LCSWA 01/02/2015, 3:23 PM  

## 2015-01-02 NOTE — Tx Team (Signed)
Initial Interdisciplinary Treatment Plan   PATIENT STRESSORS: Financial difficulties Marital or family conflict Occupational concerns   PATIENT STRENGTHS: Ability for insight Communication skills General fund of knowledge   PROBLEM LIST: Problem List/Patient Goals Date to be addressed Date deferred Reason deferred Estimated date of resolution  Depression 01/01/15     Suicidal Ideation 01/01/15     Anxiety 01/01/15                                          DISCHARGE CRITERIA:  Improved stabilization in mood, thinking, and/or behavior  PRELIMINARY DISCHARGE PLAN: Outpatient therapy  PATIENT/FAMIILY INVOLVEMENT: This treatment plan has been presented to and reviewed with the patient, Trevor Torres, and/or family member.  The patient and family have been given the opportunity to ask questions and make suggestions.  Trevor Torres [facility] 01/02/2015, 5:46 AM

## 2015-01-02 NOTE — Progress Notes (Signed)
Recreation Therapy Notes  Date: 08.17.16 Time: 3:00 pm Location: Craft Room  Group Topic: Self-esteem, Coping skills  Goal Area(s) Addresses:  Patient will identify positive attributes about self. Patient will identify at least one coping skill.  Behavioral Response: Did not attend  Intervention: All About Me  Activity: Patients were instructed to make a pamphlet including their life's motto, positive things about themselves, healthy coping skills, and their healthy support system   Education: LRT educated patients on ways they can increase their self-esteem.  Education Outcome: Patient did not attend group.  Clinical Observations/Feedback: Patient did not attend group.  Jacquelynn Cree, LRT/CTRS 01/02/2015 3:43 PM

## 2015-01-02 NOTE — Progress Notes (Signed)
Patient with depressed affect, anxious at times and able to verbalize needs to staff. No SI/HI at this time. Fair adls, fair appetite. Vital signs monitored and recorded and MD aware BP is elevated. MD into assess and new orders noted with meds administered as ordered. Patient with poor insight and poor coping skills. Encouraged to rest today and eat meals, drink fluids till meds in effect. Safety maintained.

## 2015-01-02 NOTE — [facility] Suicide Risk Assessment (Signed)
[facility] Admission Suicide Risk Assessment   Nursing information obtained from:  Patient Demographic factors:  Male, Caucasian, Unemployed Current Mental Status:  NA Loss Factors:  Decrease in vocational status, Loss of significant relationship, Legal issues, Financial problems / change in socioeconomic status Historical Factors:  NA Risk Reduction Factors:  Responsible for children under 42 years of age, Religious beliefs about death, Sense of responsibility to family Total Time spent with patient: 1 hour Principal Problem: Major depressive disorder, recurrent, severe without psychotic behavior Diagnosis:   Patient Active Problem List   Diagnosis Date Noted  . 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 Major depressive disorder, recurrent, severe without psychotic behavior [F33.2] 01/02/2015  . Hypothyroidism [E03.9] 01/02/2015  . Opioid use disorder, mild, abuse [F11.10] 01/02/2015  . Rotator cuff injury [S46.009A] 01/02/2015  . 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  World Science writer [facility]). Score between 0-7:  no or low risk or alcohol related problems. Score between 8-15:  moderate risk of alcohol related problems. Score between 16-19:  high risk of alcohol related problems. Score 20 or above:  warrants further diagnostic evaluation for alcohol dependence and treatment.   CLINICAL FACTORS:   Severe Anxiety and/or Agitation Depression:   Comorbid alcohol abuse/dependence Hopelessness Impulsivity Alcohol/Substance Abuse/Dependencies More than one psychiatric diagnosis Previous Psychiatric Diagnoses and Treatments    Psychiatric Specialty Exam: Physical Exam  ROS    COGNITIVE FEATURES  THAT CONTRIBUTE TO RISK:  Closed-mindedness    SUICIDE RISK:   Severe:  Frequent, intense, and enduring suicidal ideation, specific plan, no subjective intent, but some objective markers of intent (i.e., choice of lethal method), the method is accessible, some limited preparatory behavior, evidence of impaired self-control, severe dysphoria/symptomatology, multiple risk factors present, and few if any protective factors, particularly a lack of social support.  PLAN OF CARE: admit to [facility]  Medical Decision Making:  Established Problem, Worsening (2)  I certify that inpatient services furnished can reasonably be expected to improve the patient's condition.   Trevor Torres 01/02/2015, 2:40 PM

## 2015-01-02 NOTE — [facility] Group Notes (Signed)
[facility] Group Notes:  (Nursing/MHT/Case Management/Adjunct)  Date:  01/02/2015  Time:  4:26 PM  Type of Therapy:  Psychoeducational Skills  Participation Level:  None  Summary of Progress/Problems: Pt left group very tearful after introductions and review of group rules. Pt's nurse was informed.   Lynelle Smoke [facility] 01/02/2015, 4:26 PM

## 2015-01-03 ENCOUNTER — Inpatient Hospital Stay: Payer: 59

## 2015-01-03 DIAGNOSIS — E538 Deficiency of other specified B group vitamins: Secondary | ICD-10-CM

## 2015-01-03 LAB — WOUND CULTURE

## 2015-01-03 MED ORDER — HYDROXYZINE HCL 50 MG PO TABS
50.0000 mg | ORAL_TABLET | Freq: Every evening | ORAL | Status: DC | PRN
  Administered 2015-01-03 – 2015-01-05 (×3): 50 mg via ORAL
  Filled 2015-01-03 (×4): qty 1

## 2015-01-03 MED ORDER — CHLORDIAZEPOXIDE HCL 25 MG PO CAPS
25.0000 mg | ORAL_CAPSULE | Freq: Three times a day (TID) | ORAL | Status: DC
  Administered 2015-01-03 – 2015-01-07 (×12): 25 mg via ORAL
  Filled 2015-01-03 (×12): qty 1

## 2015-01-03 MED ORDER — CYANOCOBALAMIN 1000 MCG/ML IJ SOLN
1000.0000 ug | Freq: Every day | INTRAMUSCULAR | Status: DC
  Administered 2015-01-03 – 2015-01-07 (×5): 1000 ug via SUBCUTANEOUS
  Filled 2015-01-03 (×5): qty 1

## 2015-01-03 MED ORDER — GADOBENATE DIMEGLUMINE 529 MG/ML IV SOLN
20.0000 mL | Freq: Once | INTRAVENOUS | Status: AC | PRN
  Administered 2015-01-03: 20 mL via INTRAVENOUS

## 2015-01-03 MED ORDER — SERTRALINE HCL 50 MG PO TABS
25.0000 mg | ORAL_TABLET | Freq: Every day | ORAL | Status: DC
  Administered 2015-01-03: 50 mg via ORAL
  Administered 2015-01-04: 25 mg via ORAL
  Filled 2015-01-03 (×2): qty 2

## 2015-01-03 NOTE — Progress Notes (Signed)
Patient slept for 7 hours, uneventful night 

## 2015-01-03 NOTE — Progress Notes (Signed)
Assessment completed, pt is withdrawn to his room sleeping, mood is depressed with sad affect. Pt stated that his day wasn't so great, but that the doctors are working on getting his medication right. Pt denies any SI/HI/VAH, he attended PM group and complied with HS meds. Pt c/o nausea, said he threw up his diner, MD notified, received order for phenergan. Pt also medicated with Ativan  for anxiety and Trazodone  for sleep. CIWA of 2 documented at bedtime. No further concern voiced, safety maintained.

## 2015-01-03 NOTE — [facility] Group Notes (Signed)
[facility] Group Notes:  (Nursing/MHT/Case Management/Adjunct)  Date:  01/03/2015  Time:  9:48 AM  Type of Therapy:  Goal Setting  Participation Level:  Did Not Attend   Summary of Progress/Problems:  Trevor Torres 01/03/2015, 9:48 AM

## 2015-01-03 NOTE — Progress Notes (Signed)
Recreation Therapy Notes  Date: 08.18.16 Time: 3:00 pm Location: Craft Room  Group Topic: Leisure Education  Goal Area(s) Addresses:  Patient will identify activities for each letter of the alphabet. Patient will verbalize ability to integrate positive leisure into life post d/c. Patient will verbalize ability to use leisure as a coping mechanism.  Behavioral Response: Did not attend  Intervention: Leisure Alphabet  Activity: Patients were given a leisure alphabet worksheet and instructed to list healthy leisure activities for each letter of the alphabet.  Education: LRT educated patient on what is needed to participate in leisure  Education Outcome: Patient did not attend group.  Clinical Observations/Feedback: Patient did not attend group.  Jurni Cesaro M, LRT/CTRS 01/03/2015 3:59 PM 

## 2015-01-03 NOTE — [facility] Group Notes (Signed)
[facility] Group Notes:  (Nursing/MHT/Case Management/Adjunct)  Date:  01/03/2015  Time:  1:58 PM  Type of Therapy:  Psychoeducational Skills  Participation Level:  Minimal  Participation Quality:  Attentive  Affect:  Anxious and Depressed  Cognitive:  Appropriate  Insight:  Improving  Engagement in Group:  Limited  Modes of Intervention:  Activity and Socialization  Summary of Progress/Problems:  Trevor Torres 01/03/2015, 1:58 PM

## 2015-01-03 NOTE — Progress Notes (Signed)
Vernie Shanks, RN Registered Nurse Signed Nursing Progress Notes 01/03/2015 6:23 PM    Expand All Collapse All   Patient alert x 4. Affect flat. Endorses depression. He has remained in room most of the day except for meals and meds. Will cont to monitor for safety.

## 2015-01-03 NOTE — Plan of Care (Signed)
Problem: [facility] Participation in Recreation Therapeutic Interventions Goal: STG-Patient will demonstrate improved self esteem by identif STG: Self-Esteem - Within 4 treatment sessions, patient will verbalize at least 5 positive affirmation statements in each of 2 treatment sessions to increase self-esteem post d/c.  Outcome: Progressing Treatment Session 1; Completed 1 out of 2: At approximately 2:30 pm, LRT met with patient in patient room. Patient verbalized 5 positive affirmation statements. Patient reported he felt "nothing right now". LRT encouraged patient to continue saying positive affirmation statements.  Leonette Monarch, LRT/CTRS 08.18.16 2:43 pm Goal: STG-Patient will identify at least five coping skills for ** STG: Coping Skills - Within 4 treatment sessions, patient will verbalize at least 5 coping skills for anger in each of 2 treatment sessions to increase anger management skills post d/c.  Outcome: Progressing Treatment Session 1; Completed 1 out of 2: At approximately 2:30 pm, LRT met with patient in patient room. Patient verbalized 5 coping skills for anger. Patient verbalized what triggers him to get angry and how his body responds to anger. LRT provided suggestions on how patient can remember to use his healthy coping skills when he is angry.  Leonette Monarch, LRT/CTRS 08.18.16 2:46 pm Goal: STG-Other Recreation Therapy Goal (Specify) STG: Stress Management - Within 5 treatment sessions, patient will demonstrate at least one stress management technique in one treatment session to increase stress management skills post d/c.  Outcome: Progressing Treatment Session 1; Completed 0 out of 1: At approximately 2:30 pm, LRT met with patient in patient room. LRT educated and provided patient with handouts on stress management techniques. Patient verbalized understanding. LRT encouraged patient to read over and practice the stress management techniques.  Leonette Monarch,  LRT/CTRS 08.18.16 2:47 pm  Problem: [facility] Participation in Recreation Therapeutic Interventions Goal: STG-Other Recreation Therapy Goal (Specify) STG: Decision Making - Within 4 treatment sessions, patient will verbalize understanding of a decision making chart in each of 2 treatment sessions to increase decision making skills post d/c.  Outcome: Progressing Treatment Session 1; Completed 1 out of 2: At approximately 2:30 pm, LRT met with patient in patient room. LRT educated patient on decision making chart. Patient verbalized understanding. LRT provided patient with blank decision making charts and encouraged patient to use the charts when he needed to make a decision.  Leonette Monarch, LRT/CTRS 08.18.16 2:49 pm

## 2015-01-03 NOTE — [facility] Group Notes (Signed)
[facility] LCSW Group Therapy  01/03/2015 3:19 PM  Type of Therapy:  Group Therapy  Participation Level:  Did Not Attend  Modes of Intervention:  Discussion, Education, Socialization and Support  Summary of Progress/Problems: Balance in life: Patients will discuss the concept of balance and how it looks and feels to be unbalanced. Pt will identify areas in their life that is unbalanced and ways to become more balanced.    Trevor Torres L Zamier Eggebrecht MSW, LCSWA  01/03/2015, 3:19 PM 
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
## 2015-01-03 NOTE — Tx Team (Signed)
Interdisciplinary Treatment Plan Update (Adult)  Date:  01/03/2015 Time Reviewed:  2:41 PM  Progress in Treatment: Attending groups: No. Participating in groups:  No. Taking medication as prescribed:  Yes. Tolerating medication:  Yes. Family/Significant othe contact made:  No, will contact:  if patient provides consent Patient understands diagnosis:  Yes. Discussing patient identified problems/goals with staff:  Yes. Medical problems stabilized or resolved:  Yes. Denies suicidal/homicidal ideation: Yes. Patient denies SI/HI Issues/concerns per patient self-inventory:  No. Other:  New problem(s) identified: No, Describe:  none reported  Discharge Plan or Barriers: Patient plans to discharge home with his mother. Patient is experiencing depression however has a vitamin B deficiency. Patient has been followed by [facility] Dr. Percell Belt but is seeking a different follow up and may need a referral to [facility].   Reason for Continuation of Hospitalization: Depression Suicidal ideation  Comments:  Estimated length of stay: up to 4 days, expected discharge Monday 01/07/15  New goal(s):  Review of initial/current patient goals per problem list:   See Care Plan  Attendees: Physician:  Radene Journey, MD 8/18/20162:41 PM  Nursing:   Shelia Media, RN 8/18/20162:41 PM  Other:  Beryl Meager, LCSWA 8/18/20162:41 PM   Scribe for Treatment Team:   Lulu Riding, MSW, LCSWA  01/03/2015, 2:41 PM

## 2015-01-03 NOTE — Plan of Care (Signed)
Problem: Ineffective individual coping Goal: STG: Pt will be able to identify effective and ineffective STG: Pt will be able to identify effective and ineffective coping patterns Outcome: Progressing Pt is working towards identifying effective and ineffective coping skills.     

## 2015-01-04 MED ORDER — HYDROXYZINE HCL 25 MG PO TABS
25.0000 mg | ORAL_TABLET | Freq: Three times a day (TID) | ORAL | Status: DC | PRN
  Administered 2015-01-04 – 2015-01-07 (×4): 25 mg via ORAL
  Filled 2015-01-04 (×3): qty 1
  Filled 2015-01-04: qty 13

## 2015-01-04 MED ORDER — SERTRALINE HCL 50 MG PO TABS
25.0000 mg | ORAL_TABLET | Freq: Every day | ORAL | Status: DC
  Administered 2015-01-05: 50 mg via ORAL
  Filled 2015-01-04: qty 1

## 2015-01-04 NOTE — [facility] Group Notes (Signed)
[facility] LCSW Aftercare Discharge Planning Group Note   01/04/2015 11:24 AM  Participation Quality:  Did not attend.   Trevor Torres L Takerra Lupinacci MSW, LCSWA  

## 2015-01-04 NOTE — Progress Notes (Signed)
Recreation Therapy Notes  Date: 08.19.16 Time: 3:00 pm Location: Craft Room  Group Topic: Problem solving, communication, teamwork  Goal Area(s) Addresses:  Patient will work in teams towards shared goal. Patient will verbalize skills needed to make activity successful. Patient will verbalize benefit of using skills identified to reach post d/c goals.  Behavioral Response: Did not attend   Intervention: Landing Pad  Activity: Patients were divided into two teams and given 15 straws and approximately 2.5 feet of tape. Patients  instructed to build a landing pad to catch a golf ball that would be dropped from approximately 4 feet.  Education: LRT educated patients on why communication, problem solving, and teamwork is important.  Education Outcome: Patient did not attend group.  Clinical Observations/Feedback: Patient did not attend group.  Jacquelynn Cree, LRT/CTRS 01/04/2015 4:56 PM
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
## 2015-01-04 NOTE — [facility] Group Notes (Signed)
[facility] Group Notes:  (Nursing/MHT/Case Management/Adjunct)  Date:  01/04/2015  Time:  1:51 PM  Type of Therapy:  Group Therapy  Participation Level:  Minimal  Participation Quality:  Appropriate  Affect:  Appropriate  Cognitive:  Appropriate  Insight:  Good  Engagement in Group:  Engaged  Modes of Intervention:  Support  Summary of Progress/Problems:  Trevor Torres 01/04/2015, 1:51 PM

## 2015-01-04 NOTE — Progress Notes (Signed)
D: Pt is awake and active in the milieu today. Pt mood is depressed and his affect is sad. Pt forwards little to staff and is somewhat isolative, although he denies SI/Hi and AVH.   A: Writer provided emotional support and administered medications as prescribed.   R: Pt is spending time reading in room. Has  very little interaction with staff and peers. Pt behavior is appropriate on the unit,

## 2015-01-04 NOTE — Progress Notes (Signed)
D: Pt is awake in his room this evening. Pt mood is depressed and his affect is blunted. Pt is somewhat irritable and forwards little to staff, and avoids eye contact.  A: Writer provided emotional support and administered medications as prescribed.   R: Pt behavior is appropriate on the unit, although he is isolative and withdrawn.  Pt went to bed following evening snack.

## 2015-01-04 NOTE — Plan of Care (Signed)
Problem: [facility] Participation in Recreation Therapeutic Interventions Goal: STG-Patient will demonstrate improved self esteem by identif STG: Self-Esteem - Within 4 treatment sessions, patient will verbalize at least 5 positive affirmation statements in each of 2 treatment sessions to increase self-esteem post d/c.  Outcome: Completed/Met Date Met:  01/04/15 Treatment Session 2; Completed 2 out of 2: At approximately 4:20 pm, LRT met with patient in patient room. Patient verbalized 5 positive affirmation statements. LRT encouraged patient to continue saying positive affirmation statements.  Leonette Monarch, LRT/CTRS 08.19.16 5:15 pm Goal: STG-Patient will identify at least five coping skills for ** STG: Coping Skills - Within 4 treatment sessions, patient will verbalize at least 5 coping skills for anger in each of 2 treatment sessions to increase anger management skills post d/c.  Outcome: Completed/Met Date Met:  01/04/15 Treatment Session 2; Completed 2 out of 2: At approximately 4:20 pm, LRT met with patient in patient room. Patient verbalized 5 coping skills for anger.   Leonette Monarch, LRT/CTRS 08.19.16 5:16 pm Goal: STG-Other Recreation Therapy Goal (Specify) STG: Stress Management - Within 5 treatment sessions, patient will demonstrate at least one stress management technique in one treatment session to increase stress management skills post d/c.  Outcome: Not Progressing Treatment Session 2; Completed 0 out of 1: At approximately 4:20 pm, LRT met with patient in patient room. Patient reported he had not read over the stress management techniques. LRT encouraged patient to read over and practice the stress management techniques.  Leonette Monarch, LRT/CTRS 08.19.16 5:17 pm  Problem: [facility] Participation in Recreation Therapeutic Interventions Goal: STG-Other Recreation Therapy Goal (Specify) STG: Decision Making - Within 4 treatment sessions, patient will verbalize understanding  of a decision making chart in each of 2 treatment sessions to increase decision making skills post d/c.  Outcome: Completed/Met Date Met:  01/04/15 Treatment Session 2; Completed 2 out of 2: At approximately 4:20 pm, LRT met with patient in patient room. LRT educated patient on decision making chart. Patient verbalized understanding. LRT provided patient with decision making chart.  Leonette Monarch, LRT/CTRS 08.19.16 5:18 pm

## 2015-01-05 NOTE — Plan of Care (Signed)
Problem: Ineffective individual coping Goal: LTG: Patient will report a decrease in negative feelings Outcome: Not Progressing Patient declines full participation in the assessment process. States he is nauseated and anxious.  Goal: STG: Patient will remain free from self harm Outcome: Progressing No self harm.   Problem: Alteration in mood Goal: LTG-Pt's behavior demonstrates decreased signs of depression (Patient's behavior demonstrates decreased signs of depression to the point the patient is safe to return home and continue treatment in an outpatient setting)  Outcome: Not Progressing Not progressing.  Goal: STG-Patient is able to discuss feelings and issues (Patient is able to discuss feelings and issues leading to depression)  Outcome: Not Progressing Declines discussion of feelings and issues.  Goal: STG-Patient reports thoughts of self-harm to staff Outcome: Progressing Contracts for safety.

## 2015-01-05 NOTE — Progress Notes (Signed)
Darnelle Spangle, RN Registered Nurse Signed Nursing Progress Notes 01/04/2015 6:00 PM    Expand All Collapse All   D: Pt is awake and alert. Pt mood is depressed and his affect is sad. Pt forwards little to staff and remains isolative, although he denies SI/Hi and AVH.  Refused to go to the NA meeting.   A: Writer provided emotional support and administered medications as prescribed.   R: Pt is spending time reading in room. Has very little interaction with staff and peers. Pt behavior is appropriate on the unit,

## 2015-01-05 NOTE — [facility] Group Notes (Signed)
[facility] LCSW Group Therapy  01/05/2015 3:57 PM  Type of Therapy:  Group Therapy  Participation Level:  Did Not Attend  Modes of Intervention:  Discussion, Education, Socialization and Support  Summary of Progress/Problems:Pt will identify unhealthy thoughts and how they impact their emotions and behavior. Pt will be encouraged to discuss these thoughts, emotions and behaviors with the group.   Belkis Norbeck L Lismary Kiehn MSW, LCSWA  01/05/2015, 3:57 PM 
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
## 2015-01-06 MED ORDER — SERTRALINE HCL 50 MG PO TABS
50.0000 mg | ORAL_TABLET | Freq: Every day | ORAL | Status: DC
  Administered 2015-01-06: 50 mg via ORAL
  Filled 2015-01-06: qty 1
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
## 2015-01-06 NOTE — [facility] Group Notes (Signed)
[facility] LCSW Group Therapy  01/06/2015 2:20 PM  Type of Therapy:  Group Therapy  Participation Level:  Did Not Attend  Modes of Intervention:  Discussion, Education, Socialization and Support  Summary of Progress/Problems: Todays topic: Grudges  Patients will be encouraged to discuss their thoughts, feelings, and behaviors as to why one holds on to grudges and reasons why people have grudges. Patients will process the impact of grudges on their daily lives and identify thoughts and feelings related to holding grudges. Patients will identify feelings and thoughts related to what life would look like without grudges.   Trevor Torres L Presly Steinruck MSW, LCSWA  01/06/2015, 2:20 PM  

## 2015-01-06 NOTE — [facility] Group Notes (Signed)
[facility] Group Notes:  (Nursing/MHT/Case Management/Adjunct)  Date:  01/06/2015  Time:  12:44 AM  Type of Therapy:  Group Therapy/Outside Activity  Participation Level:  Did Not Attend  Participation Quality:  N/A  Affect:  N/A  Cognitive:  N/A  Insight:  None  Engagement in Group:  None  Modes of Intervention:  Activity  Summary of Progress/Problems:  Trevor Torres 01/06/2015, 12:44 AM

## 2015-01-06 NOTE — Progress Notes (Signed)
Patient is pleasant, cooperative, and med compliant. He continues to endorse anxiety but is passively interactive in the milieu. He notes no needs or distress and is reluctant to discuss his situation / feelings. Denies current SI, HI, and AVH.            

## 2015-01-06 NOTE — Progress Notes (Signed)
Pt was extremely upset. He came to the nurses station crying.  Medications given as per orders.  Will cont to monitor for safety.

## 2015-01-06 NOTE — Progress Notes (Signed)
Patient is pleasant, cooperative, and med compliant. He continues to endorse anxiety but is passively interactive in the milieu. He notes no needs or distress and is reluctant to discuss his situation / feelings. Denies current SI, HI, and AVH.

## 2015-01-07 ENCOUNTER — Encounter: Payer: Self-pay | Admitting: Psychiatry

## 2015-01-07 DIAGNOSIS — I10 Essential (primary) hypertension: Secondary | ICD-10-CM

## 2015-01-07 HISTORY — DX: Essential (primary) hypertension: I10

## 2015-01-07 MED ORDER — AMLODIPINE BESYLATE 10 MG PO TABS: 10.0000 mg | ORAL_TABLET | Freq: Every day | ORAL | Status: DC

## 2015-01-07 MED ORDER — VENLAFAXINE HCL ER 37.5 MG PO CP24
37.5000 mg | ORAL_CAPSULE | Freq: Every day | ORAL | Status: DC
Start: 2015-01-07 — End: 2018-01-10

## 2015-01-07 MED ORDER — SERTRALINE HCL 50 MG PO TABS: 50.0000 mg | ORAL_TABLET | Freq: Every day | ORAL | Status: DC

## 2015-01-07 MED ORDER — ATOMOXETINE HCL 40 MG PO CAPS: 40.0000 mg | ORAL_CAPSULE | Freq: Every day | ORAL | Status: DC

## 2015-01-07 MED ORDER — AMLODIPINE BESYLATE 10 MG PO TABS
10.0000 mg | ORAL_TABLET | Freq: Every day | ORAL | Status: DC
  Administered 2015-01-07: 10 mg via ORAL
  Filled 2015-01-07: qty 1

## 2015-01-07 MED ORDER — LEVOTHYROXINE SODIUM 150 MCG PO TABS: 150.0000 ug | ORAL_TABLET | Freq: Every day | ORAL | Status: DC

## 2015-01-07 MED ORDER — HYDROXYZINE HCL 25 MG PO TABS: 25.0000 mg | ORAL_TABLET | Freq: Three times a day (TID) | ORAL | Status: DC | PRN

## 2015-01-07 MED ORDER — CYANOCOBALAMIN 1000 MCG/ML IJ SOLN: 1000.0000 ug | Freq: Every day | INTRAMUSCULAR | Status: DC

## 2015-01-07 NOTE — Plan of Care (Signed)
Problem: [facility] Participation in Recreation Therapeutic Interventions Goal: STG-Other Recreation Therapy Goal (Specify) STG: Stress Management - Within 5 treatment sessions, patient will demonstrate at least one stress management technique in one treatment session to increase stress management skills post d/c.  Outcome: Completed/Met Date Met:  01/07/15 Treatment Session 3; Completed 1 out of 1: At approximately 11:40 am, LRT met with patient in patient room. Patient reported he read over and practiced the stress management techniques. Patient demonstrated one stress management technique. LRT encouraged patient to continue practicing the stress management techniques.  Leonette Monarch, LRT/CTRS 08.22.16 1:52 pm

## 2015-01-07 NOTE — [facility] Suicide Risk Assessment (Signed)
[facility] Discharge Suicide Risk Assessment   Demographic Factors:  Male, Caucasian, Low socioeconomic status and Unemployed  Total Time spent with patient: 30 minutes   Psychiatric Specialty Exam: Physical Exam  ROS                                                         Have you used any form of tobacco in the last 30 days? (Cigarettes, Smokeless Tobacco, Cigars, and/or Pipes): No  Has this patient used any form of tobacco in the last 30 days? (Cigarettes, Smokeless Tobacco, Cigars, and/or Pipes) No  Mental Status Per Nursing Assessment::   On Admission:  NA  Current Mental Status by Physician: Denies SI, HI, auditory or visual hallucinations. Mood is improved. Affect brighter. Patient is hopeful and future oriented.  Loss Factors: Loss of significant relationship and Financial problems/change in socioeconomic status  Historical Factors: Prior suicide attempts  Risk Reduction Factors:   Responsible for children under 79 years of age, Sense of responsibility to family and Positive social support  Continued Clinical Symptoms:  Depression:   Comorbid alcohol abuse/dependence Impulsivity Alcohol/Substance Abuse/Dependencies  Cognitive Features That Contribute To Risk:  Closed-mindedness    Suicide Risk:  Minimal: No identifiable suicidal ideation.  Patients presenting with no risk factors but with morbid ruminations; may be classified as minimal risk based on the severity of the depressive symptoms  Principal Problem: Major depressive disorder, recurrent, severe without psychotic behavior Discharge Diagnoses:  Patient Active Problem List   Diagnosis Date Noted  . HTN (hypertension) [I10] 01/07/2015  . Vitamin B12 deficiency [E53.8] 01/03/2015  . 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 Major depressive disorder, recurrent, severe without psychotic behavior  [F33.2] 01/02/2015  . Hypothyroidism [E03.9] 01/02/2015  . Opioid use disorder, mild, abuse [F11.10] 01/02/2015  . Rotator cuff injury [S46.009A] 01/02/2015  . 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 Go on 01/08/2015.   Why:  For follow-up care appointement on Tuesday 01/08/15 at 9:00am; walkin appointements M-F 9am-4pm   Contact information:   [address] [place], [place] Ph [phone] Fax [phone]       Is patient on multiple antipsychotic therapies at discharge:  No   Has Patient had three or more failed trials of antipsychotic monotherapy by history:  No  Recommended Plan for Multiple Antipsychotic Therapies: NA    Trevor Torres 01/07/2015, 9:21 AM

## 2015-01-07 NOTE — Progress Notes (Signed)
  [facility] Adult Case Management Discharge Plan :  Will you be returning to the same living situation after discharge:  Yes,  home with mom At discharge, do you have transportation home?: Yes,  mother will pick patient up at discharge Do you have the ability to pay for your medications: Yes,  patient has insurance  Release of information consent forms completed and in the chart;  Patient's signature needed at discharge.  Patient to Follow up at: Follow-up Information    Follow up with [facility]. Go on 01/08/2015.   Why:  For follow-up care appointement on Tuesday 01/08/15 at 9:00am; walkin appointements M-F 9am-4pm   Contact information:   [address] [place], [place] Ph [phone] Fax [phone]      Follow up with [facility], Arlyss Repress, NP. Go on 01/14/2015.   Specialty:  Family Medicine   Why:  9:40 am f/u for High blood pressure and B12 deficiency   Contact information:   [address] [place] [address] [phone]       Patient denies SI/HI: Yes,  patient denies SI/HI    Safety Planning and Suicide Prevention discussed: Yes,  SPE discussed with patient but patient refused consent for family.  Have you used any form of tobacco in the last 30 days? (Cigarettes, Smokeless Tobacco, Cigars, and/or Pipes): No  Has patient been referred to the Quitline?: N/A patient is not a smoker  Lulu Riding, MSW, LCSWA 01/07/2015, 11:55 AM

## 2015-01-07 NOTE — Progress Notes (Signed)
AVS H&P Discharge Summary faxed to [facility] for hospital follow-up °

## 2015-01-07 NOTE — Progress Notes (Signed)
Recreation Therapy Notes  INPATIENT RECREATION TR PLAN  Patient Details Name: Trevor Torres MRN: [id number] DOB: 1971/01/08 Today's Date: 01/07/2015  Rec Therapy Plan Is patient appropriate for Therapeutic Recreation?: Yes Treatment times per week: At least 2 times a week TR Treatment/Interventions: 1:1 session, Group participation (Comment) (Appropriate participation in daily recreation therapy tx)  Discharge Criteria Pt will be discharged from therapy if:: Discharged Treatment plan/goals/alternatives discussed and agreed upon by:: Patient/family  Discharge Summary Short term goals set: See Care Plan Short term goals met: Complete Progress toward goals comments: One-to-one attended One-to-one attended: Self-esteem, stress management, anger management, decision making Reason goals not met: N/A Therapeutic equipment acquired: None Reason patient discharged from therapy: Discharge from hospital Pt/family agrees with progress & goals achieved: Yes Date patient discharged from therapy: 01/07/15   Leonette Monarch, LRT/CTRS 01/07/2015, 1:53 PM

## 2015-01-07 NOTE — Discharge Instructions (Signed)
Trevor Torres primary care Address: [address] [place], [place] [address]  Phone: [phone] Follow up: Monday 8/29 at 9:40 am

## 2015-01-07 NOTE — Progress Notes (Signed)
Patient discharged home ambulatory with mother. Discharge instructions reviewed. Patient verbalized understanding of meds and follow up care. Denies SI/HI/AVH. Belongings returned.

## 2015-01-07 NOTE — [facility] Suicide Risk Assessment (Signed)
[facility] INPATIENT:  Family/Significant Other Suicide Prevention Education  Suicide Prevention Education:  Patient Refusal for Family/Significant Other Suicide Prevention Education: The patient Trevor Torres has refused to provide written consent for family/significant other to be provided Family/Significant Other Suicide Prevention Education during admission and/or prior to discharge.  Physician notified.  Lulu Riding, MSW, LCSWA 01/07/2015, 11:54 AM
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
## 2015-01-17 DIAGNOSIS — L0390 Cellulitis, unspecified: Secondary | ICD-10-CM

## 2015-01-17 HISTORY — DX: Cellulitis, unspecified: L03.90

## 2015-01-22 ENCOUNTER — Encounter: Payer: Self-pay | Admitting: *Deleted

## 2015-01-22 ENCOUNTER — Emergency Department
Admission: EM | Admit: 2015-01-22 | Discharge: 2015-01-22 | Disposition: A | Discharge status: Home / Self Care | Payer: 59 | Attending: Emergency Medicine | Admitting: Emergency Medicine

## 2015-01-22 DIAGNOSIS — Z79899 Other long term (current) drug therapy: Secondary | ICD-10-CM | POA: Insufficient documentation

## 2015-01-22 DIAGNOSIS — I10 Essential (primary) hypertension: Secondary | ICD-10-CM | POA: Insufficient documentation

## 2015-01-22 DIAGNOSIS — L0291 Cutaneous abscess, unspecified: Secondary | ICD-10-CM

## 2015-01-22 DIAGNOSIS — L02414 Cutaneous abscess of left upper limb: Secondary | ICD-10-CM | POA: Diagnosis not present

## 2015-01-22 DIAGNOSIS — L03114 Cellulitis of left upper limb: Secondary | ICD-10-CM | POA: Insufficient documentation

## 2015-01-22 LAB — BASIC METABOLIC PANEL
ANION GAP: 7 (ref 5–15)
BUN: 7 mg/dL (ref 6–20)
CHLORIDE: 102 mmol/L (ref 101–111)
CO2: 26 mmol/L (ref 22–32)
Calcium: 8.6 mg/dL — ABNORMAL LOW (ref 8.9–10.3)
Creatinine, Ser: 1.04 mg/dL (ref 0.61–1.24)
Glucose, Bld: 90 mg/dL (ref 65–99)
POTASSIUM: 3.8 mmol/L (ref 3.5–5.1)
SODIUM: 135 mmol/L (ref 135–145)

## 2015-01-22 LAB — CBC
HCT: 37 % — ABNORMAL LOW (ref 40.0–52.0)
HEMOGLOBIN: 12.9 g/dL — AB (ref 13.0–18.0)
MCH: 31.7 pg (ref 26.0–34.0)
MCHC: 35 g/dL (ref 32.0–36.0)
MCV: 90.6 fL (ref 80.0–100.0)
PLATELETS: 432 10*3/uL (ref 150–440)
RBC: 4.09 MIL/uL — AB (ref 4.40–5.90)
RDW: 13.8 % (ref 11.5–14.5)
WBC: 10.4 10*3/uL (ref 3.8–10.6)

## 2015-01-22 MED ORDER — PROMETHAZINE HCL 12.5 MG PO TABS: 12.5000 mg | ORAL_TABLET | Freq: Four times a day (QID) | ORAL | Status: DC | PRN

## 2015-01-22 MED ORDER — CLINDAMYCIN HCL 300 MG PO CAPS: 300.0000 mg | ORAL_CAPSULE | Freq: Three times a day (TID) | ORAL | Status: DC

## 2015-01-22 MED ORDER — ONDANSETRON HCL 4 MG/2ML IJ SOLN
4.0000 mg | Freq: Once | INTRAMUSCULAR | Status: AC
  Administered 2015-01-22: 4 mg via INTRAVENOUS

## 2015-01-22 MED ORDER — MORPHINE SULFATE (PF) 4 MG/ML IV SOLN
INTRAVENOUS | Status: AC
  Filled 2015-01-22: qty 1

## 2015-01-22 MED ORDER — PROMETHAZINE HCL 25 MG/ML IJ SOLN
INTRAMUSCULAR | Status: AC
  Administered 2015-01-22: 25 mg via INTRAVENOUS
  Filled 2015-01-22: qty 1

## 2015-01-22 MED ORDER — MORPHINE SULFATE (PF) 4 MG/ML IV SOLN
4.0000 mg | Freq: Once | INTRAVENOUS | Status: AC
  Administered 2015-01-22: 4 mg via INTRAVENOUS

## 2015-01-22 MED ORDER — LIDOCAINE-EPINEPHRINE (PF) 1 %-1:200000 IJ SOLN
INTRAMUSCULAR | Status: AC
  Filled 2015-01-22: qty 30

## 2015-01-22 MED ORDER — ONDANSETRON HCL 4 MG/2ML IJ SOLN
INTRAMUSCULAR | Status: AC
  Filled 2015-01-22: qty 2

## 2015-01-22 MED ORDER — PROMETHAZINE HCL 25 MG/ML IJ SOLN
25.0000 mg | Freq: Once | INTRAMUSCULAR | Status: AC
  Administered 2015-01-22: 25 mg via INTRAVENOUS

## 2015-01-22 MED ORDER — VANCOMYCIN HCL IN DEXTROSE 1-5 GM/200ML-% IV SOLN
1000.0000 mg | Freq: Once | INTRAVENOUS | Status: AC
  Administered 2015-01-22: 1000 mg via INTRAVENOUS
  Filled 2015-01-22: qty 200

## 2015-01-22 MED ORDER — VANCOMYCIN HCL IN DEXTROSE 1-5 GM/200ML-% IV SOLN
INTRAVENOUS | Status: AC
  Filled 2015-01-22: qty 200

## 2015-01-22 MED ORDER — HYDROCODONE-ACETAMINOPHEN 5-325 MG PO TABS: 1.0000 | ORAL_TABLET | ORAL | Status: DC | PRN

## 2015-01-22 NOTE — ED Notes (Signed)
Pt will be d/c home following administration of vanc and phenergan. Pt made aware and verbalized understanding at this time. Pt verbalized no further needs.

## 2015-01-22 NOTE — ED Notes (Signed)
Lidocaine pulled and given to MD paduchowski at this time.

## 2015-01-22 NOTE — Discharge Instructions (Signed)
Please follow-up with your primary care physician in 2 days for recheck of your left arm cellulitis/abscess. Return to the emergency department for any worsening redness, swelling, vomiting unable to keep down your antibiotics, or fever.   Abscess An abscess (boil or furuncle) is an infected area on or under the skin. This area is filled with yellowish-white fluid (pus) and other material (debris). HOME CARE   Only take medicines as told by your doctor.  If you were given antibiotic medicine, take it as directed. Finish the medicine even if you start to feel better.  If gauze is used, follow your doctor's directions for changing the gauze.  To avoid spreading the infection:  Keep your abscess covered with a bandage.  Wash your hands well.  Do not share personal care items, towels, or whirlpools with others.  Avoid skin contact with others.  Keep your skin and clothes clean around the abscess.  Keep all doctor visits as told. GET HELP RIGHT AWAY IF:   You have more pain, puffiness (swelling), or redness in the wound site.  You have more fluid or blood coming from the wound site.  You have muscle aches, chills, or you feel sick.  You have a fever. MAKE SURE YOU:   Understand these instructions.  Will watch your condition.  Will get help right away if you are not doing well or get worse. Document Released: 10/21/2007 Document Revised: 11/03/2011 Document Reviewed: 07/17/2011 [facility] Patient Information 2015 [place], [place]. This information is not intended to replace advice given to you by your health care provider. Make sure you discuss any questions you have with your health care provider.  Cellulitis Cellulitis is an infection of the skin and the tissue under the skin. The infected area is usually red and tender. This happens most often in the arms and lower legs. HOME CARE   Take your antibiotic medicine as told. Finish the medicine even if you start to feel  better.  Keep the infected arm or leg raised (elevated).  Put a warm cloth on the area up to 4 times per day.  Only take medicines as told by your doctor.  Keep all doctor visits as told. GET HELP IF:  You see red streaks on the skin coming from the infected area.  Your red area gets bigger or turns a dark color.  Your bone or joint under the infected area is painful after the skin heals.  Your infection comes back in the same area or different area.  You have a puffy (swollen) bump in the infected area.  You have new symptoms.  You have a fever. GET HELP RIGHT AWAY IF:   You feel very sleepy.  You throw up (vomit) or have watery poop (diarrhea).  You feel sick and have muscle aches and pains. MAKE SURE YOU:   Understand these instructions.  Will watch your condition.  Will get help right away if you are not doing well or get worse. Document Released: 10/21/2007 Document Revised: 09/18/2013 Document Reviewed: 07/20/2011 [facility] Patient Information 2015 [place], [place]. This information is not intended to replace advice given to you by your health care provider. Make sure you discuss any questions you have with your health care provider.

## 2015-01-22 NOTE — ED Provider Notes (Signed)
[facility] Emergency Department Provider Note  Time seen: 3:47 PM  I have reviewed the triage vital signs and the nursing notes.   HISTORY  Chief Complaint Cellulitis    HPI Trevor Trevor is a 44 y.o. male with a past medical history of hypertension who presents the emergency department with left arm cellulitis. According to the patient he has had a red swollen area for the past 7 days. He saw his primary care doctor 4 days, was placed on doxycycline. He states it has continued to worsen, subjective chills at night. And he feels that the redness is spreading and enlarging so he came to the emergency department for evaluation. Patient has been taking doxycycline for 4 days without improvement. Denies any nausea or vomiting. Denies abdominal pain. States chills but denies taking his temperature. Describes the pain as moderate, dull worse with touching it or movement of the arm.     Past Medical History  Diagnosis Date  . HTN (hypertension) 01/07/2015    Patient Active Problem List   Diagnosis Date Noted  . HTN (hypertension) 01/07/2015  . Vitamin B12 deficiency 01/03/2015  . 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 Major depressive disorder, recurrent, severe without psychotic behavior 01/02/2015  . Hypothyroidism 01/02/2015  . Opioid use disorder, mild, abuse 01/02/2015  . Rotator cuff injury 01/02/2015  . 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 Laparoscopic gastric sleeve resection      Current Outpatient Rx  Name  Route  Sig  Dispense  Refill  . amLODipine (NORVASC) 10 MG tablet   Oral   Take 1 tablet (10 mg total) by mouth daily.   30 tablet   0   . atomoxetine (STRATTERA) 40 MG capsule   Oral   Take 1 capsule (40 mg total) by mouth daily.   30 capsule   0   . cyanocobalamin (,VITAMIN B-12,) 1000 MCG/ML injection    Subcutaneous   Inject 1 mL (1,000 mcg total) into the skin daily.   1 mL   4   . hydrOXYzine (ATARAX/VISTARIL) 25 MG tablet   Oral   Take 1 tablet (25 mg total) by mouth 3 (three) times daily as needed for anxiety (insomnia or anxiety).   90 tablet   0   . levothyroxine (SYNTHROID, LEVOTHROID) 150 MCG tablet   Oral   Take 1 tablet (150 mcg total) by mouth daily before breakfast.   30 tablet   0   . sertraline (ZOLOFT) 50 MG tablet   Oral   Take 1 tablet (50 mg total) by mouth at bedtime.   30 tablet   0   . venlafaxine XR (EFFEXOR-XR) 37.5 MG 24 hr capsule   Oral   Take 1 capsule (37.5 mg total) by mouth daily with breakfast.   30 capsule   0     Allergies Nsaids  Family History  Problem Relation Age of Onset  . Diabetes Mother   . Hypertension Mother   . Cancer Mother   . Cancer Father     Social History Social History  Substance Use Topics  . Smoking status: Never Smoker   . Smokeless tobacco: None  . Alcohol Use: Yes     Comment: socially    Review of Systems Constitutional: Positive for chills. Cardiovascular: Negative for chest pain. Respiratory: Negative for shortness of breath. Gastrointestinal: Negative for abdominal pain. Negative  for nausea or vomiting. Musculoskeletal: Positive for left arm pain. Skin: Redness and swelling to left forearm. Neurological: Negative for headache 10-point ROS otherwise negative.  ____________________________________________   PHYSICAL EXAM:  VITAL SIGNS: ED Triage Vitals  Enc Vitals Group     BP 01/22/15 1502 134/87 mmHg     Pulse Rate 01/22/15 1502 76     Resp 01/22/15 1502 20     Temp 01/22/15 1502 98.1 F (36.7 C)     Temp Source 01/22/15 1502 Oral     SpO2 01/22/15 1502 95 %     Weight 01/22/15 1502 225 lb (102.059 kg)     Height 01/22/15 1502 5\' 9"  (1.753 m)     Head Cir --      Peak Flow --      Pain Score 01/22/15 1502 7     Pain Loc --      Pain Edu? --      Excl. in GC? --      Constitutional: Alert and oriented. Well appearing and in no distress. Eyes: Normal exam ENT   Mouth/Throat: Mucous membranes are moist. Cardiovascular: Normal rate, regular rhythm. No murmur Respiratory: Normal respiratory effort without tachypnea nor retractions. Breath sounds are clear and equal bilaterally. No wheezes/rales/rhonchi. Gastrointestinal: Soft and nontender. No distention.   Musculoskeletal: Normal range of motion in all joints. Neurologic:  Normal speech and language. No gross focal neurologic deficits are appreciated. Speech is normal. Skin:  On left forearm Patient has approximately an 8 cm diameter area of erythema, swelling, moderate tenderness palpation, with several areas which appeared be pointing. Psychiatric: Mood and affect are normal. Speech and behavior are normal.  ____________________________________________   INITIAL IMPRESSION / ASSESSMENT AND PLAN / ED COURSE  Pertinent labs & imaging results that were available during my care of the patient were reviewed by me and considered in my medical decision making (see chart for details).  Exam most consistent with cellulitis and abscess of the left forearm. Bedside ultrasound used showing a fairly large pocket approximately 4 cm wide by 0.5-1 cm deep. Incision and drainage performed at bedside with approximately 15-20 cc of pus removed. Patient tolerated procedure very well. I feel the patient did not respond to oral antibiotics due to the localized abscess. Now the abscess has been drained I believe the patient should do very well with oral antibiotics. Given the patient was sent to the emergency department by his primary care physician, and the patient states the redness is worsening, we will check labs, treat with IV vancomycin in the emergency department, with plan to likely discharge on clindamycin with primary care follow-up in 2 days for recheck. Patient agreeable to plan.   INCISION AND  DRAINAGE Performed by: Minna Antis Consent: Verbal consent obtained. Risks and benefits: risks, benefits and alternatives were discussed Type: abscess  Body area: Left forearm  Anesthesia: local infiltration  Incision was made with a #11 scalpel.  Local anesthetic: lidocaine 1 % with epinephrine  Anesthetic total: 5 ml  Complexity: Simple   Drainage: purulent  Drainage amount: 15-20 cc purulent material   Packing material: None   Patient tolerance: Patient tolerated the procedure well with no immediate complications.    ____________________________________________   FINAL CLINICAL IMPRESSION(S) / ED DIAGNOSES  Cellulitis Abscess   Minna Antis, MD 01/22/15 [phone]

## 2015-01-22 NOTE — ED Notes (Signed)
Pt reports onset of irritated area on left forearm beginning 10 days ago. Pt reports being unsure if area was a bug bite. Pt has taken four days of oral antibiotics and reports area has continued to grow in size and pain has increased. Area is red and tender to the touch. Pt reports fevers and sweats over the past 4 days. No fever at this time but area of left forearm is warm to the touch.

## 2015-01-22 NOTE — ED Notes (Signed)
Pt has history of multiple skin infections/cellulitis, pt has large red swollen area to left elbow, pt currently on PO antibiotics with no results, pt sent to ER for IV antibiotics

## 2015-01-29 ENCOUNTER — Encounter: Payer: Self-pay | Admitting: *Deleted

## 2015-01-29 ENCOUNTER — Encounter
Admission: RE | Admit: 2015-01-29 | Discharge: 2015-01-29 | Disposition: A | Discharge status: Home / Self Care | Payer: Worker's Compensation | Source: Ambulatory Visit | Attending: Orthopedic Surgery | Admitting: Orthopedic Surgery

## 2015-01-29 DIAGNOSIS — Z01818 Encounter for other preprocedural examination: Secondary | ICD-10-CM | POA: Insufficient documentation

## 2015-01-29 LAB — APTT: APTT: 29 s (ref 24–36)

## 2015-01-29 LAB — SURGICAL PCR SCREEN
MRSA, PCR: NEGATIVE
STAPHYLOCOCCUS AUREUS: NEGATIVE

## 2015-01-29 LAB — URINALYSIS COMPLETE WITH MICROSCOPIC (ARMC ONLY)
Bacteria, UA: NONE SEEN
Bilirubin Urine: NEGATIVE
GLUCOSE, UA: NEGATIVE mg/dL
HGB URINE DIPSTICK: NEGATIVE
KETONES UR: NEGATIVE mg/dL
LEUKOCYTES UA: NEGATIVE
NITRITE: NEGATIVE
Protein, ur: NEGATIVE mg/dL
RBC / HPF: NONE SEEN RBC/hpf (ref 0–5)
SPECIFIC GRAVITY, URINE: 1.013 (ref 1.005–1.030)
pH: 6 (ref 5.0–8.0)

## 2015-01-29 LAB — PROTIME-INR
INR: 1.11
Prothrombin Time: 14.5 seconds (ref 11.4–15.0)

## 2015-01-29 NOTE — Patient Instructions (Signed)
  Your procedure is scheduled on: 01-31-15 Report to [facility] SAME [facility]GERY 2ND FLOOR To find out your arrival time please call [phone] between 1PM - 3PM on 01-30-15 [facility])  Remember: Instructions that are not followed completely may result in serious medical risk, up to and including death, or upon the discretion of your surgeon and anesthesiologist your surgery may need to be rescheduled.    __X__ 1. Do not eat food or drink liquids after midnight. No gum chewing or hard candies.     __X__ 2. No Alcohol for 24 hours before or after surgery.   ____ 3. Bring all medications with you on the day of surgery if instructed.    __X__ 4. Notify your doctor if there is any change in your medical condition     (cold, fever, infections).     Do not wear jewelry, make-up, hairpins, clips or nail polish.  Do not wear lotions, powders, or perfumes. You may wear deodorant.  Do not shave 48 hours prior to surgery. Men may shave face and neck.  Do not bring valuables to the hospital.    [facility] is not responsible for any belongings or valuables.               Contacts, dentures or bridgework may not be worn into surgery.  Leave your suitcase in the car. After surgery it may be brought to your room.  For patients admitted to the hospital, discharge time is determined by your treatment team.   Patients discharged the day of surgery will not be allowed to drive home.   Please read over the following fact sheets that you were given:      _X___ Take these medicines the morning of surgery with A SIP OF WATER:    1. LEVOTHYROXINE (SYNTHROID)  2. MAY TAKE HYDROXYZINE IF NEEDED  3.   4.  5.  6.  ____ Fleet Enema (as directed)   __X__ Use CHG Soap as directed  ____ Use inhalers on the day of surgery  ____ Stop metformin 2 days prior to surgery    ____ Take 1/2 of usual insulin dose the night before surgery and none on the morning of surgery.   ____ Stop  Coumadin/Plavix/aspirin-N/A  ____ Stop Anti-inflammatories-NO NSAIDS OR ASPIRIN PRODUCTS-TYLENOL OK   ____ Stop supplements until after surgery.    ____ Bring C-Pap to the hospital.

## 2015-02-05 ENCOUNTER — Encounter: Payer: Self-pay | Admitting: *Deleted

## 2015-02-06 ENCOUNTER — Encounter
Admission: RE | Disposition: A | Discharge status: Home / Self Care | Payer: Self-pay | Source: Ambulatory Visit | Attending: Orthopedic Surgery

## 2015-02-06 ENCOUNTER — Ambulatory Visit: Payer: Worker's Compensation | Admitting: Anesthesiology

## 2015-02-06 ENCOUNTER — Encounter: Payer: Self-pay | Admitting: *Deleted

## 2015-02-06 ENCOUNTER — Ambulatory Visit
Admission: RE | Admit: 2015-02-06 | Discharge: 2015-02-06 | Disposition: A | Discharge status: Home / Self Care | Payer: Worker's Compensation | Source: Ambulatory Visit | Attending: Orthopedic Surgery | Admitting: Orthopedic Surgery

## 2015-02-06 DIAGNOSIS — Z8349 Family history of other endocrine, nutritional and metabolic diseases: Secondary | ICD-10-CM | POA: Insufficient documentation

## 2015-02-06 DIAGNOSIS — K821 Hydrops of gallbladder: Secondary | ICD-10-CM | POA: Insufficient documentation

## 2015-02-06 DIAGNOSIS — Z833 Family history of diabetes mellitus: Secondary | ICD-10-CM | POA: Insufficient documentation

## 2015-02-06 DIAGNOSIS — L03114 Cellulitis of left upper limb: Secondary | ICD-10-CM | POA: Diagnosis not present

## 2015-02-06 DIAGNOSIS — Z8249 Family history of ischemic heart disease and other diseases of the circulatory system: Secondary | ICD-10-CM | POA: Insufficient documentation

## 2015-02-06 DIAGNOSIS — Y999 Unspecified external cause status: Secondary | ICD-10-CM | POA: Diagnosis not present

## 2015-02-06 DIAGNOSIS — S46111A Strain of muscle, fascia and tendon of long head of biceps, right arm, initial encounter: Secondary | ICD-10-CM | POA: Insufficient documentation

## 2015-02-06 DIAGNOSIS — E780 Pure hypercholesterolemia: Secondary | ICD-10-CM | POA: Insufficient documentation

## 2015-02-06 DIAGNOSIS — Y929 Unspecified place or not applicable: Secondary | ICD-10-CM | POA: Insufficient documentation

## 2015-02-06 DIAGNOSIS — M75121 Complete rotator cuff tear or rupture of right shoulder, not specified as traumatic: Secondary | ICD-10-CM | POA: Diagnosis not present

## 2015-02-06 DIAGNOSIS — Z79899 Other long term (current) drug therapy: Secondary | ICD-10-CM | POA: Diagnosis not present

## 2015-02-06 DIAGNOSIS — F40240 Claustrophobia: Secondary | ICD-10-CM | POA: Diagnosis not present

## 2015-02-06 DIAGNOSIS — K219 Gastro-esophageal reflux disease without esophagitis: Secondary | ICD-10-CM | POA: Diagnosis not present

## 2015-02-06 DIAGNOSIS — Y939 Activity, unspecified: Secondary | ICD-10-CM | POA: Insufficient documentation

## 2015-02-06 DIAGNOSIS — K760 Fatty (change of) liver, not elsewhere classified: Secondary | ICD-10-CM | POA: Insufficient documentation

## 2015-02-06 DIAGNOSIS — M7551 Bursitis of right shoulder: Secondary | ICD-10-CM | POA: Insufficient documentation

## 2015-02-06 DIAGNOSIS — E039 Hypothyroidism, unspecified: Secondary | ICD-10-CM | POA: Insufficient documentation

## 2015-02-06 DIAGNOSIS — G4730 Sleep apnea, unspecified: Secondary | ICD-10-CM | POA: Diagnosis not present

## 2015-02-06 DIAGNOSIS — X58XXXA Exposure to other specified factors, initial encounter: Secondary | ICD-10-CM | POA: Insufficient documentation

## 2015-02-06 DIAGNOSIS — F419 Anxiety disorder, unspecified: Secondary | ICD-10-CM | POA: Diagnosis not present

## 2015-02-06 HISTORY — DX: Headache: R51

## 2015-02-06 HISTORY — DX: Sleep apnea, unspecified: G47.30

## 2015-02-06 HISTORY — PX: SHOULDER ARTHROSCOPY WITH OPEN ROTATOR CUFF REPAIR: SHX6092

## 2015-02-06 HISTORY — DX: Methicillin resistant Staphylococcus aureus infection, unspecified site: A49.02

## 2015-02-06 HISTORY — DX: Headache, unspecified: R51.9

## 2015-02-06 HISTORY — DX: Hypothyroidism, unspecified: E03.9

## 2015-02-06 HISTORY — DX: Gastro-esophageal reflux disease without esophagitis: K21.9

## 2015-02-06 HISTORY — DX: Cellulitis, unspecified: L03.90

## 2015-02-06 HISTORY — DX: Anxiety disorder, unspecified: F41.9

## 2015-02-06 LAB — URINE DRUG SCREEN, QUALITATIVE (ARMC ONLY)
Amphetamines, Ur Screen: NOT DETECTED
BARBITURATES, UR SCREEN: NOT DETECTED
BENZODIAZEPINE, UR SCRN: POSITIVE — AB
CANNABINOID 50 NG, UR ~~LOC~~: NOT DETECTED
COCAINE METABOLITE, UR ~~LOC~~: NOT DETECTED
MDMA (Ecstasy)Ur Screen: NOT DETECTED
METHADONE SCREEN, URINE: NOT DETECTED
OPIATE, UR SCREEN: NOT DETECTED
PHENCYCLIDINE (PCP) UR S: NOT DETECTED
Tricyclic, Ur Screen: POSITIVE — AB

## 2015-02-06 SURGERY — ARTHROSCOPY, SHOULDER WITH REPAIR, ROTATOR CUFF, OPEN
Anesthesia: General | Site: Shoulder | Laterality: Right | Wound class: Clean

## 2015-02-06 MED ORDER — BUPIVACAINE HCL (PF) 0.5 % IJ SOLN
INTRAMUSCULAR | Status: AC
  Filled 2015-02-06: qty 30

## 2015-02-06 MED ORDER — LIDOCAINE HCL 1 % IJ SOLN
INTRAMUSCULAR | Status: DC | PRN
  Administered 2015-02-06: 10 mL

## 2015-02-06 MED ORDER — NEOMYCIN-POLYMYXIN B GU 40-200000 IR SOLN
Status: AC
  Filled 2015-02-06: qty 2

## 2015-02-06 MED ORDER — CEFAZOLIN SODIUM-DEXTROSE 2-3 GM-% IV SOLR
INTRAVENOUS | Status: AC
  Filled 2015-02-06: qty 50

## 2015-02-06 MED ORDER — BUPIVACAINE HCL (PF) 0.25 % IJ SOLN
INTRAMUSCULAR | Status: AC
  Filled 2015-02-06: qty 30

## 2015-02-06 MED ORDER — FENTANYL CITRATE (PF) 100 MCG/2ML IJ SOLN
INTRAMUSCULAR | Status: DC | PRN
  Administered 2015-02-06: 100 ug via INTRAVENOUS
  Administered 2015-02-06: 50 ug via INTRAVENOUS

## 2015-02-06 MED ORDER — FENTANYL CITRATE (PF) 100 MCG/2ML IJ SOLN
INTRAMUSCULAR | Status: AC
  Administered 2015-02-06: 25 ug via INTRAVENOUS
  Filled 2015-02-06: qty 2

## 2015-02-06 MED ORDER — ACETAMINOPHEN 650 MG RE SUPP: 650.0000 mg | Freq: Four times a day (QID) | RECTAL | Status: DC | PRN

## 2015-02-06 MED ORDER — LACTATED RINGERS IV SOLN
INTRAVENOUS | Status: DC
  Administered 2015-02-06: 13:00:00 via INTRAVENOUS

## 2015-02-06 MED ORDER — ROCURONIUM BROMIDE 100 MG/10ML IV SOLN
INTRAVENOUS | Status: DC | PRN
  Administered 2015-02-06: 20 mg via INTRAVENOUS
  Administered 2015-02-06: 10 mg via INTRAVENOUS
  Administered 2015-02-06: 40 mg via INTRAVENOUS
  Administered 2015-02-06: 10 mg via INTRAVENOUS

## 2015-02-06 MED ORDER — MIDAZOLAM HCL 2 MG/2ML IJ SOLN
INTRAMUSCULAR | Status: DC | PRN
  Administered 2015-02-06: 2 mg via INTRAVENOUS
  Administered 2015-02-06: 1 mg via INTRAVENOUS

## 2015-02-06 MED ORDER — SODIUM CHLORIDE 0.9 % IV SOLN: INTRAVENOUS | Status: DC

## 2015-02-06 MED ORDER — ALUM & MAG HYDROXIDE-SIMETH 200-200-20 MG/5ML PO SUSP: 30.0000 mL | ORAL | Status: DC | PRN

## 2015-02-06 MED ORDER — PROPOFOL 10 MG/ML IV BOLUS
INTRAVENOUS | Status: DC | PRN
  Administered 2015-02-06: 150 mg via INTRAVENOUS

## 2015-02-06 MED ORDER — DOCUSATE SODIUM 100 MG PO CAPS: 100.0000 mg | ORAL_CAPSULE | Freq: Two times a day (BID) | ORAL | Status: DC

## 2015-02-06 MED ORDER — OXYCODONE HCL 5 MG PO TABS: 5.0000 mg | ORAL_TABLET | ORAL | Status: DC | PRN

## 2015-02-06 MED ORDER — EPINEPHRINE HCL 1 MG/ML IJ SOLN
INTRAMUSCULAR | Status: DC | PRN
  Administered 2015-02-06: 10 mL

## 2015-02-06 MED ORDER — LIDOCAINE HCL (PF) 1 % IJ SOLN
INTRAMUSCULAR | Status: AC
  Filled 2015-02-06: qty 30

## 2015-02-06 MED ORDER — FAMOTIDINE 20 MG PO TABS
ORAL_TABLET | ORAL | Status: AC
  Administered 2015-02-06: 20 mg via ORAL
  Filled 2015-02-06: qty 1

## 2015-02-06 MED ORDER — OXYCODONE HCL 5 MG PO TABS
ORAL_TABLET | ORAL | Status: AC
  Filled 2015-02-06: qty 2

## 2015-02-06 MED ORDER — CEFAZOLIN SODIUM-DEXTROSE 2-3 GM-% IV SOLR
2.0000 g | Freq: Once | INTRAVENOUS | Status: AC
  Administered 2015-02-06: 2 g via INTRAVENOUS

## 2015-02-06 MED ORDER — ONDANSETRON HCL 4 MG/2ML IJ SOLN: 4.0000 mg | Freq: Four times a day (QID) | INTRAMUSCULAR | Status: DC | PRN

## 2015-02-06 MED ORDER — SUGAMMADEX SODIUM 200 MG/2ML IV SOLN
INTRAVENOUS | Status: DC | PRN
  Administered 2015-02-06 (×2): 100 mg via INTRAVENOUS

## 2015-02-06 MED ORDER — EPINEPHRINE HCL 1 MG/ML IJ SOLN
INTRAMUSCULAR | Status: AC
  Filled 2015-02-06: qty 1

## 2015-02-06 MED ORDER — ONDANSETRON HCL 4 MG PO TABS: 4.0000 mg | ORAL_TABLET | Freq: Four times a day (QID) | ORAL | Status: DC | PRN

## 2015-02-06 MED ORDER — ONDANSETRON HCL 4 MG/2ML IJ SOLN
INTRAMUSCULAR | Status: DC | PRN
  Administered 2015-02-06: 4 mg via INTRAVENOUS

## 2015-02-06 MED ORDER — DIPHENHYDRAMINE HCL 12.5 MG/5ML PO ELIX: 12.5000 mg | ORAL_SOLUTION | ORAL | Status: DC | PRN

## 2015-02-06 MED ORDER — ONDANSETRON HCL 4 MG/2ML IJ SOLN: 4.0000 mg | Freq: Once | INTRAMUSCULAR | Status: DC | PRN

## 2015-02-06 MED ORDER — FAMOTIDINE 20 MG PO TABS
20.0000 mg | ORAL_TABLET | Freq: Once | ORAL | Status: AC
  Administered 2015-02-06: 20 mg via ORAL

## 2015-02-06 MED ORDER — PHENOL 1.4 % MT LIQD: 1.0000 | OROMUCOSAL | Status: DC | PRN

## 2015-02-06 MED ORDER — ACETAMINOPHEN 325 MG PO TABS: 650.0000 mg | ORAL_TABLET | Freq: Four times a day (QID) | ORAL | Status: DC | PRN

## 2015-02-06 MED ORDER — OXYCODONE HCL 5 MG PO TABS
5.0000 mg | ORAL_TABLET | ORAL | Status: DC | PRN
  Administered 2015-02-06: 10 mg via ORAL

## 2015-02-06 MED ORDER — FENTANYL CITRATE (PF) 100 MCG/2ML IJ SOLN
25.0000 ug | INTRAMUSCULAR | Status: DC | PRN
  Administered 2015-02-06 (×4): 25 ug via INTRAVENOUS

## 2015-02-06 MED ORDER — MENTHOL 3 MG MT LOZG: 1.0000 | LOZENGE | OROMUCOSAL | Status: DC | PRN

## 2015-02-06 SURGICAL SUPPLY — 64 items
ADAPTER IRRIG TUBE 2 SPIKE SOL (ADAPTER) ×4 IMPLANT
ADPR TBG 2 SPK PMP STRL ASCP (ADAPTER) ×2
ANCHOR DBLROW ROT CUFF 2.8 MAG (Anchor) ×5 IMPLANT
BUR RADIUS 4.0X18.5 (BURR) ×2 IMPLANT
BUR RADIUS 5.5 (BURR) ×2 IMPLANT
CANNULA 5.75X7 CRYSTAL CLEAR (CANNULA) ×3 IMPLANT
CANNULA PARTIAL THREAD 2X7 (CANNULA) ×1 IMPLANT
CANNULA TWIST IN 8.25X9CM (CANNULA) ×2 IMPLANT
CONNECTOR M SMARTSTITCH (Connector) ×3 IMPLANT
COOLER POLAR GLACIER W/PUMP (MISCELLANEOUS) ×2 IMPLANT
DRAPE IMP U-DRAPE 54X76 (DRAPES) ×4 IMPLANT
DRAPE INCISE IOBAN 66X45 STRL (DRAPES) ×2 IMPLANT
DRAPE U-SHAPE 47X51 STRL (DRAPES) ×2 IMPLANT
DURAPREP 26ML APPLICATOR (WOUND CARE) ×7 IMPLANT
GAUZE PETRO XEROFOAM 1X8 (MISCELLANEOUS) ×2 IMPLANT
GAUZE SPONGE 4X4 12PLY STRL (GAUZE/BANDAGES/DRESSINGS) ×4 IMPLANT
GLOVE BIOGEL PI IND STRL 9 (GLOVE) ×1 IMPLANT
GLOVE BIOGEL PI INDICATOR 9 (GLOVE) ×1
GLOVE SURG 9.0 ORTHO LTXF (GLOVE) ×4 IMPLANT
GOWN STRL REUS TWL 2XL XL LVL4 (GOWN DISPOSABLE) ×2 IMPLANT
GOWN STRL REUS W/ TWL LRG LVL3 (GOWN DISPOSABLE) ×1 IMPLANT
GOWN STRL REUS W/ TWL LRG LVL4 (GOWN DISPOSABLE) ×1 IMPLANT
GOWN STRL REUS W/TWL LRG LVL3 (GOWN DISPOSABLE) ×2
GOWN STRL REUS W/TWL LRG LVL4 (GOWN DISPOSABLE) ×2
IV LACTATED RINGER IRRG 3000ML (IV SOLUTION) ×12
IV LR IRRIG 3000ML ARTHROMATIC (IV SOLUTION) ×6 IMPLANT
KIT RM TURNOVER STRD PROC AR (KITS) ×2 IMPLANT
KIT STABILIZATION SHOULDER (MISCELLANEOUS) ×2 IMPLANT
MANIFOLD NEPTUNE II (INSTRUMENTS) ×2 IMPLANT
MASK FACE SPIDER DISP (MASK) ×2 IMPLANT
MAT BLUE FLOOR 46X72 FLO (MISCELLANEOUS) ×2 IMPLANT
NDL SAFETY 18GX1.5 (NEEDLE) ×2 IMPLANT
NDL SAFETY 22GX1.5 (NEEDLE) ×2 IMPLANT
NS IRRIG 500ML POUR BTL (IV SOLUTION) ×2 IMPLANT
PACK ARTHROSCOPY SHOULDER (MISCELLANEOUS) ×2 IMPLANT
PAD GROUND ADULT SPLIT (MISCELLANEOUS) ×2 IMPLANT
PAD WRAPON POLAR SHDR UNIV (MISCELLANEOUS) ×1 IMPLANT
PASSER SUT CAPTURE FIRST (SUTURE) ×2 IMPLANT
SET TUBE SUCT SHAVER OUTFL 24K (TUBING) ×2 IMPLANT
SET TUBE TIP INTRA-ARTICULAR (MISCELLANEOUS) ×2 IMPLANT
SLING ULTRA II LG (MISCELLANEOUS) ×1 IMPLANT
SLING ULTRA II M (MISCELLANEOUS) ×1 IMPLANT
SMARTSTITCH CONNECTOR IMPLANT
STRIP CLOSURE SKIN 1/2X4 (GAUZE/BANDAGES/DRESSINGS) ×2 IMPLANT
SUT CO BRAID (SUTURE) ×2 IMPLANT
SUT ETHIBOND 0 36 GRN (SUTURE) ×1 IMPLANT
SUT ETHILON 4-0 (SUTURE)
SUT ETHILON 4-0 FS2 18XMFL BLK (SUTURE)
SUT KNTLS 2.8 MAGNUM (Anchor) ×8 IMPLANT
SUT MNCRL 4-0 (SUTURE) ×2
SUT MNCRL 4-0 27XMFL (SUTURE) ×1
SUT PDS AB 0 CT1 27 (SUTURE) ×3 IMPLANT
SUT VIC AB 0 CT1 36 (SUTURE) ×4 IMPLANT
SUT VIC AB 2-0 CT2 27 (SUTURE) ×2 IMPLANT
SUTURE ETHLN 4-0 FS2 18XMF BLK (SUTURE) ×1 IMPLANT
SUTURE MAGNUM WIRE 2X48 BLK (SUTURE) ×2 IMPLANT
SUTURE MNCRL 4-0 27XMF (SUTURE) ×1 IMPLANT
SUTURE OPUS MAGNUM SZ 2 WHT (SUTURE) ×6 IMPLANT
SYRINGE 10CC LL (SYRINGE) ×2 IMPLANT
TAPE MICROFOAM 4IN (TAPE) ×2 IMPLANT
TUBING ARTHRO INFLOW-ONLY STRL (TUBING) ×2 IMPLANT
TUBING CONNECTING 10 (TUBING) ×2 IMPLANT
WAND HAND CNTRL MULTIVAC 90 (MISCELLANEOUS) ×2 IMPLANT
WRAPON POLAR PAD SHDR UNIV (MISCELLANEOUS) ×2

## 2015-02-06 NOTE — Anesthesia Preprocedure Evaluation (Addendum)
Anesthesia Evaluation  Patient identified by MRN, date of birth, ID band Patient awake    Reviewed: Allergy & Precautions, NPO status , Patient's Chart, lab work & pertinent test results, reviewed documented beta blocker date and time   Airway Mallampati: III  TM Distance: >3 FB     Dental  (+) Chipped, Partial Upper, Partial Lower   Pulmonary sleep apnea ,           Cardiovascular hypertension, Pt. on medications      Neuro/Psych  Headaches, PSYCHIATRIC DISORDERS Anxiety Depression    GI/Hepatic GERD  ,  Endo/Other  Hypothyroidism   Renal/GU      Musculoskeletal   Abdominal   Peds  Hematology   Anesthesia Other Findings Told him very important to use CPAP tonite. Mom agrees. He is very sleepy at present. Did not sleep last nite.  Reproductive/Obstetrics                            Anesthesia Physical Anesthesia Plan  ASA: III  Anesthesia Plan: General   Post-op Pain Management:    Induction: Intravenous  Airway Management Planned: Oral ETT  Additional Equipment:   Intra-op Plan:   Post-operative Plan:   Informed Consent: I have reviewed the patients History and Physical, chart, labs and discussed the procedure including the risks, benefits and alternatives for the proposed anesthesia with the patient or authorized representative who has indicated his/her understanding and acceptance.     Plan Discussed with: CRNA  Anesthesia Plan Comments:         Anesthesia Quick Evaluation

## 2015-02-06 NOTE — Anesthesia Procedure Notes (Signed)
Date/Time: 02/06/2015 1:29 PM Performed by: Omer Jack Pre-anesthesia Checklist: Patient identified, Emergency Drugs available, Suction available, Patient being monitored and Timeout performed Patient Re-evaluated:Patient Re-evaluated prior to inductionOxygen Delivery Method: Circle system utilized Preoxygenation: Pre-oxygenation with 100% oxygen Intubation Type: IV induction Ventilation: Mask ventilation without difficulty Laryngoscope Size: Mac and 3 Grade View: Grade II Tube type: Oral Tube size: 7.5 mm Number of attempts: 2 Airway Equipment and Method: Stylet Placement Confirmation: positive ETCO2,  ETT inserted through vocal cords under direct vision and breath sounds checked- equal and bilateral Secured at: 22 cm Tube secured with: Tape Dental Injury: Teeth and Oropharynx as per pre-operative assessment

## 2015-02-06 NOTE — Transfer of Care (Signed)
Immediate Anesthesia Transfer of Care Note  Patient: Trevor Torres  Procedure(s) Performed: Procedure(s): SHOULDER ARTHROSCOPY WITH OPEN ROTATOR CUFF REPAIR (Right)  Patient Location: PACU  Anesthesia Type:General  Level of Consciousness: sedated and responds to stimulation  Airway & Oxygen Therapy: Patient Spontanous Breathing and Patient connected to face mask oxygen  Post-op Assessment: Report given to RN and Post -op Vital signs reviewed and stable  Post vital signs: Reviewed and stable  Last Vitals:  Filed Vitals:   02/06/15 1627  BP: 146/94  Pulse:   Temp: 36.4 C  Resp:     Complications: No apparent anesthesia complications

## 2015-02-06 NOTE — Op Note (Signed)
02/06/2015  4:47 PM  PATIENT:  Trevor Torres  44 y.o. male  PRE-OPERATIVE DIAGNOSIS:  FULL THICKNESS ROTATOR CUFF TEAR, RIGHT SHOULDER  POST-OPERATIVE DIAGNOSIS:  FULL THICKNESS ROTATOR CUFF TEAR AND TORN BICEPS TENDON  PROCEDURE:  Procedure(s): RIGHT SHOULDER ARTHROSCOPY WITH MINI- OPEN ROTATOR CUFF REPAIR, SUBACROMIAL DECOMPRESSION AND DISTAL CLAVICLE EXCISION  SURGEON:  Surgeon(s) and Role:    * Thornton Park, MD - Primary  ANESTHESIA:   local and general   PREOPERATIVE INDICATIONS:  Trevor Torres is a  44 y.o. male with a diagnosis of [place] who failed conservative measures and elected for surgical management.    The risks benefits and alternatives were discussed with the patient preoperatively including but not limited to the risks of infection, bleeding, nerve injury, persistent pain or weakness, failure of the hardware, re-tear of the rotator cuff and the need for further surgery. Medical risks include DVT and pulmonary embolism, myocardial infarction, stroke, pneumonia, respiratory failure and death. Patient understood these risks and wished to proceed.  OPERATIVE IMPLANTS: ArthroCare Magnum 2 anchors 4 and Magnum M anchors 2  OPERATIVE FINDINGS: Full-thickness tear involving the supraspinatus extending into the infraspinatus. Partial thickness tear of the subscapularis involving the superior fibers and tear of the intra-articular portion of the long head of the biceps tendon, subacromial spur and acromioclavicular joint arthrosis.  OPERATIVE PROCEDURE: The patient was met in the preoperative area. The operative shoulder was signed with the word yes and my initials according the hospital's correct site of surgery protocol. Patient elected not to have an interscalene block.  Patient was brought to the operating room where they underwent general endotracheal intubation. He was placed in a beachchair position.  A spider arm positioner was used for this  case. Examination under anesthesia revealed no limitation of motion or instability with load shift testing. The patient had a negative sulcus sign.  Patient clinically appeared to have muscle atrophy involving the infraspinatus.  Patient was prepped and draped in a sterile fashion. A timeout was performed to verify the patient's name, date of birth, medical record number, correct site of surgery and correct procedure to be performed there was also used to verify the patient received antibiotics that all appropriate instruments, implants and radiographs studies were available in the room. Once all in attendance were in agreement case began.  Bony landmarks were drawn out with a surgical marker along with proposed arthroscopy incisions. These were pre-injected with 1% lidocaine plain. An 11 blade was used to establish a posterior portal through which the arthroscope was placed in the glenohumeral joint. A full diagnostic examination of the shoulder was performed.    The arthroscope was then placed in the subacromial space. Extensive bursitis was encountered and debrided using a 4-0 resector shaver blade and a 90 ArthroCare wand from a lateral portal which was established under direct visualization using an 18-gauge spinal needle. A subacromial decompression was also performed using a 5.5 mm resector shaver blade from the lateral portal. The greater tuberosity footprint was also debrided of torn rotator cuff fibers using the 5.5 mm resector shaver blade from the lateral portal. A distal clavicle excision was then performed also using the 5.5 mm resector shaver blade from the anterior portal.  Two Smart stitches were placed in the lateral border of the rotator cuff tear from the lateral portal. All arthroscopic instruments were then removed and the mini-open portion of the procedure began.   A saber-type incision was made along the  lateral border of the acromion. The deltoid muscle was identified and split in  line with its fibers which allowed visualization of the rotator cuff. The Smart stitches previously placed in the lateral border of the rotator cuff were brought out through the deltoid split.   Two Magnum M anchors were placed at the articular margin of the humeral head with the greater tuberosity. The four suture limbs of each Magnum M anchor were passed medially through the rotator cuff using a first pass suture passer.  The Smart stitches in the lateral border of the rotator cuff were then anchored to the humeral head using four Magnum 2 anchors. These anchors were tensioned to allow for anatomic reduction of the rotator cuff to the greater tuberosity. A #1 Ethibond was used to reinforce areas of the rotator cuff not secured by the Smart stitches. Arthroscopic images of the repair were taken with the arthroscope both externally and from inside the glenohumeral joint.  All incisions were copiously irrigated. The deltoid fascia was repaired using a 0 Vicryl suture.  The subcutaneous tissue of all incisions were closed with a 2-0 Vicryl. Skin closure for the arthroscopic incisions was performed with 4-0 nylon. The skin edges of the saber incision was approximated with a running 4-0 undyed Monocryl.  A dry sterile dressing was applied.  The patient was placed in an abduction sling, with a Polar Caresleeve, a TENS unit.  All sharp and it instrument counts were correct at the conclusion of the case. I was scrubbed and present for the entire case. I spoke with the patient's family postoperatively to let them know the case had gone without complication and the patient was stable in recovery room. Patient's family explained that he has a TENS unit and Polar Care sent to the house by workers comp. Since the Polar Care has already been applied to the patient's right shoulder in the OR this was left in place since it could not be reused.   Timoteo Gaul, MD

## 2015-02-06 NOTE — Discharge Instructions (Addendum)
Wear sling at all times, including sleep.  You will need to use the sling for a total of 4 weeks following surgery.  Do not try and lift your arm up away from your body for any reason.   Keep the dressing dry.  You may remove bandage in 3 days.  Leave the Steri-Strips (white medical tape) in place.  You may place additional Band-Aids over top of the Steri-Strips if you wish.  May shower once dressing is removed in 3 days.  Remove sling carefully only for showers, leaving arm down by your side while in the shower.  You are being prescribed oxycodone and may take 1-2 tablets every three hours as needed for pain.  You may alternate this medication with over the counter tylenol as instructed on the bottle.  If the the pain medication causes itching, or is too strong, try taking a single tablet at a time, or take Benadryl.  You may be most comfortable sleeping in a recliner.  If you do sleep in bed, place pillows behind the shoulder that had the operation to support it.

## 2015-02-06 NOTE — Anesthesia Postprocedure Evaluation (Signed)
  Anesthesia Post-op Note  Patient: Trevor Torres  Procedure(s) Performed: Procedure(s): SHOULDER ARTHROSCOPY WITH OPEN ROTATOR CUFF REPAIR (Right)  Anesthesia type:General  Patient location: PACU  Post pain: Pain level controlled  Post assessment: Post-op Vital signs reviewed, Patient's Cardiovascular Status Stable, Respiratory Function Stable, Patent Airway and No signs of Nausea or vomiting  Post vital signs: Reviewed and stable  Last Vitals:  Filed Vitals:   02/06/15 1805  BP: 137/85  Pulse: 68  Temp:   Resp: 16    Level of consciousness: awake, alert  and patient cooperative  Complications: No apparent anesthesia complications

## 2015-02-07 ENCOUNTER — Encounter: Payer: Self-pay | Admitting: Orthopedic Surgery

## 2015-02-07 ENCOUNTER — Observation Stay
Admission: EM | Admit: 2015-02-07 | Discharge: 2015-02-09 | Disposition: A | Discharge status: Home / Self Care | Payer: 59 | Attending: Orthopedic Surgery | Admitting: Orthopedic Surgery

## 2015-02-07 DIAGNOSIS — Z79899 Other long term (current) drug therapy: Secondary | ICD-10-CM | POA: Diagnosis not present

## 2015-02-07 DIAGNOSIS — Z833 Family history of diabetes mellitus: Secondary | ICD-10-CM | POA: Diagnosis not present

## 2015-02-07 DIAGNOSIS — Z809 Family history of malignant neoplasm, unspecified: Secondary | ICD-10-CM | POA: Diagnosis not present

## 2015-02-07 DIAGNOSIS — Z9119 Patient's noncompliance with other medical treatment and regimen: Secondary | ICD-10-CM | POA: Diagnosis not present

## 2015-02-07 DIAGNOSIS — G4730 Sleep apnea, unspecified: Secondary | ICD-10-CM | POA: Insufficient documentation

## 2015-02-07 DIAGNOSIS — F419 Anxiety disorder, unspecified: Secondary | ICD-10-CM | POA: Insufficient documentation

## 2015-02-07 DIAGNOSIS — M25511 Pain in right shoulder: Principal | ICD-10-CM | POA: Insufficient documentation

## 2015-02-07 DIAGNOSIS — Z8249 Family history of ischemic heart disease and other diseases of the circulatory system: Secondary | ICD-10-CM | POA: Insufficient documentation

## 2015-02-07 DIAGNOSIS — Z9889 Other specified postprocedural states: Secondary | ICD-10-CM | POA: Diagnosis not present

## 2015-02-07 DIAGNOSIS — I10 Essential (primary) hypertension: Secondary | ICD-10-CM | POA: Insufficient documentation

## 2015-02-07 DIAGNOSIS — Z886 Allergy status to analgesic agent status: Secondary | ICD-10-CM | POA: Diagnosis not present

## 2015-02-07 DIAGNOSIS — Z79891 Long term (current) use of opiate analgesic: Secondary | ICD-10-CM | POA: Insufficient documentation

## 2015-02-07 DIAGNOSIS — E538 Deficiency of other specified B group vitamins: Secondary | ICD-10-CM | POA: Diagnosis not present

## 2015-02-07 DIAGNOSIS — F332 Major depressive disorder, recurrent severe without psychotic features: Secondary | ICD-10-CM | POA: Diagnosis not present

## 2015-02-07 DIAGNOSIS — Z8614 Personal history of Methicillin resistant Staphylococcus aureus infection: Secondary | ICD-10-CM | POA: Diagnosis not present

## 2015-02-07 DIAGNOSIS — K219 Gastro-esophageal reflux disease without esophagitis: Secondary | ICD-10-CM | POA: Insufficient documentation

## 2015-02-07 DIAGNOSIS — E039 Hypothyroidism, unspecified: Secondary | ICD-10-CM | POA: Diagnosis not present

## 2015-02-07 DIAGNOSIS — Z9884 Bariatric surgery status: Secondary | ICD-10-CM | POA: Diagnosis not present

## 2015-02-07 DIAGNOSIS — M25519 Pain in unspecified shoulder: Secondary | ICD-10-CM | POA: Diagnosis present

## 2015-02-07 MED ORDER — ALUM & MAG HYDROXIDE-SIMETH 200-200-20 MG/5ML PO SUSP: 30.0000 mL | ORAL | Status: DC | PRN

## 2015-02-07 MED ORDER — HYDROMORPHONE HCL 1 MG/ML IJ SOLN
1.0000 mg | INTRAMUSCULAR | Status: DC | PRN
Start: 2015-02-07 — End: 2015-02-08
  Administered 2015-02-08 (×3): 1 mg via INTRAVENOUS
  Filled 2015-02-07 (×3): qty 1

## 2015-02-07 MED ORDER — METOCLOPRAMIDE HCL 5 MG/ML IJ SOLN
5.0000 mg | Freq: Three times a day (TID) | INTRAMUSCULAR | Status: DC | PRN
Start: 2015-02-07 — End: 2015-02-09
  Administered 2015-02-08: 10 mg via INTRAVENOUS
  Filled 2015-02-07: qty 2

## 2015-02-07 MED ORDER — ACETAMINOPHEN 325 MG PO TABS
650.0000 mg | ORAL_TABLET | Freq: Four times a day (QID) | ORAL | Status: DC | PRN
  Filled 2015-02-07 (×2): qty 2

## 2015-02-07 MED ORDER — ONDANSETRON HCL 4 MG PO TABS
4.0000 mg | ORAL_TABLET | Freq: Four times a day (QID) | ORAL | Status: DC | PRN
  Administered 2015-02-08: 4 mg via ORAL
  Filled 2015-02-07: qty 1

## 2015-02-07 MED ORDER — SODIUM CHLORIDE 0.9 % IV SOLN
INTRAVENOUS | Status: DC
  Administered 2015-02-08: 01:00:00 via INTRAVENOUS

## 2015-02-07 MED ORDER — ONDANSETRON 4 MG PO TBDP
4.0000 mg | ORAL_TABLET | Freq: Once | ORAL | Status: AC
  Administered 2015-02-07: 4 mg via ORAL
  Filled 2015-02-07: qty 1

## 2015-02-07 MED ORDER — HYDROMORPHONE HCL 1 MG/ML IJ SOLN
INTRAMUSCULAR | Status: AC
  Administered 2015-02-07: 1 mg via INTRAVENOUS
  Filled 2015-02-07: qty 1

## 2015-02-07 MED ORDER — OXYCODONE HCL 5 MG PO TABS
10.0000 mg | ORAL_TABLET | ORAL | Status: DC | PRN
  Administered 2015-02-08 (×3): 10 mg via ORAL
  Filled 2015-02-07 (×3): qty 2

## 2015-02-07 MED ORDER — ACETAMINOPHEN 500 MG PO TABS
1000.0000 mg | ORAL_TABLET | Freq: Four times a day (QID) | ORAL | Status: AC
  Administered 2015-02-08 (×3): 1000 mg via ORAL
  Filled 2015-02-07: qty 2

## 2015-02-07 MED ORDER — METOCLOPRAMIDE HCL 10 MG PO TABS: 5.0000 mg | ORAL_TABLET | Freq: Three times a day (TID) | ORAL | Status: DC | PRN

## 2015-02-07 MED ORDER — DIPHENHYDRAMINE HCL 12.5 MG/5ML PO ELIX
12.5000 mg | ORAL_SOLUTION | ORAL | Status: DC | PRN
  Administered 2015-02-08: 12.5 mg via ORAL
  Filled 2015-02-07: qty 5

## 2015-02-07 MED ORDER — CEFAZOLIN SODIUM 1-5 GM-% IV SOLN
1.0000 g | Freq: Four times a day (QID) | INTRAVENOUS | Status: AC
  Administered 2015-02-08 (×3): 1 g via INTRAVENOUS
  Filled 2015-02-07 (×3): qty 50

## 2015-02-07 MED ORDER — DOCUSATE SODIUM 100 MG PO CAPS
100.0000 mg | ORAL_CAPSULE | Freq: Two times a day (BID) | ORAL | Status: DC
  Administered 2015-02-08 – 2015-02-09 (×3): 100 mg via ORAL
  Filled 2015-02-07 (×3): qty 1

## 2015-02-07 MED ORDER — HYDROMORPHONE HCL 1 MG/ML IJ SOLN
1.0000 mg | Freq: Once | INTRAMUSCULAR | Status: AC
  Administered 2015-02-07: 1 mg via INTRAMUSCULAR
  Filled 2015-02-07: qty 1

## 2015-02-07 MED ORDER — ACETAMINOPHEN 650 MG RE SUPP: 650.0000 mg | Freq: Four times a day (QID) | RECTAL | Status: DC | PRN

## 2015-02-07 MED ORDER — HYDROMORPHONE HCL 1 MG/ML IJ SOLN
1.0000 mg | Freq: Once | INTRAMUSCULAR | Status: AC
  Administered 2015-02-07: 1 mg via INTRAVENOUS

## 2015-02-07 MED ORDER — ONDANSETRON HCL 4 MG/2ML IJ SOLN: 4.0000 mg | Freq: Four times a day (QID) | INTRAMUSCULAR | Status: DC | PRN

## 2015-02-07 NOTE — ED Provider Notes (Signed)
[facility] Emergency Department Provider Note   ____________________________________________  Time seen: 8 PM I have reviewed the triage vital signs and the triage nursing note.  HISTORY  Chief Complaint Post-op Problem   Historian Patient  HPI Trevor Torres is a 44 y.o. male who had right shoulder surgery this morning by Dr. Martha Clan to "clean up right biceps tendon rupture" and fix the rotator cuff, who was discharged home this afternoon and since being home experienced a sneeze followed by a severe right shoulder pain. He tried an extra dose of his pain medication, and is still in severe pain. He called the on-call number and was told to come to the ED for further treatment/evaluation. The pain is severe and located in the anterior shoulder. He is in a shoulder immobilizer.    Past Medical History  Diagnosis Date  . HTN (hypertension) 01/07/2015  . Sleep apnea   . Hypothyroidism   . Anxiety   . GERD (gastroesophageal reflux disease)   . Headache   . MRSA infection     h/o years ago  . Cellulitis 01-2015    left arm    Patient Active Problem List   Diagnosis Date Noted  . HTN (hypertension) 01/07/2015  . Vitamin B12 deficiency 01/03/2015  . 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 Major depressive disorder, recurrent, severe without psychotic behavior 01/02/2015  . Hypothyroidism 01/02/2015  . Opioid use disorder, mild, abuse 01/02/2015  . Rotator cuff injury 01/02/2015  . 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 Laparoscopic gastric sleeve resection    . Esophagogastroduodenoscopy    . Colonoscopy    . Shoulder arthroscopy with open rotator cuff repair Right 02/06/2015    Procedure: SHOULDER ARTHROSCOPY WITH OPEN ROTATOR CUFF REPAIR;  Surgeon: Juanell Fairly, MD;  Location: [facility] ORS;  Service: Orthopedics;  Laterality: Right;     Current Outpatient Rx  Name  Route  Sig  Dispense  Refill  . amLODipine (NORVASC) 10 MG tablet   Oral   Take 1 tablet (10 mg total) by mouth daily. Patient taking differently: Take 10 mg by mouth at bedtime.    30 tablet   0   . atomoxetine (STRATTERA) 40 MG capsule   Oral   Take 1 capsule (40 mg total) by mouth daily.   30 capsule   0   . cyanocobalamin (,VITAMIN B-12,) 1000 MCG/ML injection   Subcutaneous   Inject 1 mL (1,000 mcg total) into the skin daily.   1 mL   4   . hydrOXYzine (ATARAX/VISTARIL) 25 MG tablet   Oral   Take 1 tablet (25 mg total) by mouth 3 (three) times daily as needed for anxiety (insomnia or anxiety).   90 tablet   0   . levothyroxine (SYNTHROID, LEVOTHROID) 150 MCG tablet   Oral   Take 1 tablet (150 mcg total) by mouth daily before breakfast.   30 tablet   0   . oxyCODONE (OXY IR/ROXICODONE) 5 MG immediate release tablet   Oral   Take 1-2 tablets (5-10 mg total) by mouth every 4 (four) hours as needed for severe pain.   60 tablet   0   . promethazine (PHENERGAN) 12.5 MG tablet   Oral   Take 1 tablet (12.5 mg total) by mouth every 6 (six) hours as needed for nausea or vomiting.   15 tablet   0   .  sertraline (ZOLOFT) 50 MG tablet   Oral   Take 1 tablet (50 mg total) by mouth at bedtime.   30 tablet   0   . venlafaxine XR (EFFEXOR-XR) 37.5 MG 24 hr capsule   Oral   Take 1 capsule (37.5 mg total) by mouth daily with breakfast.   30 capsule   0     Allergies Nsaids  Family History  Problem Relation Age of Onset  . Diabetes Mother   . Hypertension Mother   . Cancer Mother   . Cancer Father     Social History Social History  Substance Use Topics  . Smoking status: Never Smoker   . Smokeless tobacco: None  . Alcohol Use: Yes     Comment: socially    Review of Systems  Constitutional: Negative for fever. Eyes: Negative for visual changes. ENT: Negative for sore throat. Cardiovascular: Negative for chest  pain. Respiratory: Negative for shortness of breath. Gastrointestinal: Negative for abdominal pain, vomiting and diarrhea. Genitourinary: Negative for dysuria. Musculoskeletal: Negative for back pain. Skin: Negative for rash. Neurological: Negative for headache. 10 point Review of Systems otherwise negative ____________________________________________   PHYSICAL EXAM:  VITAL SIGNS: ED Triage Vitals  Enc Vitals Group     BP 02/07/15 1926 151/85 mmHg     Pulse Rate 02/07/15 1926 83     Resp 02/07/15 1926 20     Temp 02/07/15 1926 98 F (36.7 C)     Temp Source 02/07/15 1926 Oral     SpO2 02/07/15 1926 95 %     Weight 02/07/15 1926 230 lb (104.327 kg)     Height 02/07/15 1926  (1.753 m)     Head Cir --      Peak Flow --      Pain Score 02/07/15 1928 9     Pain Loc --      Pain Edu? --      Excl. in GC? --      Constitutional: Alert and oriented. Well appearing overall, however in significant pain. Eyes: Conjunctivae are normal. PERRL. Normal extraocular movements. ENT   Head: Normocephalic and atraumatic.   Nose: No congestion/rhinnorhea.   Mouth/Throat: Mucous membranes are moist.   Neck: No stridor. Cardiovascular/Chest: Normal rate, regular rhythm.  No murmurs, rubs, or gallops. Respiratory: Normal respiratory effort without tachypnea nor retractions. Breath sounds are clear and equal bilaterally. No wheezes/rales/rhonchi. Gastrointestinal: Soft. No distention, no guarding, no rebound. Nontender   Genitourinary/rectal:Deferred Musculoskeletal: Wearing a shoulder immobilizer on the right. Tenderness over the anterior aspect of the right shoulder. Neurovascularly intact distally. Neurologic:  Normal speech and language. No gross or focal neurologic deficits are appreciated. Skin:  Skin is warm, dry and intact. No rash noted. Psychiatric: Mood and affect are normal. Speech and behavior are normal. Patient exhibits appropriate insight and  judgment.  ____________________________________________   EKG I, Governor Rooks, MD, the attending physician have personally viewed and interpreted all ECGs.  No EKG performed ____________________________________________  LABS (pertinent positives/negatives)  None  ____________________________________________  RADIOLOGY All Xrays were viewed by me. Imaging interpreted by Radiologist.  None __________________________________________  PROCEDURES  Procedure(s) performed: None  Critical Care performed: None  ____________________________________________   ED COURSE / ASSESSMENT AND PLAN  CONSULTATIONS: Phone consultation with Dr. Martha Clan. Dr. Martha Clan will admit for pain control overnight.  Pertinent labs & imaging results that were available during my care of the patient were reviewed by me and considered in my medical decision making (see chart for details).  Patient was given Dilaudid with Zofran for significant post surgical pain.  Patient remained still in significant pain, very tearful. IV was placed in a second dose of narcotic pain medication. I discussed the case with Dr. Martha Clan who will admit the patient under observation for pain control and see him in the morning in the hospital.   Patient / Family / Caregiver informed of clinical course, medical decision-making process, and agree with plan.    ___________________________________________   FINAL CLINICAL IMPRESSION(S) / ED DIAGNOSES   Final diagnoses:  Right shoulder pain       Governor Rooks, MD 02/07/15 2055

## 2015-02-07 NOTE — ED Notes (Signed)
Pt resting quietly on stretcher with eyes closed but easily aroused. Pt asking for something to drink. No distress noted at this time.

## 2015-02-07 NOTE — ED Notes (Signed)
Pt had surgery today at [facility] on right shoulder by dr Brooke Pace.  Pt has increased pain after sneezing this afternoon.  Pt has a brace/immobilizer on right shoulder.

## 2015-02-08 ENCOUNTER — Observation Stay: Payer: 59

## 2015-02-08 DIAGNOSIS — F332 Major depressive disorder, recurrent severe without psychotic features: Secondary | ICD-10-CM | POA: Diagnosis not present

## 2015-02-08 LAB — BASIC METABOLIC PANEL
ANION GAP: 4 — AB (ref 5–15)
BUN: 7 mg/dL (ref 6–20)
CHLORIDE: 106 mmol/L (ref 101–111)
CO2: 28 mmol/L (ref 22–32)
Calcium: 8.3 mg/dL — ABNORMAL LOW (ref 8.9–10.3)
Creatinine, Ser: 1.09 mg/dL (ref 0.61–1.24)
Glucose, Bld: 93 mg/dL (ref 65–99)
POTASSIUM: 3.9 mmol/L (ref 3.5–5.1)
SODIUM: 138 mmol/L (ref 135–145)

## 2015-02-08 LAB — CBC
HEMATOCRIT: 37.2 % — AB (ref 40.0–52.0)
HEMOGLOBIN: 12.6 g/dL — AB (ref 13.0–18.0)
MCH: 31.5 pg (ref 26.0–34.0)
MCHC: 33.9 g/dL (ref 32.0–36.0)
MCV: 92.9 fL (ref 80.0–100.0)
Platelets: 281 10*3/uL (ref 150–440)
RBC: 4 MIL/uL — AB (ref 4.40–5.90)
RDW: 14.6 % — AB (ref 11.5–14.5)
WBC: 7.5 10*3/uL (ref 3.8–10.6)

## 2015-02-08 MED ORDER — HYDROXYZINE HCL 50 MG PO TABS
50.0000 mg | ORAL_TABLET | Freq: Three times a day (TID) | ORAL | Status: DC
  Administered 2015-02-08 – 2015-02-09 (×3): 50 mg via ORAL
  Filled 2015-02-08 (×3): qty 1

## 2015-02-08 MED ORDER — BISACODYL 5 MG PO TBEC: 10.0000 mg | DELAYED_RELEASE_TABLET | Freq: Every day | ORAL | Status: DC | PRN

## 2015-02-08 MED ORDER — SERTRALINE HCL 100 MG PO TABS
100.0000 mg | ORAL_TABLET | Freq: Every day | ORAL | Status: DC
  Administered 2015-02-08: 100 mg via ORAL
  Filled 2015-02-08: qty 1

## 2015-02-08 MED ORDER — HYDROMORPHONE HCL 1 MG/ML IJ SOLN
1.0000 mg | INTRAMUSCULAR | Status: DC | PRN
  Administered 2015-02-08 (×2): 1 mg via INTRAVENOUS
  Filled 2015-02-08 (×2): qty 1

## 2015-02-08 MED ORDER — OXYCODONE HCL 5 MG PO TABS
10.0000 mg | ORAL_TABLET | ORAL | Status: DC | PRN
  Administered 2015-02-09 (×2): 10 mg via ORAL
  Filled 2015-02-08 (×2): qty 2

## 2015-02-08 MED ORDER — HYDROMORPHONE HCL 2 MG PO TABS
4.0000 mg | ORAL_TABLET | ORAL | Status: DC | PRN
  Administered 2015-02-08: 4 mg via ORAL
  Filled 2015-02-08: qty 2

## 2015-02-08 MED ORDER — MAGNESIUM HYDROXIDE 400 MG/5ML PO SUSP
30.0000 mL | Freq: Every day | ORAL | Status: DC | PRN
  Administered 2015-02-08: 30 mL via ORAL
  Filled 2015-02-08: qty 30

## 2015-02-08 NOTE — Progress Notes (Signed)
Pt requesting something for his bowels.  Dr Martha Clan gave orders for milk of mag and dulcolax

## 2015-02-08 NOTE — Progress Notes (Signed)
I just walked back into the patients room after talking to [facility] to verify the meds with the pt.  Pt was relaxed and messing with his phone.  After seeing me he started grimacing and moaning.  Order put in for hyroxazine and zoloft per Dr Samuel Germany order

## 2015-02-08 NOTE — Progress Notes (Signed)
Spoke with the RN, Alcario Drought, taking care of patient.  Patient in tears in his room, but won't explain why.  Dilaudid tablets reportedly are not improving his pain.  I am going to put him back on oxycodone and IV dilaudid.  Based on his current condition, he won't be able to get home tonight.  Will await Dr. Shary Key recommendations as patient is tearful and I don't believe this is just due to pain.  Will reassess tomorrow.

## 2015-02-08 NOTE — Care Management Note (Signed)
Case Management Note  Patient Details  Name: Trevor Torres MRN: [id number] Date of Birth: [birth year]  Subjective/Objective:                 Patient admitted with post op pain status post RIGHT SHOULDER ARTHROSCOPY .  Patient states that he lives at home with his mother.  Patient uses [facility] in [place] to obtain his medication.  Patient normally drives himself, but post up his mother plans to take him to any appointments.    Action/Plan: OT ordered.  RNCM to follow for discharge planning   Expected Discharge Date:                  Expected Discharge Plan:     In-House Referral:     Discharge planning Services     Post Acute Care Choice:    Choice offered to:     DME Arranged:    DME Agency:     HH Arranged:    HH Agency:     Status of Service:     Medicare Important Message Given:    Date Medicare IM Given:    Medicare IM give by:    Date Additional Medicare IM Given:    Additional Medicare Important Message give by:     If discussed at Long Length of Stay Meetings, dates discussed:    Additional Comments:  Chapman Fitch, RN 02/08/2015, 12:12 PM

## 2015-02-08 NOTE — Consult Note (Signed)
[facility] Face-to-Face Psychiatry Consult   Reason for Consult:  Consult for this 44 year old man with a history of recurrent major depression and multiple stresses. Patient was crying today raising concern about discharge planning. Referring Physician:  Mack Guise Patient Identification: Trevor Torres MRN:  [id number] Principal Diagnosis: Major depressive disorder, recurrent, severe without psychotic behavior Diagnosis:   Patient Active Problem List   Diagnosis Date Noted  . Shoulder pain [M25.519] 02/07/2015  . HTN (hypertension) [I10] 01/07/2015  . Vitamin B12 deficiency [E53.8] 01/03/2015  . 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 Major depressive disorder, recurrent, severe without psychotic behavior [F33.2] 01/02/2015  . Hypothyroidism [E03.9] 01/02/2015  . Opioid use disorder, mild, abuse [F11.10] 01/02/2015  . Rotator cuff injury [S46.009A] 01/02/2015  . ADH disorder [E22.2] 01/02/2015    Total Time spent with patient: 1 hour  Subjective:   Trevor Torres is a 44 y.o. male patient admitted with "of course my mood is up and down".  HPI:  Information from the patient and the chart. Reviewed old chart. I have interacted with this patient before on previous hospitalizations. Patient is currently in the hospital having reparative surgery on his shoulder after the injury he suffered in the spring time. He was noticed today to be crying and appeared to be emotionally upset. The patient says that his mood is still up and down and he still feels down and depressed much of the time. He sleeps poorly at night and feels nervous and worried a lot. His appetite is fine. He says that despite this he remains optimistic and hopeful and does not feel like things are completely hopeless for him. He is looking forward to recovering from his surgery and hopes to be able to recover to the point of being able to work  again. Despite this he is aware of continuing to have multiple stresses. His wife and he are now completely separated and he is living at his mother's house. He much does not want to be separated from her. He is of course not able to work and has been without finances for many months. Patient denies that he is continuing to drink. Says that he has cut that out and he is not overusing his narcotics. He denies that he's having any suicidal thoughts whatsoever. Denies that he's having any hallucinations. He is currently getting outpatient treatment by going to therapy twice a week and also was on antidepressive medicine.  Past psychiatric history: Patient has a history of recurrent episodes of major depression. He was in the hospital earlier in the summer with suicidal thoughts. There also was an incident of him taking excessive amounts of Tylenol although was not clear that it was an actual suicide attempt. He has been compliant with recommended psychiatric treatment going to therapy and has been on several antidepressives. Currently he says he is taking Zoloft. No history of mania.  Substance abuse history: Patient has had some episodes of drinking too much in the past but says that he has stopped that now. There was some concern at one point about overuse of opiates but he denies that that is an ongoing problem.  Social history: Out of work because of his injury. Separated from his wife. Living with his mother. Feels quite anxious and stressed. Finances are very limited.  Family history: No reported history of mental illness   HPI Elements:   Quality:  Depressed mood tearfulness fatigue. Severity:  Moderate  but not life-threatening currently. Timing:  Relapsing chronic illness especially bad for the last couple months. Duration:  Ongoing been there for months. Context:  Multiple major stresses as noted above.  Past Medical History:  Past Medical History  Diagnosis Date  . HTN (hypertension)  01/07/2015  . Sleep apnea   . Hypothyroidism   . Anxiety   . GERD (gastroesophageal reflux disease)   . Headache   . MRSA infection     h/o years ago  . Cellulitis 01-2015    left arm    Past Surgical History  Procedure Laterality Date  . Laparoscopic gastric sleeve resection    . Esophagogastroduodenoscopy    . Colonoscopy    . Shoulder arthroscopy with open rotator cuff repair Right 02/06/2015    Procedure: SHOULDER ARTHROSCOPY WITH OPEN ROTATOR CUFF REPAIR;  Surgeon: Thornton Park, MD;  Location: [facility] ORS;  Service: Orthopedics;  Laterality: Right;   Family History:  Family History  Problem Relation Age of Onset  . Diabetes Mother   . Hypertension Mother   . Cancer Mother   . Cancer Father    Social History:  History  Alcohol Use  . Yes    Comment: socially     History  Drug Use No    Social History   Social History  . Marital Status: Married    Spouse Name: N/A  . Number of Children: N/A  . Years of Education: N/A   Social History Main Topics  . Smoking status: Never Smoker   . Smokeless tobacco: None  . Alcohol Use: Yes     Comment: socially  . Drug Use: No  . Sexual Activity: Not Asked   Other Topics Concern  . None   Social History Narrative   Additional Social History:                          Allergies:   Allergies  Allergen Reactions  . Nsaids Other (See Comments)    Pt is unable to take due to gastric sleeve surgery.    Labs:  Results for orders placed or performed during the hospital encounter of 02/07/15 (from the past 48 hour(s))  Basic metabolic panel     Status: Abnormal   Collection Time: 02/08/15  4:48 AM  Result Value Ref Range   Sodium 138 135 - 145 mmol/L   Potassium 3.9 3.5 - 5.1 mmol/L   Chloride 106 101 - 111 mmol/L   CO2 28 22 - 32 mmol/L   Glucose, Bld 93 65 - 99 mg/dL   BUN 7 6 - 20 mg/dL   Creatinine, Ser 1.09 0.61 - 1.24 mg/dL   Calcium 8.3 (L) 8.9 - 10.3 mg/dL   GFR calc non Af Amer >60 >60 mL/min     GFR calc Af Amer >60 >60 mL/min    Comment: (NOTE) The eGFR has been calculated using the CKD EPI equation. This calculation has not been validated in all clinical situations. eGFR's persistently <60 mL/min signify possible Chronic Kidney Disease.    Anion gap 4 (L) 5 - 15  CBC     Status: Abnormal   Collection Time: 02/08/15  4:48 AM  Result Value Ref Range   WBC 7.5 3.8 - 10.6 K/uL   RBC 4.00 (L) 4.40 - 5.90 MIL/uL   Hemoglobin 12.6 (L) 13.0 - 18.0 g/dL   HCT 37.2 (L) 40.0 - 52.0 %   MCV 92.9 80.0 - 100.0 fL  MCH 31.5 26.0 - 34.0 pg   MCHC 33.9 32.0 - 36.0 g/dL   RDW 14.6 (H) 11.5 - 14.5 %   Platelets 281 150 - 440 K/uL    Vitals: Blood pressure 142/92, pulse 65, temperature 97.6 F (36.4 C), temperature source Oral, resp. rate 18, height $RemoveBe'5\' 9"'oQyiQkURN$  (1.753 m), weight 104.327 kg (230 lb), SpO2 100 %.  Risk to Self: Is patient at risk for suicide?: No Risk to Others:   Prior Inpatient Therapy:   Prior Outpatient Therapy:    Current Facility-Administered Medications  Medication Dose Route Frequency Provider Last Rate Last Dose  . 0.9 %  sodium chloride infusion   Intravenous Continuous Thornton Park, MD 75 mL/hr at 02/08/15 0042    . acetaminophen (TYLENOL) tablet 650 mg  650 mg Oral Q6H PRN Thornton Park, MD       Or  . acetaminophen (TYLENOL) suppository 650 mg  650 mg Rectal Q6H PRN Thornton Park, MD      . acetaminophen (TYLENOL) tablet 1,000 mg  1,000 mg Oral 4 times per day Thornton Park, MD   1,000 mg at 02/08/15 1705  . alum & mag hydroxide-simeth (MAALOX/MYLANTA) 200-200-20 MG/5ML suspension 30 mL  30 mL Oral Q4H PRN Thornton Park, MD      . bisacodyl (DULCOLAX) EC tablet 10 mg  10 mg Oral Daily PRN Thornton Park, MD      . diphenhydrAMINE (BENADRYL) 12.5 MG/5ML elixir 12.5-25 mg  12.5-25 mg Oral Q4H PRN Thornton Park, MD   12.5 mg at 02/08/15 0228  . docusate sodium (COLACE) capsule 100 mg  100 mg Oral BID Thornton Park, MD   100 mg at 02/08/15 1015  .  HYDROmorphone (DILAUDID) injection 1 mg  1 mg Intravenous Q2H PRN Thornton Park, MD   1 mg at 02/08/15 1733  . hydrOXYzine (ATARAX/VISTARIL) tablet 50 mg  50 mg Oral TID Thornton Park, MD   50 mg at 02/08/15 1502  . magnesium hydroxide (MILK OF MAGNESIA) suspension 30 mL  30 mL Oral Daily PRN Thornton Park, MD   30 mL at 02/08/15 1733  . metoCLOPramide (REGLAN) tablet 5-10 mg  5-10 mg Oral Q8H PRN Thornton Park, MD       Or  . metoCLOPramide (REGLAN) injection 5-10 mg  5-10 mg Intravenous Q8H PRN Thornton Park, MD   10 mg at 02/08/15 1741  . ondansetron (ZOFRAN) tablet 4 mg  4 mg Oral Q6H PRN Thornton Park, MD   4 mg at 02/08/15 1411   Or  . ondansetron [facility]) injection 4 mg  4 mg Intravenous Q6H PRN Thornton Park, MD      . oxyCODONE (Oxy IR/ROXICODONE) immediate release tablet 10 mg  10 mg Oral Q3H PRN Thornton Park, MD      . sertraline (ZOLOFT) tablet 100 mg  100 mg Oral QHS Thornton Park, MD        Musculoskeletal: Strength & Muscle Tone: decreased Gait & Station: Did not ask him to stand Patient leans: N/A  Psychiatric Specialty Exam: Physical Exam  Nursing note and vitals reviewed. Constitutional: He appears well-developed and well-nourished.  HENT:  Head: Normocephalic and atraumatic.  Eyes: Conjunctivae are normal. Pupils are equal, round, and reactive to light.  Neck: Normal range of motion.  Cardiovascular: Normal heart sounds.   Respiratory: Effort normal.  GI: Soft.  Musculoskeletal: Normal range of motion.       Arms: Neurological: He is alert.  Skin: Skin is warm and dry.  Psychiatric: Judgment and  thought content normal. His speech is delayed. He is slowed. Cognition and memory are normal. He exhibits a depressed mood.    Review of Systems  Constitutional: Negative.   HENT: Negative.   Eyes: Negative.   Respiratory: Negative.   Cardiovascular: Negative.   Gastrointestinal: Negative.   Skin: Negative.   Neurological: Negative.     Psychiatric/Behavioral: Positive for depression. Negative for suicidal ideas, hallucinations, memory loss and substance abuse. The patient is nervous/anxious and has insomnia.     Blood pressure 142/92, pulse 65, temperature 97.6 F (36.4 C), temperature source Oral, resp. rate 18, height $RemoveBe'5\' 9"'rMIQMFnCA$  (1.753 m), weight 104.327 kg (230 lb), SpO2 100 %.Body mass index is 33.95 kg/(m^2).  General Appearance: Fairly Groomed  Engineer, water::  Minimal  Speech:  Slow  Volume:  Decreased  Mood:  Depressed  Affect:  Depressed  Thought Process:  Goal Directed  Orientation:  Full (Time, Place, and Person)  Thought Content:  Negative  Suicidal Thoughts:  No  Homicidal Thoughts:  No  Memory:  Immediate;   Good Recent;   Fair Remote;   Fair  Judgement:  Intact  Insight:  Fair  Psychomotor Activity:  Decreased  Concentration:  Fair  Recall:  [employer] of Knowledge:Good  Language: Good  Akathisia:  No  Handed:  Right  AIMS (if indicated):     Assets:  Communication Skills Desire for Improvement Housing Resilience Social Support  ADL's:  Intact  Cognition: WNL  Sleep:      Medical Decision Making: Established Problem, Stable/Improving (1), Review of Psycho-Social Stressors (1), Review or order clinical lab tests (1) and Review of Medication Regimen & Side Effects (2)  Treatment Plan Summary: Medication management and Plan Patient continues to have multiple symptoms of major depression but does not appear to be currently an acute danger to himself. Does not have suicidal thoughts. Has articulated multiple positive plans for the future. No evidence of psychosis. No current concern evident about substance abuse. Patient does not require psychiatric hospitalization. I encouraged him very much to continue with his outpatient treatment both therapy and medication. Symptoms of depression reviewed. I will follow-up as needed but he does not need to stay in the hospital further for psychiatric treatment. No  change to medicine.  Plan:  No evidence of imminent risk to self or others at present.   Patient does not meet criteria for psychiatric inpatient admission. Supportive therapy provided about ongoing stressors. Discussed crisis plan, support from social network, calling 911, coming to the Emergency Department, and calling Suicide Hotline. Disposition: Supportive counseling. Reviewed medicine. No change to medicine. Patient is to continue to get outpatient psychiatric treatment  Alethia Berthold 02/08/2015 6:50 PM

## 2015-02-08 NOTE — H&P (Signed)
PREOPERATIVE H&P  Chief Complaint: Right shoulder pain s/p mini-open rotator cuff repair on 02/06/15  HPI: Trevor Torres is a 44 y.o. male who is postop day #3 status post right mini open rotator cuff repair. He was admitted last night for pain control. He presented to the ER after experiencing severe pain in the right shoulder.  The pain began when he went to get out of a chair. He felt a pop in the right shoulder and developed intense pain. His pain was not controlled with 15 mg of oxycodone at home. He was therefore instructed to come to the emergency room. I was called by the ER staff and admitted the patient last night.   He is seen in his hospital room this morning with his mother at the bedside. Patient's nurses at the bedside during my visit. The patient is complaining of severe pain in the right shoulder. He has been receiving IV Dilaudid and oxycodone for pain strong. He states that the medications are not giving him the type of relief he expects. Patient appears drowsy and often speaks without opening his eyes. There are times he seems to be dozing off.    Past Medical History  Diagnosis Date  . HTN (hypertension) 01/07/2015  . Sleep apnea   . Hypothyroidism   . Anxiety   . GERD (gastroesophageal reflux disease)   . Headache   . MRSA infection     h/o years ago  . Cellulitis 01-2015    left arm   Past Surgical History  Procedure Laterality Date  . Laparoscopic gastric sleeve resection    . Esophagogastroduodenoscopy    . Colonoscopy    . Shoulder arthroscopy with open rotator cuff repair Right 02/06/2015    Procedure: SHOULDER ARTHROSCOPY WITH OPEN ROTATOR CUFF REPAIR;  Surgeon: Juanell Fairly, MD;  Location: [facility] ORS;  Service: Orthopedics;  Laterality: Right;   Social History   Social History  . Marital Status: Married    Spouse Name: N/A  . Number of Children: N/A  . Years of Education: N/A   Social History Main Topics  . Smoking status: Never Smoker   . Smokeless  tobacco: None  . Alcohol Use: Yes     Comment: socially  . Drug Use: No  . Sexual Activity: Not Asked   Other Topics Concern  . None   Social History Narrative   Family History  Problem Relation Age of Onset  . Diabetes Mother   . Hypertension Mother   . Cancer Mother   . Cancer Father    Allergies  Allergen Reactions  . Nsaids Other (See Comments)    Pt is unable to take due to gastric sleeve surgery.   Prior to Admission medications   Medication Sig Start Date End Date Taking? Authorizing Provider  amLODipine (NORVASC) 10 MG tablet Take 1 tablet (10 mg total) by mouth daily. 01/07/15  Yes Jimmy Footman, MD  atomoxetine (STRATTERA) 40 MG capsule Take 1 capsule (40 mg total) by mouth daily. 01/07/15  Yes Jimmy Footman, MD  cyanocobalamin (,VITAMIN B-12,) 1000 MCG/ML injection Inject 1 mL (1,000 mcg total) into the skin daily. Patient taking differently: Inject 1,000 mcg into the skin every 30 (thirty) days.  01/07/15  Yes Jimmy Footman, MD  hydrOXYzine (ATARAX/VISTARIL) 50 MG tablet Take 50 mg by mouth 3 (three) times daily as needed for anxiety (or insomnia).   Yes Historical Provider, MD  levothyroxine (SYNTHROID, LEVOTHROID) 150 MCG tablet Take 1 tablet (150 mcg total) by mouth  daily before breakfast. 01/07/15  Yes Jimmy Footman, MD  oxyCODONE (OXY IR/ROXICODONE) 5 MG immediate release tablet Take 1-2 tablets (5-10 mg total) by mouth every 4 (four) hours as needed for severe pain. 02/06/15  Yes Juanell Fairly, MD  promethazine (PHENERGAN) 12.5 MG tablet Take 1 tablet (12.5 mg total) by mouth every 6 (six) hours as needed for nausea or vomiting. 01/22/15  Yes Minna Antis, MD  sertraline (ZOLOFT) 50 MG tablet Take 1 tablet (50 mg total) by mouth at bedtime. 01/07/15  Yes Jimmy Footman, MD  traZODone (DESYREL) 100 MG tablet Take 100 mg by mouth at bedtime as needed for sleep.   Yes Historical Provider, MD  hydrOXYzine  (ATARAX/VISTARIL) 25 MG tablet Take 1 tablet (25 mg total) by mouth 3 (three) times daily as needed for anxiety (insomnia or anxiety). Patient not taking: Reported on 02/07/2015 01/07/15   Jimmy Footman, MD  venlafaxine XR (EFFEXOR-XR) 37.5 MG 24 hr capsule Take 1 capsule (37.5 mg total) by mouth daily with breakfast. Patient not taking: Reported on 02/07/2015 01/07/15   Jimmy Footman, MD     Positive ROS: All other systems have been reviewed and were otherwise negative with the exception of those mentioned in the HPI and as above.  Physical Exam: General: Drowsy but arousable no acute distress Skin: Skin intact, no lesions within the operative field. Neurologic: Sensation intact distally Psychiatric: Patient has altered mood and affect  MUSCULOSKELETAL: Right shoulder: Patient's dressing is in place. There is no erythema or ecchymosis. He has intact sensation to light touch. Any passive or active movement of the shoulder causes significant pain. His abduction sling was in place but needed to be adjusted. He has a Polar Care sleeve in place as well. A TENS unit was placed during my examination. He has palpable pedal pulses. His finger is well-perfused and he has intact motor function distally.  Assessment: Right shoulder pain s/p right shoulder mini open rotator cuff repair  Plan:  The patient is having significant pain postoperatively. His pain level seems more than expected. I reviewed x-rays of his right shoulder which show one of the suture anchors appears to have pulled out of the bone. It is still underneath the rotator cuff however. There are 5 other anchors that are well positioned and I do not suspect his repair has been compromised. This however may explain why he felt a pop in his right shoulder.  Postoperative pain seems to be the main issue for him. He has had issues with postoperative pain following a bariatric surgery as well. Patient began to cry during  adjustments of his abduction sling. Even the nurse removing tape from around his IV caused him to cry out.  Patient seen sedated. He does not open his eyes to talk to me. He will open his eyes when requested however. Patient's affect seems altered. He states that he stopped his anxiolytic medication, he is still taking his anti-depressive. He does see a therapist outside the hospital. Patient also has a history of scratching/picking his own skin causing scabs. Evidence of this is still on his left forearm. He was due for an appointment with his therapist today but missed that as he was here in the hospital. I believe the patient's anxiety and depression are likely playing a significant role in his issues postoperatively. He seems overwhelmed by the postoperative pain, the immobilization of his right shoulder. He also admitted to the nurse he was having issues at home and has recently separated from his  wife. I'm going to order a psychiatry consult for the patient with Dr. Toni Amend  I'm going to change his oral pain medication to Dilaudid 2-4 mg every 3 hours when necessary for pain. I'm going to discontinue use the IV Dilantin given his apparent sedation. I'm discontinuing the oxycodone. Patient may also take Tylenol. He is not a candidate for NSAIDs as he has had bariatric surgery in the past and has been instructed to avoid NSAIDs. His Polar Care unit and sling were readjusted and a TENS unit was applied to assist with pain control. I'm ordering occupational therapy to assist him with ADLs.  Patient will be discharged home when his pain is controlled and he passes occupational therapy. I'm hopeful psychiatry considering the patient prior to discharge. Patient and his mother understood and agreed with this plan.   Juanell Fairly, MD   02/08/2015 12:49 PM

## 2015-02-08 NOTE — Progress Notes (Signed)
I walked into the room to find patient sitting on the side of the bed and crying.  He did not want to talk about it but I asked if it was about his wife and he said yes.  I let him know he could talk to me if he wanted.

## 2015-02-08 NOTE — Evaluation (Signed)
Occupational Therapy Evaluation Patient Details Name: Trevor Torres MRN: [id number] DOB: Sep 20, 1970 Today's Date: 02/08/2015    History of Present Illness 44 year old male who had a rotator cuff repair and went  home and returned with severe pain.   Clinical Impression   This patient is a 44 year old male who had right shoulder surgery on the 22 nd  by Dr. Martha Clan to "clean up right biceps tendon rupture" and fix the rotator cuff,  He was discharged home this  and since being home experienced a sneeze (as per chart)  followed by a severe right shoulder pain.  He called the on-call number and was told to come to the ED for further treatment/evaluation. The pain is severe and located in the anterior shoulder. He is in a shoulder immobilizer.He presently lives with his mom and until surgery had been independent, but does say he gets some help with activities of daily living. Patient very groggy and doses off several times during the session. He would benefit from out patient therapy when appropriate.    Follow Up Recommendations   (out patient therapy)    Equipment Recommendations       Recommendations for Other Services       Precautions / Restrictions Precautions Precautions: Shoulder Shoulder Interventions: Shoulder sling/immobilizer;At all times;Off for dressing/bathing/exercises Restrictions Weight Bearing Restrictions: Yes RUE Weight Bearing: Non weight bearing Other Position/Activity Restrictions: R UE      Mobility Bed Mobility                  Transfers                      Balance                                            ADL                                         General ADL Comments: Before surgery independent after reports getting assist for some ADL (patient very groggy during his report)     Vision     Perception     Praxis      Pertinent Vitals/Pain       Hand Dominance      Extremity/Trunk Assessment Upper Extremity Assessment Upper Extremity Assessment:  (L UE WFL R UE not tested)           Communication     Cognition Arousal/Alertness: Lethargic;Suspect due to medications (Patient doses off during evaluation) Behavior During Therapy: Anxious;Restless                       General Comments       Exercises  No exercises were done, but patient instructed to move wrist and fingers but not elbow or shoulder. (once again, patient very groggy and was difficult for him to participate).     Shoulder Instructions      Home Living Family/patient expects to be discharged to:: Private residence Living Arrangements: Parent     Home Access: Stairs to enter Entrance Stairs-Number of Steps: 1   Home Layout: One level  Prior Functioning/Environment               OT Diagnosis: Acute pain   OT Problem List:     OT Treatment/Interventions:      OT Goals(Current goals can be found in the care plan section) Acute Rehab OT Goals Patient Stated Goal: To feel better OT Goal Formulation: With patient Time For Goal Achievement: 02/22/15 Potential to Achieve Goals: Good  OT Frequency:     Barriers to D/C:            Co-evaluation              End of Session    Activity Tolerance:   Patient left: in bed;with call bell/phone within reach;with bed alarm set   Time: 1025-1040 OT Time Calculation (min): 15 min Charges:  OT General Charges $OT Visit: 1 Procedure OT Evaluation $Initial OT Evaluation Tier I: 1 Procedure G-Codes:    Gwyndolyn Kaufman, MS/OTR/L  02/08/2015, 10:54 AM

## 2015-02-08 NOTE — Progress Notes (Signed)
OT Cancellation Note  Patient Details Name: Trevor Torres MRN: [id number] DOB: 1970-07-21   Cancelled Treatment:    Reason Eval/Treat Not Completed: Patient declined, no reason specified. Encouraged patient to get out of bed. Patient declined Occupational Therapy and getting out of bed. Patient crying and a vomited a small amount. RN notified.   Ocie Cornfield 02/08/2015, 2:07 PM

## 2015-02-09 NOTE — Progress Notes (Signed)
Patient being discharged to home. IV removed, discharge instructions given. Joni Reining called patient's mother to pick him up.  No Rx given; was given on 02/06/15 discharge.

## 2015-02-09 NOTE — Evaluation (Signed)
Physical Therapy Evaluation Patient Details Name: Trevor Torres MRN: [id number] DOB: Sep 15, 1970 Today's Date: 02/09/2015   History of Present Illness  44 year old male who had a rotator cuff repair and went  home and returned with severe pain.  Clinical Impression  Pt with improved pain control and is able to get in/out of bed safely and without increased in pain.  Pt ambulating without device for 225' with R shoulder sling and supporting R arm using L hand for comfort.  Reviewed donning/doffing button up shirt maintaining shoulder precautions.  Pt ready for d/c home, no further PT indicated.      Follow Up Recommendations No PT follow up    Equipment Recommendations       Recommendations for Other Services       Precautions / Restrictions Precautions Precautions: Shoulder Shoulder Interventions: Shoulder sling/immobilizer;At all times;Off for dressing/bathing/exercises Restrictions Weight Bearing Restrictions: Yes RUE Weight Bearing: Non weight bearing Other Position/Activity Restrictions: R UE      Mobility  Bed Mobility Overal bed mobility: Independent                Transfers Overall transfer level: Independent                  Ambulation/Gait Ambulation/Gait assistance: Independent Ambulation Distance (Feet): 225 Feet Assistive device: None Gait Pattern/deviations:  (slight stagger, no overt LOB )     General Gait Details: Steady cadence in hallway holding R arm with L hand for comfort.  Slight stagger with widened BOS but no LOB and good safety awareness.  Stairs            Wheelchair Mobility    Modified Rankin (Stroke Patients Only)       Balance Overall balance assessment: No apparent balance deficits (not formally assessed)                                           Pertinent Vitals/Pain Pain Assessment:  (controlled)    Home Living Family/patient expects to be discharged to:: Private residence Living  Arrangements: Parent   Type of Home: House Home Access: Stairs to enter   Secretary/administrator of Steps: 1 (curb) Home Layout: One level        Prior Function Level of Independence: Independent               Hand Dominance        Extremity/Trunk Assessment   Upper Extremity Assessment: Defer to OT evaluation           Lower Extremity Assessment: Overall WFL for tasks assessed         Communication   Communication: No difficulties  Cognition Arousal/Alertness: Awake/alert Behavior During Therapy: WFL for tasks assessed/performed                        General Comments      Exercises Donning/doffing shirt without moving shoulder:  (verbal instructions to use button up shirt and keep R arm supported in neutral position wihout elevation.)      Assessment/Plan    PT Assessment Patent does not need any further PT services  PT Diagnosis Acute pain   PT Problem List    PT Treatment Interventions     PT Goals (Current goals can be found in the Care Plan section) Acute Rehab PT Goals Patient Stated Goal:  None stated    Frequency     Barriers to discharge        Co-evaluation               End of Session   Activity Tolerance: Patient tolerated treatment well;No increased pain Patient left: in bed;with call bell/phone within reach           Time: 1245-1305 PT Time Calculation (min) (ACUTE ONLY): 20 min   Charges:   PT Evaluation $Initial PT Evaluation Tier I: 1 Procedure     PT G Codes:        Gisele A Long 02-16-2015, 1:13 PM

## 2015-02-09 NOTE — Progress Notes (Signed)
Subjective:  POD # 3 s/p mini-open rotatory cuff repair. Patient reports pain as marked.  Patient is found lying in bed with his right arm out of his abduction sling. The sling has been completely taken apart, his Polar Care sleeve's office shoulder and the TENS unit has been disconnected. The patient admits that he took everything off the right shoulder.  The shoulder is in an abducted position while he lies in bed. Patient states that his pain is marked and initially he appears drowsy but doesn't open his eyes while talking. I got the patient's nurse to help me sit him on the side of the bed, at which point he became more alert and cooperative. I fixed the patient's abduction sling and Polar Care on his right shoulder.   Objective:   VITALS:   Filed Vitals:   02/08/15 1523 02/08/15 2135 02/09/15 0509 02/09/15 0811  BP: 142/92 137/92 130/98 134/108  Pulse: 65 66 74 70  Temp: 97.6 F (36.4 C) 98.4 F (36.9 C) 98.2 F (36.8 C) 98.3 F (36.8 C)  TempSrc: Oral Oral Oral Oral  Resp: Height:      Weight:      SpO2: 100% 100% 100% 95%    PHYSICAL EXAM:  Right shoulder: Patient's dressing remained clean dry and intact. His intact sensation throughout the right upper extremity. Patient can flex and extend his fingers on the right hand and he has a palpable radial pulse. His abduction sling was placed back on his right arm, repositioned and secured.   LABS  No results found for this or any previous visit (from the past 24 hour(s)).  Dg Shoulder Right Port  02/08/2015   CLINICAL DATA:  Right shoulder surgery today. Back tonight due to increased pain. Unable to move shoulder. Brace is in place.  EXAM: PORTABLE RIGHT SHOULDER - 2+ VIEW  COMPARISON:  09/06/2014  FINDINGS: Postoperative changes in the right shoulder with multiple surgical anchors in place along the lateral aspect of the right humeral head. This is probably from rotator cuff repair. Degenerative changes in the  acromioclavicular joint. Soft tissue gas is likely related to recent surgery. No evidence of acute fracture or dislocation in the right shoulder. Visualized right ribs appear intact. No bone destruction identified.  IMPRESSION: Postoperative changes in the right shoulder. Degenerative changes in the acromioclavicular joint. No acute displaced fractures identified.   Electronically Signed   By: Burman Nieves M.D.   On: 02/08/2015 00:59    Assessment/Plan:     Principal Problem:   Major depressive disorder, recurrent, severe without psychotic behavior Active Problems:   Shoulder pain  Patient's noncompliance is very concerning. I concerned that he is going to disrupt the repair. I explained to the patient that he needs to buy in to the postoperative program including wearing his right shoulder abduction sling at all times. He is not to loosen it or remove it. I informed him that he must wear it to sleep.  Patient may use the Polar Care on a when necessary basis. He did not feel the TENS unit was helping him and it was discontinued. He will take the unit home however as he may need to return to this eventually.  Going to order physical therapy to assist the patient getting out of bed and into a chair. He does not need shoulder exercises at this point. He will be discharged today if his pain is controlled and he passes physical therapy. He'll follow-up in  my office at the end of the week. Psychiatry has seen the patient yesterday. I appreciate Dr. Toni Amend seeing this patient. No adjustments were made to his medications. He will continue on oxycodone for pain control.    Juanell Fairly , MD 02/09/2015, 12:08 PM

## 2015-02-09 NOTE — Discharge Summary (Signed)
Physician Discharge Summary  Patient ID: Trevor Torres MRN: [id number] DOB/AGE: 1970/06/04 44 y.o.  Admit date: 02/07/2015 Discharge date: 02/09/2015  Admission Diagnoses:  Right shoulder pain following mini open rotator cuff repair Major depressive disorder, recurrent, severe without psychotic behavior  Discharge Diagnoses:  Right shoulder pain following mini open rotator cuff repair Principal Problem:   Major depressive disorder, recurrent, severe without psychotic behavior Active Problems:   Shoulder pain   Past Medical History  Diagnosis Date  . HTN (hypertension) 01/07/2015  . Sleep apnea   . Hypothyroidism   . Anxiety   . GERD (gastroesophageal reflux disease)   . Headache   . MRSA infection     h/o years ago  . Cellulitis 01-2015    left arm    Surgeries:  Right shoulder mini open rotator cuff repair on 02/06/15   Consultants (if any): Treatment Team:  Audery Amel, MD  Discharged Condition: Improved  Hospital Course: Trevor Torres is an 44 y.o. male who was admitted 02/07/2015 with a diagnosis of right shoulder pain following rotator cuff repair on 02/06/2015. Patient was discharged following surgery but he could not control the pain at home and was admitted upon his return to the ER.  She was admitted to orthopedic surgery service. Patient has a history of depression and anxiety. Patient had stopped his anxiolytics medication following surgery. Psychiatry was consulted to see the patient during his hospitalization. No adjustments were needed to his medications. Patient was evaluated by physical and occupational therapy as an inpatient.  He was given perioperative antibiotics:  Anti-infectives    Start     Dose/Rate Route Frequency Ordered Stop   02/07/15 2345  ceFAZolin (ANCEF) IVPB 1 g/50 mL premix     1 g 100 mL/hr over 30 Minutes Intravenous Every 6 hours 02/07/15 2341 02/08/15 1442    .  He was given sequential compression devices, early ambulation, and  TED stockings for DVT prophylaxis.  He benefited maximally from the hospital stay and there were no complications.    Recent vital signs:  Filed Vitals:   02/09/15 0811  BP: 134/108  Pulse: 70  Temp: 98.3 F (36.8 C)  Resp: 17    Recent laboratory studies:  Lab Results  Component Value Date   HGB 12.6* 02/08/2015   HGB 12.9* 01/22/2015   HGB 16.5 12/31/2014   Lab Results  Component Value Date   WBC 7.5 02/08/2015   PLT 281 02/08/2015   Lab Results  Component Value Date   INR 1.11 01/29/2015   Lab Results  Component Value Date   NA 138 02/08/2015   K 3.9 02/08/2015   CL 106 02/08/2015   CO2 28 02/08/2015   BUN 7 02/08/2015   CREATININE 1.09 02/08/2015   GLUCOSE 93 02/08/2015    Discharge Medications:     Medication List    ASK your doctor about these medications        amLODipine 10 MG tablet  Commonly known as:  NORVASC  Take 1 tablet (10 mg total) by mouth daily.     atomoxetine 40 MG capsule  Commonly known as:  STRATTERA  Take 1 capsule (40 mg total) by mouth daily.     cyanocobalamin 1000 MCG/ML injection  Commonly known as:  (VITAMIN B-12)  Inject 1 mL (1,000 mcg total) into the skin daily.     hydrOXYzine 50 MG tablet  Commonly known as:  ATARAX/VISTARIL  Take 50 mg by mouth 3 (three) times daily  as needed for anxiety (or insomnia).     hydrOXYzine 25 MG tablet  Commonly known as:  ATARAX/VISTARIL  Take 1 tablet (25 mg total) by mouth 3 (three) times daily as needed for anxiety (insomnia or anxiety).     levothyroxine 150 MCG tablet  Commonly known as:  SYNTHROID, LEVOTHROID  Take 1 tablet (150 mcg total) by mouth daily before breakfast.     oxyCODONE 5 MG immediate release tablet  Commonly known as:  Oxy IR/ROXICODONE  Take 1-2 tablets (5-10 mg total) by mouth every 4 (four) hours as needed for severe pain.     promethazine 12.5 MG tablet  Commonly known as:  PHENERGAN  Take 1 tablet (12.5 mg total) by mouth every 6 (six) hours as  needed for nausea or vomiting.     sertraline 50 MG tablet  Commonly known as:  ZOLOFT  Take 1 tablet (50 mg total) by mouth at bedtime.     traZODone 100 MG tablet  Commonly known as:  DESYREL  Take 100 mg by mouth at bedtime as needed for sleep.     venlafaxine XR 37.5 MG 24 hr capsule  Commonly known as:  EFFEXOR-XR  Take 1 capsule (37.5 mg total) by mouth daily with breakfast.        Diagnostic Studies: Dg Shoulder Right Port  02/20/15   CLINICAL DATA:  Right shoulder surgery today. Back tonight due to increased pain. Unable to move shoulder. Brace is in place.  EXAM: PORTABLE RIGHT SHOULDER - 2+ VIEW  COMPARISON:  09/06/2014  FINDINGS: Postoperative changes in the right shoulder with multiple surgical anchors in place along the lateral aspect of the right humeral head. This is probably from rotator cuff repair. Degenerative changes in the acromioclavicular joint. Soft tissue gas is likely related to recent surgery. No evidence of acute fracture or dislocation in the right shoulder. Visualized right ribs appear intact. No bone destruction identified.  IMPRESSION: Postoperative changes in the right shoulder. Degenerative changes in the acromioclavicular joint. No acute displaced fractures identified.   Electronically Signed   By: Burman Nieves M.D.   On: 02-20-2015 00:59    Disposition: 01-Home or Self Care  Wear sling at all times, including sleep.  The sling will need to be used for a total of 4 weeks following surgery.  Patient may be most comfortable sleeping in a recliner.  If patient sleeps in a bed, place pillows behind the right shoulder to support it.  Do not try and lift your arm away from your body for any reason.   Keep the dressing dry.  Leave the bandage in place until you follow-up in the office this week.   Sponge bathe until your first office appointment when the dressing is removed..    Home health social work will be ordered to help the patient get settled  in at home postop.  Patient has a follow-up appointment for the end of next week. He already was given a prescription on 02/06/2015 following his surgery for oxycodone 5 mg tablets with instructions to take 2 tablets every 3 hours when necessary for pain. He was given a total of 60 tablets. No additional narcotic prescriptions were given. He may take over-the-counter Tylenol as well for additional pain control.   Signed: Juanell Fairly ,MD 02/09/2015, 12:18 PM

## 2015-02-09 NOTE — Discharge Instructions (Signed)
Wear sling at all times, including sleep.  The sling will need to be used for a total of 4 weeks following surgery.  Patient may be most comfortable sleeping in a recliner.  If patient sleeps in a bed, place pillows behind the right shoulder to support it.  Do not try and lift your arm away from your body for any reason.   Keep the dressing dry.  Leave the bandage in place until you follow-up in the office this week.   Sponge bathe until your first office appointment when the dressing is removed..    Home health social work will be ordered to help the patient get settled in at home postop.  Patient has a follow-up appointment for the end of next week. He already was given a prescription on 02/06/2015 following his surgery for oxycodone 5 mg tablets with instructions to take 2 tablets every 3 hours when necessary for pain. He was given a total of 60 tablets. No additional narcotic prescriptions were given. He may take over-the-counter Tylenol as well for additional pain control.

## 2015-02-11 NOTE — Progress Notes (Signed)
   02/09/15 1312  PT - End of Session  Activity Tolerance Patient tolerated treatment well;No increased pain  Patient left in bed;with call bell/phone within reach  PT Assessment  PT Therapy Diagnosis  Acute pain  PT Recommendation/Assessment Patent does not need any further PT services  No Skilled PT All education completed;Patient is independent with all acitivity/mobility  PT Recommendation  Follow Up Recommendations No PT follow up  Acute Rehab PT Goals  Patient Stated Goal None stated  PT Time Calculation  PT Start Time (ACUTE ONLY) 1245  PT Stop Time (ACUTE ONLY) 1305  PT Time Calculation (min) (ACUTE ONLY) 20 min  PT G-Codes **NOT FOR INPATIENT CLASS**  Functional Assessment Tool Used clinical judgment  Functional Limitation Mobility: Walking and moving around  Mobility: Walking and Moving Around Current Status (Z6109) CI  Mobility: Walking and Moving Around Goal Status (U0454) CI  PT General Charges  $$ ACUTE PT VISIT 1 Procedure  PT Evaluation  $Initial PT Evaluation Tier I 1 Procedure   Late entry for missed G-code. Based on review of the evaluation and goals by Oletta Lamas, PT.    Mardelle Matte PT, DPT 02/11/2015 8:56 AM

## 2017-06-22 ENCOUNTER — Emergency Department
Admission: EM | Admit: 2017-06-22 | Discharge: 2017-06-22 | Disposition: A | Discharge status: Home / Self Care | Payer: 59 | Attending: Emergency Medicine | Admitting: Emergency Medicine

## 2017-06-22 ENCOUNTER — Encounter: Payer: Self-pay | Admitting: Emergency Medicine

## 2017-06-22 DIAGNOSIS — R234 Changes in skin texture: Secondary | ICD-10-CM

## 2017-06-22 DIAGNOSIS — L03011 Cellulitis of right finger: Secondary | ICD-10-CM | POA: Insufficient documentation

## 2017-06-22 DIAGNOSIS — Z79899 Other long term (current) drug therapy: Secondary | ICD-10-CM | POA: Insufficient documentation

## 2017-06-22 DIAGNOSIS — E039 Hypothyroidism, unspecified: Secondary | ICD-10-CM | POA: Insufficient documentation

## 2017-06-22 DIAGNOSIS — I10 Essential (primary) hypertension: Secondary | ICD-10-CM | POA: Insufficient documentation

## 2017-06-22 LAB — BASIC METABOLIC PANEL
Anion gap: 12 (ref 5–15)
BUN: 19 mg/dL (ref 6–20)
CO2: 21 mmol/L — ABNORMAL LOW (ref 22–32)
CREATININE: 0.96 mg/dL (ref 0.61–1.24)
Calcium: 9.7 mg/dL (ref 8.9–10.3)
Chloride: 104 mmol/L (ref 101–111)
GFR calc Af Amer: >60 mL/min (ref >60)
GFR calc non Af Amer: >60 mL/min (ref >60)
GLUCOSE: 100 mg/dL — AB (ref 65–99)
POTASSIUM: 4.3 mmol/L (ref 3.5–5.1)
SODIUM: 137 mmol/L (ref 135–145)

## 2017-06-22 LAB — CBC
HCT: 40.4 % (ref 40.0–52.0)
Hemoglobin: 14.2 g/dL (ref 13.0–18.0)
MCH: 34 pg (ref 26.0–34.0)
MCHC: 35.1 g/dL (ref 32.0–36.0)
MCV: 96.9 fL (ref 80.0–100.0)
PLATELETS: 309 10*3/uL (ref 150–440)
RBC: 4.17 MIL/uL — AB (ref 4.40–5.90)
RDW: 14.1 % (ref 11.5–14.5)
WBC: 5.9 10*3/uL (ref 3.8–10.6)

## 2017-06-22 MED ORDER — SULFAMETHOXAZOLE-TRIMETHOPRIM 800-160 MG PO TABS: 1.0000 | ORAL_TABLET | Freq: Two times a day (BID) | ORAL | 0 refills | Status: DC

## 2017-06-22 MED ORDER — TRAMADOL HCL 50 MG PO TABS: 50.0000 mg | ORAL_TABLET | Freq: Four times a day (QID) | ORAL | 0 refills | Status: DC | PRN

## 2017-06-22 NOTE — ED Triage Notes (Signed)
Pt reports that he had a cut to his R hand and that the swelling has gotten progressively worse over past 2 days.  Right pointer finger swollen and red at this time.  Pt denies injury.  Pt states that he has had a similar issue in the past.  Pt is A&Ox4, in NAD.

## 2017-06-22 NOTE — ED Notes (Signed)
First nurse note   Presents with pain and swelling to right hand  Swelling mainly to index finger  W/o injury

## 2017-06-22 NOTE — ED Provider Notes (Signed)
[facility] Emergency Department Provider Note  ____________________________________________  Time seen: Approximately 3:39 PM  I have reviewed the triage vital signs and the nursing notes.   HISTORY  Chief Complaint Hand Problem (swelling to R hand)   HPI Trevor Torres is a 47 y.o. male who presents to the emergency department for evaluation and treatment of right index finger swelling.  Patient states that he has very dry skin and it often cracks and bleeds.  Over the past 2 days, the finger has become swollen and mildly erythematous.  He has a history of MRSA as well as cellulitis in the hand that required hospitalization and transfer.  He denies specific injury.  He denies fever.  No alleviating measures have been attempted for this complaint with the exception of taking some Tylenol.  Past Medical History:  Diagnosis Date  . Anxiety   . Cellulitis 01-2015   left arm  . GERD (gastroesophageal reflux disease)   . Headache   . HTN (hypertension) 01/07/2015  . Hypothyroidism   . MRSA infection    h/o years ago  . Sleep apnea     Patient Active Problem List   Diagnosis Date Noted  . Shoulder pain 02/07/2015  . HTN (hypertension) 01/07/2015  . Vitamin B12 deficiency 01/03/2015  . 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 Major depressive disorder, recurrent, severe without psychotic behavior (HCC) 01/02/2015  . Hypothyroidism 01/02/2015  . Opioid use disorder, mild, abuse (HCC) 01/02/2015  . Rotator cuff injury 01/02/2015  . 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 COLONOSCOPY    . ESOPHAGOGASTRODUODENOSCOPY    . LAPAROSCOPIC GASTRIC SLEEVE RESECTION    . SHOULDER ARTHROSCOPY WITH OPEN ROTATOR CUFF REPAIR Right 02/06/2015   Procedure: SHOULDER ARTHROSCOPY WITH OPEN ROTATOR CUFF REPAIR;  Surgeon: Juanell Fairly, MD;  Location:  [facility] ORS;  Service: Orthopedics;  Laterality: Right;    Prior to Admission medications   Medication Sig Start Date End Date Taking? Authorizing Provider  amLODipine (NORVASC) 10 MG tablet Take 1 tablet (10 mg total) by mouth daily. 01/07/15   Jimmy Footman, MD  atomoxetine (STRATTERA) 40 MG capsule Take 1 capsule (40 mg total) by mouth daily. 01/07/15   Jimmy Footman, MD  cyanocobalamin (,VITAMIN B-12,) 1000 MCG/ML injection Inject 1 mL (1,000 mcg total) into the skin daily. Patient taking differently: Inject 1,000 mcg into the skin every 30 (thirty) days.  01/07/15   Jimmy Footman, MD  hydrOXYzine (ATARAX/VISTARIL) 25 MG tablet Take 1 tablet (25 mg total) by mouth 3 (three) times daily as needed for anxiety (insomnia or anxiety). Patient not taking: Reported on 02/07/2015 01/07/15   Jimmy Footman, MD  hydrOXYzine (ATARAX/VISTARIL) 50 MG tablet Take 50 mg by mouth 3 (three) times daily as needed for anxiety (or insomnia).    [provider]  levothyroxine (SYNTHROID, LEVOTHROID) 150 MCG tablet Take 1 tablet (150 mcg total) by mouth daily before breakfast. 01/07/15   Jimmy Footman, MD  oxyCODONE (OXY IR/ROXICODONE) 5 MG immediate release tablet Take 1-2 tablets (5-10 mg total) by mouth every 4 (four) hours as needed for severe pain. 02/06/15   Juanell Fairly, MD  sertraline (ZOLOFT) 50 MG tablet Take 1 tablet (50 mg total) by mouth at bedtime. 01/07/15   Jimmy Footman, MD  sulfamethoxazole-trimethoprim (BACTRIM DS,SEPTRA DS) 800-160 MG tablet Take 1 tablet by mouth 2 (two) times daily. 06/22/17  Ruba Outen B, FNP  traMADol (ULTRAM) 50 MG tablet Take 1 tablet (50 mg total) by mouth every 6 (six) hours as needed. 06/22/17   Duell Holdren, Rulon Eisenmenger B, FNP  traZODone (DESYREL) 100 MG tablet Take 100 mg by mouth at bedtime as needed for sleep.    [provider]  venlafaxine XR (EFFEXOR-XR) 37.5 MG 24 hr capsule Take 1  capsule (37.5 mg total) by mouth daily with breakfast. Patient not taking: Reported on 02/07/2015 01/07/15   Jimmy Footman, MD  mupirocin nasal ointment (BACTROBAN) 2 % Place 1 application into the nose 2 (two) times daily. Use one-half of tube in each nostril twice daily for five (5) days. After application, press sides of nose together and gently massage.  12/15/14  [provider]    Allergies Nsaids  Family History  Problem Relation Age of Onset  . Diabetes Mother   . Hypertension Mother   . Cancer Mother   . Cancer Father     Social History Social History   Tobacco Use  . Smoking status: Never Smoker  . Smokeless tobacco: Never Used  Substance Use Topics  . Alcohol use: Yes    Comment: socially  . Drug use: No    Review of Systems  Constitutional: Negative for fever. Respiratory: Negative for cough or shortness of breath.  Musculoskeletal: Positive for myalgias Skin: Positive for right index finger edema and erythema. Neurological: Negative for numbness or paresthesias. ____________________________________________   PHYSICAL EXAM:  VITAL SIGNS: ED Triage Vitals  Enc Vitals Group     BP 06/22/17 1357 139/89     Pulse Rate 06/22/17 1357 73     Resp 06/22/17 1357 18     Temp 06/22/17 1357 98.8 F (37.1 C)     Temp Source 06/22/17 1357 Oral     SpO2 06/22/17 1357 100 %     Weight 06/22/17 1358 230 lb (104.3 kg)     Height --      Head Circumference --      Peak Flow --      Pain Score 06/22/17 1358 4     Pain Loc --      Pain Edu? --      Excl. in GC? --      Constitutional: Well appearing. Eyes: Conjunctivae are clear without discharge or drainage. Nose: No rhinorrhea noted. Mouth/Throat: Airway is patent.  Neck: No stridor. Unrestricted range of motion observed.  Cardiovascular: Capillary refill is <3 seconds.  Respiratory: Respirations are even and unlabored.. Musculoskeletal: Flexion of the right index finger is difficult  secondary to pain and swelling, but possible.  Neurologic: Awake, alert, and oriented x 4.  Skin: 2 skin fissures are noted along the PIP and the DIP of the right index finger.  Skin overlying the finger is mildly erythematous.  There is no lymphangitis.  ____________________________________________   LABS (all labs ordered are listed, but only abnormal results are displayed)  Labs Reviewed  CBC - Abnormal; Notable for the following components:      Result Value   RBC 4.17 (*)    All other components within normal limits  BASIC METABOLIC PANEL - Abnormal; Notable for the following components:   CO2 21 (*)    Glucose, Bld 100 (*)    All other components within normal limits   ____________________________________________  EKG  Not indicated ____________________________________________  RADIOLOGY  Not indicated ____________________________________________   PROCEDURES  Procedures ____________________________________________   INITIAL IMPRESSION / ASSESSMENT AND PLAN / ED COURSE  Trevor Torres is a 47 y.o. male who presents to the emergency department for evaluation and treatment of right index finger pain, erythema, and edema.  Patient has right hand dominant.  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  He will be given a prescription for Bactrim and tramadol and advised to continue taking the Tylenol during the day.  Light duty was strongly encouraged at least for the next 24 hours and a work note was provided to that effect.  Strict return precautions were discussed.  The patient verbalized understanding.  Medications - No data to display   Pertinent labs & imaging results that were available during my care of the patient were reviewed by me and considered in my medical decision making (see chart for details). ____________________________________________   FINAL CLINICAL IMPRESSION(S) / ED  DIAGNOSES  Final diagnoses:  Cellulitis of finger of right hand  Skin fissures    ED Discharge Orders        Ordered    sulfamethoxazole-trimethoprim (BACTRIM DS,SEPTRA DS) 800-160 MG tablet  2 times daily     06/22/17 1535    traMADol (ULTRAM) 50 MG tablet  Every 6 hours PRN     06/22/17 1535       Note:  This document was prepared using Dragon voice recognition software and may include unintentional dictation errors.    Chinita Pesterriplett, Jaquari Reckner B, FNP 06/22/17 1544    Nita SickleVeronese, [place], MD 06/24/17 1500

## 2017-06-22 NOTE — ED Notes (Signed)
Spoke to Dr. Lamont Snowballifenbark regarding pt's swelling to finger.  EDP gave VO for CBC and BMP at this time.

## 2017-12-27 ENCOUNTER — Encounter: Payer: Self-pay | Admitting: *Deleted

## 2017-12-27 ENCOUNTER — Other Ambulatory Visit: Payer: Self-pay

## 2017-12-27 ENCOUNTER — Emergency Department: Payer: Self-pay

## 2017-12-27 ENCOUNTER — Emergency Department
Admission: EM | Admit: 2017-12-27 | Discharge: 2017-12-27 | Disposition: A | Discharge status: Home / Self Care | Payer: Self-pay | Attending: Emergency Medicine | Admitting: Emergency Medicine

## 2017-12-27 DIAGNOSIS — I10 Essential (primary) hypertension: Secondary | ICD-10-CM | POA: Insufficient documentation

## 2017-12-27 DIAGNOSIS — K2970 Gastritis, unspecified, without bleeding: Secondary | ICD-10-CM | POA: Insufficient documentation

## 2017-12-27 DIAGNOSIS — E039 Hypothyroidism, unspecified: Secondary | ICD-10-CM | POA: Insufficient documentation

## 2017-12-27 DIAGNOSIS — R1011 Right upper quadrant pain: Secondary | ICD-10-CM | POA: Insufficient documentation

## 2017-12-27 DIAGNOSIS — Z79899 Other long term (current) drug therapy: Secondary | ICD-10-CM | POA: Insufficient documentation

## 2017-12-27 LAB — CBC
HCT: 41.1 % (ref 40.0–52.0)
Hemoglobin: 14.4 g/dL (ref 13.0–18.0)
MCH: 33.7 pg (ref 26.0–34.0)
MCHC: 34.9 g/dL (ref 32.0–36.0)
MCV: 96.5 fL (ref 80.0–100.0)
Platelets: 359 10*3/uL (ref 150–440)
RBC: 4.26 MIL/uL — AB (ref 4.40–5.90)
RDW: 13.5 % (ref 11.5–14.5)
WBC: 6.5 10*3/uL (ref 3.8–10.6)

## 2017-12-27 LAB — URINALYSIS, COMPLETE (UACMP) WITH MICROSCOPIC
BACTERIA UA: NONE SEEN
BILIRUBIN URINE: NEGATIVE
GLUCOSE, UA: NEGATIVE mg/dL
Hgb urine dipstick: NEGATIVE
KETONES UR: NEGATIVE mg/dL
LEUKOCYTES UA: NEGATIVE
Nitrite: NEGATIVE
PROTEIN: NEGATIVE mg/dL
SQUAMOUS EPITHELIAL / LPF: NONE SEEN (ref 0–5)
Specific Gravity, Urine: 1.008 (ref 1.005–1.030)
pH: 5 (ref 5.0–8.0)

## 2017-12-27 LAB — LIPASE, BLOOD: LIPASE: 41 U/L (ref 11–51)

## 2017-12-27 LAB — COMPREHENSIVE METABOLIC PANEL
ALT: 31 U/L (ref 0–44)
ANION GAP: 5 (ref 5–15)
AST: 27 U/L (ref 15–41)
Albumin: 4.4 g/dL (ref 3.5–5.0)
Alkaline Phosphatase: 41 U/L (ref 38–126)
BUN: 19 mg/dL (ref 6–20)
CHLORIDE: 108 mmol/L (ref 98–111)
CO2: 26 mmol/L (ref 22–32)
Calcium: 9.1 mg/dL (ref 8.9–10.3)
Creatinine, Ser: 1.09 mg/dL (ref 0.61–1.24)
Glucose, Bld: 101 mg/dL — ABNORMAL HIGH (ref 70–99)
Potassium: 4 mmol/L (ref 3.5–5.1)
Sodium: 139 mmol/L (ref 135–145)
Total Bilirubin: 1 mg/dL (ref 0.3–1.2)
Total Protein: 7.2 g/dL (ref 6.5–8.1)

## 2017-12-27 MED ORDER — FAMOTIDINE 40 MG PO TABS: 40.0000 mg | ORAL_TABLET | Freq: Every evening | ORAL | 0 refills | Status: DC

## 2017-12-27 MED ORDER — SUCRALFATE 1 G PO TABS: 1.0000 g | ORAL_TABLET | Freq: Four times a day (QID) | ORAL | 0 refills | Status: DC

## 2017-12-27 MED ORDER — FENTANYL CITRATE (PF) 100 MCG/2ML IJ SOLN
50.0000 ug | Freq: Once | INTRAMUSCULAR | Status: DC
  Filled 2017-12-27: qty 2

## 2017-12-27 MED ORDER — ACETAMINOPHEN 500 MG PO TABS
1000.0000 mg | ORAL_TABLET | Freq: Once | ORAL | Status: AC
  Administered 2017-12-27: 1000 mg via ORAL
  Filled 2017-12-27: qty 2

## 2017-12-27 MED ORDER — GI COCKTAIL ~~LOC~~
30.0000 mL | Freq: Once | ORAL | Status: AC
  Administered 2017-12-27: 30 mL via ORAL
  Filled 2017-12-27: qty 30

## 2017-12-27 NOTE — Discharge Instructions (Addendum)
Please seek medical attention for any high fevers, chest pain, shortness of breath, change in behavior, persistent vomiting, bloody stool or any other new or concerning symptoms.  

## 2017-12-27 NOTE — ED Provider Notes (Signed)
[facility] Emergency Department Provider Note   ____________________________________________   I have reviewed the triage vital signs and the nursing notes.   HISTORY  Chief Complaint Abdominal Pain   History limited by: Not Limited   HPI Trevor Torres is a 47 y.o. male who presents to the emergency department today because of concerns for abdominal pain.  Located in the right upper quadrant.  It is severe.  States it started shortly after eating lunch today.  He denies any radiation of the pain.  He denies any shortness of breath or cough.  Denies any vomiting.  Denies similar pain in the past.  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   Per medical record review patient has a history of gastric sleeve  Past Medical History:  Diagnosis Date  . Anxiety   . Cellulitis 01-2015   left arm  . GERD (gastroesophageal reflux disease)   . Headache   . HTN (hypertension) 01/07/2015  . Hypothyroidism   . MRSA infection    h/o years ago  . Sleep apnea     Patient Active Problem List   Diagnosis Date Noted  . Shoulder pain 02/07/2015  . HTN (hypertension) 01/07/2015  . Vitamin B12 deficiency 01/03/2015  . 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 Major depressive disorder, recurrent, severe without psychotic behavior (HCC) 01/02/2015  . Hypothyroidism 01/02/2015  . Opioid use disorder, mild, abuse (HCC) 01/02/2015  . Rotator cuff injury 01/02/2015  . 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 COLONOSCOPY    . ESOPHAGOGASTRODUODENOSCOPY    . LAPAROSCOPIC GASTRIC SLEEVE RESECTION    . SHOULDER ARTHROSCOPY WITH OPEN ROTATOR CUFF REPAIR Right 02/06/2015   Procedure: SHOULDER ARTHROSCOPY WITH OPEN ROTATOR CUFF REPAIR;  Surgeon: Juanell Fairly, MD;  Location: [facility] ORS;  Service: Orthopedics;  Laterality:  Right;    Prior to Admission medications   Medication Sig Start Date End Date Taking? Authorizing Provider  amLODipine (NORVASC) 10 MG tablet Take 1 tablet (10 mg total) by mouth daily. 01/07/15   Jimmy Footman, MD  atomoxetine (STRATTERA) 40 MG capsule Take 1 capsule (40 mg total) by mouth daily. 01/07/15   Jimmy Footman, MD  cyanocobalamin (,VITAMIN B-12,) 1000 MCG/ML injection Inject 1 mL (1,000 mcg total) into the skin daily. Patient taking differently: Inject 1,000 mcg into the skin every 30 (thirty) days.  01/07/15   Jimmy Footman, MD  hydrOXYzine (ATARAX/VISTARIL) 25 MG tablet Take 1 tablet (25 mg total) by mouth 3 (three) times daily as needed for anxiety (insomnia or anxiety). Patient not taking: Reported on 02/07/2015 01/07/15   Jimmy Footman, MD  hydrOXYzine (ATARAX/VISTARIL) 50 MG tablet Take 50 mg by mouth 3 (three) times daily as needed for anxiety (or insomnia).    [provider]  levothyroxine (SYNTHROID, LEVOTHROID) 150 MCG tablet Take 1 tablet (150 mcg total) by mouth daily before breakfast. 01/07/15   Jimmy Footman, MD  oxyCODONE (OXY IR/ROXICODONE) 5 MG immediate release tablet Take 1-2 tablets (5-10 mg total) by mouth every 4 (four) hours as needed for severe pain. 02/06/15   Juanell Fairly, MD  sertraline (ZOLOFT) 50 MG tablet Take 1 tablet (50 mg total) by mouth at bedtime. 01/07/15   Jimmy Footman, MD  sulfamethoxazole-trimethoprim (BACTRIM DS,SEPTRA DS) 800-160 MG tablet Take 1 tablet by mouth 2 (two) times daily. 06/22/17   Triplett, Rulon Eisenmenger  B, FNP  traMADol (ULTRAM) 50 MG tablet Take 1 tablet (50 mg total) by mouth every 6 (six) hours as needed. 06/22/17   Triplett, Rulon Eisenmengerari B, FNP  traZODone (DESYREL) 100 MG tablet Take 100 mg by mouth at bedtime as needed for sleep.    [provider]  venlafaxine XR (EFFEXOR-XR) 37.5 MG 24 hr capsule Take 1 capsule (37.5 mg total) by mouth daily with  breakfast. Patient not taking: Reported on 02/07/2015 01/07/15   Jimmy FootmanHernandez-Gonzalez, Andrea, MD    Allergies Nsaids  Family History  Problem Relation Age of Onset  . Diabetes Mother   . Hypertension Mother   . Cancer Mother   . Cancer Father     Social History Social History   Tobacco Use  . Smoking status: Never Smoker  . Smokeless tobacco: Never Used  Substance Use Topics  . Alcohol use: Yes    Comment: socially  . Drug use: No    Review of Systems Constitutional: No fever/chills Eyes: No visual changes. ENT: No sore throat. Cardiovascular: Denies chest pain. Respiratory: Denies shortness of breath. Gastrointestinal: Positive for right upper quadrant pain Genitourinary: Negative for dysuria. Musculoskeletal: Negative for back pain. Skin: Negative for rash. Neurological: Negative for headaches, focal weakness or numbness.  ____________________________________________   PHYSICAL EXAM:  VITAL SIGNS: ED Triage Vitals [12/27/17 1357]  Enc Vitals Group     BP 131/85     Pulse Rate 63     Resp 16     Temp 98.2 F (36.8 C)     Temp Source Oral     SpO2 98 %     Weight 185 lb (83.9 kg)     Height 5\' 9"  (1.753 m)     Head Circumference      Peak Flow      Pain Score 6   Constitutional: Alert and oriented.  Eyes: Conjunctivae are normal.  ENT      Head: Normocephalic and atraumatic.      Nose: No congestion/rhinnorhea.      Mouth/Throat: Mucous membranes are moist.      Neck: No stridor. Hematological/Lymphatic/Immunilogical: No cervical lymphadenopathy. Cardiovascular: Normal rate, regular rhythm.  No murmurs, rubs, or gallops.  Respiratory: Normal respiratory effort without tachypnea nor retractions. Breath sounds are clear and equal bilaterally. No wheezes/rales/rhonchi. Gastrointestinal: Soft and tender to palpation in the right upper quadrant.  No rebound. No guarding.  Genitourinary: Deferred Musculoskeletal: Normal range of motion in all  extremities. No lower extremity edema. Neurologic:  Normal speech and language. No gross focal neurologic deficits are appreciated.  Skin:  Skin is warm, dry and intact. No rash noted. Psychiatric: Mood and affect are normal. Speech and behavior are normal. Patient exhibits appropriate insight and judgment.  ____________________________________________    LABS (pertinent positives/negatives)  CBC wbc 6.5, hgb 14.4, plt 359 Lipase 41 CMP wnl except glu 101 UA clear, negative nitrite, leukocytes, 0-5 rbc and wbc ____________________________________________   EKG  None  ____________________________________________    RADIOLOGY  None  ____________________________________________   PROCEDURES  Procedures  ____________________________________________   INITIAL IMPRESSION / ASSESSMENT AND PLAN / ED COURSE  Pertinent labs & imaging results that were available during my care of the patient were reviewed by me and considered in my medical decision making (see chart for details).   Presented to the emergency department today because of concerns for right upper quadrant pain.  Started shortly after the patient eat.  On exam he was tender in the right upper quadrant area.  I did initially order ultrasound however patient stated he had financial constraints.  He preferred a bedside ultrasound.  On my bedside ultrasound I did not see any obvious gallbladder wall thickening, pericholecystic fluid or gallstones.  He did feel somewhat better after GI cocktail this point I wonder if ulcers or gastritis more likely.  Did discuss with patient strict return precautions and potential for needing imaging if symptoms do not improve.  ____________________________________________   FINAL CLINICAL IMPRESSION(S) / ED DIAGNOSES  Final diagnoses:  RUQ pain  Gastritis, presence of bleeding unspecified, unspecified chronicity, unspecified gastritis type     Note: This dictation was prepared  with Dragon dictation. Any transcriptional errors that result from this process are unintentional     Phineas SemenGoodman, Dyan Labarbera, MD 12/27/17 1816

## 2017-12-27 NOTE — ED Triage Notes (Signed)
PT to ED reporting sudden onset of RLQ pain that started 1 hour ago. Nausea but no vomiting or diarrhea reported. Tenderness upon palpation. NO changes in urine. No fever.

## 2018-01-04 ENCOUNTER — Emergency Department
Admission: EM | Admit: 2018-01-04 | Discharge: 2018-01-04 | Disposition: A | Discharge status: Home / Self Care | Payer: Self-pay | Attending: Emergency Medicine | Admitting: Emergency Medicine

## 2018-01-04 ENCOUNTER — Emergency Department: Payer: Self-pay

## 2018-01-04 DIAGNOSIS — Z79899 Other long term (current) drug therapy: Secondary | ICD-10-CM | POA: Insufficient documentation

## 2018-01-04 DIAGNOSIS — I10 Essential (primary) hypertension: Secondary | ICD-10-CM | POA: Insufficient documentation

## 2018-01-04 DIAGNOSIS — R1011 Right upper quadrant pain: Secondary | ICD-10-CM

## 2018-01-04 DIAGNOSIS — K824 Cholesterolosis of gallbladder: Secondary | ICD-10-CM | POA: Insufficient documentation

## 2018-01-04 DIAGNOSIS — E039 Hypothyroidism, unspecified: Secondary | ICD-10-CM | POA: Insufficient documentation

## 2018-01-04 LAB — URINALYSIS, COMPLETE (UACMP) WITH MICROSCOPIC
Bacteria, UA: NONE SEEN
Bilirubin Urine: NEGATIVE
Glucose, UA: NEGATIVE mg/dL
HGB URINE DIPSTICK: NEGATIVE
Ketones, ur: NEGATIVE mg/dL
Leukocytes, UA: NEGATIVE
Nitrite: NEGATIVE
Protein, ur: NEGATIVE mg/dL
Specific Gravity, Urine: 1.012 (ref 1.005–1.030)
Squamous Epithelial / HPF: NONE SEEN (ref 0–5)
WBC, UA: NONE SEEN WBC/hpf (ref 0–5)
pH: 6 (ref 5.0–8.0)

## 2018-01-04 LAB — COMPREHENSIVE METABOLIC PANEL
ALBUMIN: 4.2 g/dL (ref 3.5–5.0)
ALT: 31 U/L (ref 0–44)
AST: 27 U/L (ref 15–41)
Alkaline Phosphatase: 47 U/L (ref 38–126)
Anion gap: 5 (ref 5–15)
BUN: 19 mg/dL (ref 6–20)
CHLORIDE: 107 mmol/L (ref 98–111)
CO2: 26 mmol/L (ref 22–32)
CREATININE: 1.18 mg/dL (ref 0.61–1.24)
Calcium: 9 mg/dL (ref 8.9–10.3)
GFR calc Af Amer: >60 mL/min (ref >60)
Glucose, Bld: 87 mg/dL (ref 70–99)
Potassium: 4 mmol/L (ref 3.5–5.1)
Sodium: 138 mmol/L (ref 135–145)
Total Bilirubin: 0.8 mg/dL (ref 0.3–1.2)
Total Protein: 7 g/dL (ref 6.5–8.1)

## 2018-01-04 LAB — CBC
HCT: 40.5 % (ref 40.0–52.0)
Hemoglobin: 14.1 g/dL (ref 13.0–18.0)
MCH: 33.4 pg (ref 26.0–34.0)
MCHC: 34.9 g/dL (ref 32.0–36.0)
MCV: 95.7 fL (ref 80.0–100.0)
PLATELETS: 358 10*3/uL (ref 150–440)
RBC: 4.23 MIL/uL — ABNORMAL LOW (ref 4.40–5.90)
RDW: 13.7 % (ref 11.5–14.5)
WBC: 5 10*3/uL (ref 3.8–10.6)

## 2018-01-04 LAB — LIPASE, BLOOD: LIPASE: 39 U/L (ref 11–51)

## 2018-01-04 MED ORDER — TRAMADOL HCL 50 MG PO TABS
50.0000 mg | ORAL_TABLET | Freq: Once | ORAL | Status: AC
  Administered 2018-01-04: 50 mg via ORAL
  Filled 2018-01-04: qty 1

## 2018-01-04 MED ORDER — TRAMADOL HCL 50 MG PO TABS: 50.0000 mg | ORAL_TABLET | Freq: Four times a day (QID) | ORAL | 0 refills | Status: DC | PRN

## 2018-01-04 MED ORDER — ONDANSETRON 4 MG PO TBDP: 4.0000 mg | ORAL_TABLET | Freq: Three times a day (TID) | ORAL | 0 refills | Status: DC | PRN

## 2018-01-04 MED ORDER — ACETAMINOPHEN 500 MG PO TABS
1000.0000 mg | ORAL_TABLET | Freq: Once | ORAL | Status: AC
  Administered 2018-01-04: 1000 mg via ORAL
  Filled 2018-01-04: qty 2

## 2018-01-04 MED ORDER — ONDANSETRON 4 MG PO TBDP
4.0000 mg | ORAL_TABLET | Freq: Once | ORAL | Status: AC
  Administered 2018-01-04: 4 mg via ORAL
  Filled 2018-01-04: qty 1

## 2018-01-04 NOTE — Discharge Instructions (Addendum)
Your US showed a polyp in your gallbladder which could be causing obstruction of the gallbladder and pain.  Follow-up with Dr. Everlene FarrierPabon for further evaluation of your gallbladder.  If the pain becomes more severe, if you develop new pain, if you have a fever, if you have worsening nausea or vomiting please return to the emergency room.

## 2018-01-04 NOTE — ED Triage Notes (Signed)
Pt to Ed via POV for sudden right abd pain. Pt was sen here last week for the same pain. Pt denies vomting and diarrhea, pt states he is nausea from time to time.

## 2018-01-04 NOTE — ED Notes (Signed)
First Nurse Note: Patient complaining of right sided abdominal pain, states he has been seen here previously for same.  Holding side.

## 2018-01-04 NOTE — ED Triage Notes (Signed)
Urine sent at this time.

## 2018-01-04 NOTE — ED Notes (Signed)
Blood was drawn and sent

## 2018-01-04 NOTE — ED Notes (Signed)
Patient transported to CT 

## 2018-01-04 NOTE — ED Provider Notes (Signed)
[facility] Emergency Department Provider Note  ____________________________________________  Time seen: Approximately 3:17 PM  I have reviewed the triage vital signs and the nursing notes.   HISTORY  Chief Complaint Abdominal Pain   HPI Trevor Torres is a 47 y.o. male with a history of sleeve gastrectomy, hypothyroidism, sleep apnea, hypertension, GERD, and anxiety who presents for evaluation of abdominal pain.  Patient reports that he has had this pain for 8 days.  Was seen here initially with normal labs.  Right upper quadrant ultrasound was recommended but patient refused.  He did undergo a bedside ultrasound by Dr. Archie Balboa which was unremarkable.  He felt better after GI cocktail and was sent home.  Patient reports that since then he has been having intermittent right upper quadrant abdominal pain which became more severe today.  Sometimes the pain is worse postprandially but not all times.  The pain is sharp, crampy like, severe, located in the right upper quadrant, nonradiating.  Patient reports today had nausea and several episodes of nonbloody nonbilious emesis.  Currently the pain is 7 out of 10.  No pleuritic chest pain, no shortness of breath, no personal or family history of blood clots, no history of kidney stones.  Past Medical History:  Diagnosis Date  . Anxiety   . Cellulitis 01-2015   left arm  . GERD (gastroesophageal reflux disease)   . Headache   . HTN (hypertension) 01/07/2015  . Hypothyroidism   . MRSA infection    h/o years ago  . Sleep apnea     Patient Active Problem List   Diagnosis Date Noted  . Shoulder pain 02/07/2015  . HTN (hypertension) 01/07/2015  . Vitamin B12 deficiency 01/03/2015  . Sedative hypnotic withdrawal ([place]) 01/02/2015  . 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 Major depressive disorder, recurrent, severe without psychotic behavior ([place]) 01/02/2015  . Hypothyroidism 01/02/2015    . Opioid use disorder, mild, abuse ([place]) 01/02/2015  . Rotator cuff injury 01/02/2015  . ADH disorder ([place]) 01/02/2015    Past Surgical History:  Procedure Laterality Date  . COLONOSCOPY    . ESOPHAGOGASTRODUODENOSCOPY    . LAPAROSCOPIC GASTRIC SLEEVE RESECTION    . SHOULDER ARTHROSCOPY WITH OPEN ROTATOR CUFF REPAIR Right 02/06/2015   Procedure: SHOULDER ARTHROSCOPY WITH OPEN ROTATOR CUFF REPAIR;  Surgeon: Thornton Park, MD;  Location: [facility] ORS;  Service: Orthopedics;  Laterality: Right;    Prior to Admission medications   Medication Sig Start Date End Date Taking? Authorizing Provider  amLODipine (NORVASC) 10 MG tablet Take 1 tablet (10 mg total) by mouth daily. 01/07/15   Hildred Priest, MD  atomoxetine (STRATTERA) 40 MG capsule Take 1 capsule (40 mg total) by mouth daily. 01/07/15   Hildred Priest, MD  cyanocobalamin (,VITAMIN B-12,) 1000 MCG/ML injection Inject 1 mL (1,000 mcg total) into the skin daily. Patient taking differently: Inject 1,000 mcg into the skin every 30 (thirty) days.  01/07/15   Hildred Priest, MD  famotidine (PEPCID) 40 MG tablet Take 1 tablet (40 mg total) by mouth every evening. 12/27/17 12/27/18  Nance Pear, MD  hydrOXYzine (ATARAX/VISTARIL) 25 MG tablet Take 1 tablet (25 mg total) by mouth 3 (three) times daily as needed for anxiety (insomnia or anxiety). Patient not taking: Reported on 02/07/2015 01/07/15   Hildred Priest, MD  hydrOXYzine (ATARAX/VISTARIL) 50 MG tablet Take 50 mg by mouth 3 (three) times daily as needed for anxiety (or insomnia).    [provider]  levothyroxine (  SYNTHROID, LEVOTHROID) 150 MCG tablet Take 1 tablet (150 mcg total) by mouth daily before breakfast. 01/07/15   Hildred Priest, MD  ondansetron (ZOFRAN ODT) 4 MG disintegrating tablet Take 1 tablet (4 mg total) by mouth every 8 (eight) hours as needed for nausea or vomiting. 01/04/18   Alfred Levins, [place], MD   oxyCODONE (OXY IR/ROXICODONE) 5 MG immediate release tablet Take 1-2 tablets (5-10 mg total) by mouth every 4 (four) hours as needed for severe pain. 02/06/15   Thornton Park, MD  sertraline (ZOLOFT) 50 MG tablet Take 1 tablet (50 mg total) by mouth at bedtime. 01/07/15   Hildred Priest, MD  sucralfate (CARAFATE) 1 g tablet Take 1 tablet (1 g total) by mouth 4 (four) times daily. 12/27/17   Nance Pear, MD  sulfamethoxazole-trimethoprim (BACTRIM DS,SEPTRA DS) 800-160 MG tablet Take 1 tablet by mouth 2 (two) times daily. 06/22/17   Triplett, Johnette Abraham B, FNP  traMADol (ULTRAM) 50 MG tablet Take 1 tablet (50 mg total) by mouth every 6 (six) hours as needed. 01/04/18 01/04/19  Rudene Re, MD  traZODone (DESYREL) 100 MG tablet Take 100 mg by mouth at bedtime as needed for sleep.    [provider]  venlafaxine XR (EFFEXOR-XR) 37.5 MG 24 hr capsule Take 1 capsule (37.5 mg total) by mouth daily with breakfast. Patient not taking: Reported on 02/07/2015 01/07/15   Hildred Priest, MD    Allergies Nsaids  Family History  Problem Relation Age of Onset  . Diabetes Mother   . Hypertension Mother   . Cancer Mother   . Cancer Father     Social History Social History   Tobacco Use  . Smoking status: Never Smoker  . Smokeless tobacco: Never Used  Substance Use Topics  . Alcohol use: Yes    Comment: socially  . Drug use: No    Review of Systems  Constitutional: Negative for fever. Eyes: Negative for visual changes. ENT: Negative for sore throat. Neck: No neck pain  Cardiovascular: Negative for chest pain. Respiratory: Negative for shortness of breath. Gastrointestinal: + RUQ abdominal pain, nausea, and vomiting. No diarrhea. Genitourinary: Negative for dysuria. Musculoskeletal: Negative for back pain. Skin: Negative for rash. Neurological: Negative for headaches, weakness or numbness. Psych: No SI or  HI  ____________________________________________   PHYSICAL EXAM:  VITAL SIGNS: ED Triage Vitals  Enc Vitals Group     BP 01/04/18 1112 119/83     Pulse Rate 01/04/18 1112 70     Resp 01/04/18 1112 20     Temp 01/04/18 1112 98.4 F (36.9 C)     Temp Source 01/04/18 1112 Oral     SpO2 01/04/18 1112 100 %     Weight 01/04/18 1112 184 lb (83.5 kg)     Height 01/04/18 1112 '5\' 9"'$  (1.753 m)     Head Circumference --      Peak Flow --      Pain Score 01/04/18 1121 8     Pain Loc --      Pain Edu? --      Excl. in [place]? --     Constitutional: Alert and oriented. Well appearing and in no apparent distress. HEENT:      Head: Normocephalic and atraumatic.         Eyes: Conjunctivae are normal. Sclera is non-icteric.       Mouth/Throat: Mucous membranes are moist.       Neck: Supple with no signs of meningismus. Cardiovascular: Regular rate and rhythm. No murmurs, gallops,  or rubs. 2+ symmetrical distal pulses are present in all extremities. No JVD. Respiratory: Normal respiratory effort. Lungs are clear to auscultation bilaterally. No wheezes, crackles, or rhonchi.  Gastrointestinal: Soft, ttp over the RUQ with positive Murphy's sign, non distended with positive bowel sounds. No rebound or guarding. Musculoskeletal: Nontender with normal range of motion in all extremities. No edema, cyanosis, or erythema of extremities. Neurologic: Normal speech and language. Face is symmetric. Moving all extremities. No gross focal neurologic deficits are appreciated. Skin: Skin is warm, dry and intact. No rash noted. Psychiatric: Mood and affect are normal. Speech and behavior are normal.  ____________________________________________   LABS (all labs ordered are listed, but only abnormal results are displayed)  Labs Reviewed  CBC - Abnormal; Notable for the following components:      Result Value   RBC 4.23 (*)    All other components within normal limits  URINALYSIS, COMPLETE (UACMP) WITH  MICROSCOPIC - Abnormal; Notable for the following components:   Color, Urine STRAW (*)    APPearance CLEAR (*)    All other components within normal limits  LIPASE, BLOOD  COMPREHENSIVE METABOLIC PANEL   ____________________________________________  EKG  none  ____________________________________________  RADIOLOGY  I have personally reviewed the images performed during this visit and I agree with the Radiologist's read.   Interpretation by Radiologist:  US Abdomen Limited Ruq  Result Date: 01/04/2018 CLINICAL DATA:  47 y/o M; right upper quadrant abdominal pain for 1 week. EXAM: ULTRASOUND ABDOMEN LIMITED RIGHT UPPER QUADRANT COMPARISON:  11/27/2013 right upper quadrant ultrasound. FINDINGS: Gallbladder: Multiple probable polyps along the wall of the gallbladder measuring up to 4 mm. No gallbladder wall thickening or pericholecystic fluid. No gallstone identified. Positive sonographic Murphy's sign. Common bile duct: Diameter: 4.4 mm Liver: No focal lesion identified. Within normal limits in parenchymal echogenicity. Portal vein is patent on color Doppler imaging with normal direction of blood flow towards the liver. IMPRESSION: 1. Positive sonographic Murphy's sign reported by ultrasound tech. No secondary signs of acute cholecystitis. 2. Multiple stable gallbladder polyps given differences in technique. Electronically Signed   By: Kristine Garbe M.D.   On: 01/04/2018 15:30      ____________________________________________   PROCEDURES  Procedure(s) performed: None Procedures Critical Care performed:  None ____________________________________________   INITIAL IMPRESSION / ASSESSMENT AND PLAN / ED COURSE  47 y.o. male with a history of sleeve gastrectomy, hypothyroidism, sleep apnea, hypertension, GERD, and anxiety who presents for evaluation of intermittent RUQ abdominal pain x 8 days associated with nausea and vomiting. Ddx gallbladder pathology versus  pancreatitis versus peptic ulcer disease versus gastritis versus SBO versus kidney stone.  Labs showing normal lipase, normal LFTs and alk phos, normal kidney function, no leukocytosis, UA negative for hematuria or UTI. A formal RUQ showed positive Murphy sign with multiple gallbladder polyps but no evidence of acute cholecystitis.  I believe at this time the patient's polyps could be causing gallbladder obstruction and could be the etiology of his pain.  His labs did not show any evidence of acute cholecystitis and neither did the ultrasound.  Therefore he will be referred to Dr. Dahlia Byes for outpatient evaluation.  Patient will be given a short course of tramadol and Zofran for pain.  Discussed return precautions for new or worsening pain, fever, nausea or vomiting.      As part of my medical decision making, I reviewed the following data within the [place] notes reviewed and incorporated, Labs reviewed , Old chart  reviewed, Radiograph reviewed , Notes from prior ED visits and [place] Controlled Substance Database    Pertinent labs & imaging results that were available during my care of the patient were reviewed by me and considered in my medical decision making (see chart for details).    ____________________________________________   FINAL CLINICAL IMPRESSION(S) / ED DIAGNOSES  Final diagnoses:  RUQ abdominal pain  Gallbladder polyp      NEW MEDICATIONS STARTED DURING THIS VISIT:  ED Discharge Orders         Ordered    ondansetron (ZOFRAN ODT) 4 MG disintegrating tablet  Every 8 hours PRN     01/04/18 1610    traMADol (ULTRAM) 50 MG tablet  Every 6 hours PRN     01/04/18 1610           Note:  This document was prepared using Dragon voice recognition software and may include unintentional dictation errors.    Rudene Re, MD 01/04/18 [phone]

## 2018-01-07 ENCOUNTER — Telehealth: Payer: Self-pay | Admitting: *Deleted

## 2018-01-07 NOTE — Telephone Encounter (Signed)
Tried to call patient back no answer.  

## 2018-01-07 NOTE — Telephone Encounter (Signed)
Patient called and is scheduled on 01/26/18 to see Dr.Pabon for gallbladder. Patient stated he is in pain and wanted to know what he could do to help with the pain until he comes in for his office visit.

## 2018-01-07 NOTE — Telephone Encounter (Signed)
Patient to see Dr. Everlene FarrierPabon on 01/10/2018. If patient is in that much pain advised to go to ER.  The patient is aware to use a heating pad as needed for comfort.He states he don't want to use a strong pain medication.

## 2018-01-10 ENCOUNTER — Other Ambulatory Visit
Admission: RE | Admit: 2018-01-10 | Discharge: 2018-01-10 | Disposition: A | Discharge status: Home / Self Care | Payer: Self-pay | Source: Ambulatory Visit | Attending: Surgery | Admitting: Surgery

## 2018-01-10 ENCOUNTER — Ambulatory Visit (INDEPENDENT_AMBULATORY_CARE_PROVIDER_SITE_OTHER): Payer: Self-pay | Admitting: Surgery

## 2018-01-10 ENCOUNTER — Encounter: Payer: Self-pay | Admitting: Surgery

## 2018-01-10 VITALS — BP 129/84 | HR 56 | Temp 97.3°F | Ht 69.0 in | Wt 198.0 lb

## 2018-01-10 DIAGNOSIS — G8929 Other chronic pain: Secondary | ICD-10-CM | POA: Insufficient documentation

## 2018-01-10 DIAGNOSIS — R1011 Right upper quadrant pain: Secondary | ICD-10-CM | POA: Insufficient documentation

## 2018-01-10 LAB — COMPREHENSIVE METABOLIC PANEL
ALK PHOS: 43 U/L (ref 38–126)
ALT: 42 U/L (ref 0–44)
AST: 29 U/L (ref 15–41)
Albumin: 4.3 g/dL (ref 3.5–5.0)
Anion gap: 9 (ref 5–15)
BUN: 16 mg/dL (ref 6–20)
CALCIUM: 8.9 mg/dL (ref 8.9–10.3)
CO2: 23 mmol/L (ref 22–32)
Chloride: 106 mmol/L (ref 98–111)
Creatinine, Ser: 1.12 mg/dL (ref 0.61–1.24)
GFR calc Af Amer: >60 mL/min (ref >60)
Glucose, Bld: 90 mg/dL (ref 70–99)
POTASSIUM: 4.1 mmol/L (ref 3.5–5.1)
Sodium: 138 mmol/L (ref 135–145)
Total Bilirubin: 0.9 mg/dL (ref 0.3–1.2)
Total Protein: 6.9 g/dL (ref 6.5–8.1)

## 2018-01-10 LAB — CBC WITH DIFFERENTIAL/PLATELET
BASOS ABS: 0.1 10*3/uL (ref 0–0.1)
Basophils Relative: 1 %
Eosinophils Absolute: 0.4 10*3/uL (ref 0–0.7)
Eosinophils Relative: 7 %
HEMATOCRIT: 40.9 % (ref 40.0–52.0)
Hemoglobin: 14.1 g/dL (ref 13.0–18.0)
LYMPHS PCT: 27 %
Lymphs Abs: 1.5 10*3/uL (ref 1.0–3.6)
MCH: 33.1 pg (ref 26.0–34.0)
MCHC: 34.5 g/dL (ref 32.0–36.0)
MCV: 95.9 fL (ref 80.0–100.0)
MONO ABS: 0.4 10*3/uL (ref 0.2–1.0)
MONOS PCT: 7 %
NEUTROS ABS: 3.2 10*3/uL (ref 1.4–6.5)
Neutrophils Relative %: 58 %
Platelets: 342 10*3/uL (ref 150–440)
RBC: 4.26 MIL/uL — ABNORMAL LOW (ref 4.40–5.90)
RDW: 13.7 % (ref 11.5–14.5)
WBC: 5.5 10*3/uL (ref 3.8–10.6)

## 2018-01-10 LAB — LIPASE, BLOOD: LIPASE: 37 U/L (ref 11–51)

## 2018-01-10 LAB — TRIGLYCERIDES: Triglycerides: 164 mg/dL — ABNORMAL HIGH (ref <150)

## 2018-01-10 NOTE — Patient Instructions (Addendum)
You will need to have blood work done today. Please go to the [facility]. Your CT scan scheduled for 01/11/18 @ [facility] @ 8:45 am . Trevor QuinYou will need to pick up your prep today. Please do not eat/drink 4 hours prior to having the scan.  Please see your follow up appointment listed below.

## 2018-01-11 ENCOUNTER — Ambulatory Visit
Admission: RE | Admit: 2018-01-11 | Discharge: 2018-01-11 | Disposition: A | Discharge status: Home / Self Care | Payer: Self-pay | Source: Ambulatory Visit | Attending: Surgery | Admitting: Surgery

## 2018-01-11 ENCOUNTER — Ambulatory Visit: Admission: RE | Admit: 2018-01-11 | Payer: Self-pay | Source: Ambulatory Visit

## 2018-01-11 ENCOUNTER — Telehealth: Payer: Self-pay

## 2018-01-11 DIAGNOSIS — R1011 Right upper quadrant pain: Secondary | ICD-10-CM | POA: Insufficient documentation

## 2018-01-11 MED ORDER — IOPAMIDOL (ISOVUE-300) INJECTION 61%
100.0000 mL | Freq: Once | INTRAVENOUS | Status: AC | PRN
  Administered 2018-01-11: 100 mL via INTRAVENOUS

## 2018-01-11 NOTE — Telephone Encounter (Signed)
Spoke with jennifer received verbal CT scan results.

## 2018-01-12 ENCOUNTER — Other Ambulatory Visit: Payer: Self-pay

## 2018-01-12 ENCOUNTER — Encounter: Payer: Self-pay | Admitting: Surgery

## 2018-01-12 ENCOUNTER — Telehealth: Payer: Self-pay

## 2018-01-12 ENCOUNTER — Encounter
Admission: RE | Admit: 2018-01-12 | Discharge: 2018-01-12 | Disposition: A | Discharge status: Home / Self Care | Payer: Self-pay | Source: Ambulatory Visit | Attending: Surgery | Admitting: Surgery

## 2018-01-12 ENCOUNTER — Ambulatory Visit (INDEPENDENT_AMBULATORY_CARE_PROVIDER_SITE_OTHER): Payer: Self-pay | Admitting: Surgery

## 2018-01-12 VITALS — BP 128/71 | HR 71 | Temp 97.5°F | Resp 11 | Ht 69.0 in | Wt 184.0 lb

## 2018-01-12 DIAGNOSIS — R1011 Right upper quadrant pain: Secondary | ICD-10-CM

## 2018-01-12 DIAGNOSIS — K824 Cholesterolosis of gallbladder: Secondary | ICD-10-CM

## 2018-01-12 MED ORDER — GABAPENTIN 300 MG PO CAPS: 300.0000 mg | ORAL_CAPSULE | Freq: Three times a day (TID) | ORAL | 0 refills | Status: DC

## 2018-01-12 NOTE — Progress Notes (Signed)
Patient ID: Trevor Torres, male   DOB: 10/17/1970, 47 y.o.   MRN: [id number]  HPI Trevor Torres is a 47 y.o. male is here for evaluation after recently seen in the emergency room for abdominal pain.  He states that he has had this pain for a couple of  weeks is located in the epigastric area and right upper quadrant.  Pain is moderate to severe intensity and there is no specific alleviating or aggravating factors.  Does report nausea and previous episode of vomiting.  No evidence of biliary obstruction no evidence of jaundice or cholangitis.  No fevers no chills. Ultrasound personally reviewed showing evidence of gallbladder polyps there is no evidence of gallstones normal common bile duct.  Is able to perform more than 4 METS of activity without any shortness of breath or chest pain.  White count as well as a CBC and CMP were normal.  He did have a history apparently of pancreatitis several years ago and they told her it was related to triglycerides.  Reports episode of intermittent diarrhea  HPI  Past Medical History:  Diagnosis Date  . Anxiety   . Cellulitis 01-2015   left arm  . GERD (gastroesophageal reflux disease)    H/O PRIOR TO WEIGHT LOSS  . Headache   . HTN (hypertension) 01/07/2015   H/O/ GASTRIC SLEEVE /WT LOSS  . Hypothyroidism   . MRSA infection    h/o years ago  . Sleep apnea    H/O PRIOR TO WEIGHT LOSS    Past Surgical History:  Procedure Laterality Date  . COLONOSCOPY    . ESOPHAGOGASTRODUODENOSCOPY    . LAPAROSCOPIC GASTRIC SLEEVE RESECTION    . SHOULDER ARTHROSCOPY WITH OPEN ROTATOR CUFF REPAIR Right 02/06/2015   Procedure: SHOULDER ARTHROSCOPY WITH OPEN ROTATOR CUFF REPAIR;  Surgeon: Juanell Fairly, MD;  Location: [facility] ORS;  Service: Orthopedics;  Laterality: Right;    Family History  Problem Relation Age of Onset  . Diabetes Mother   . Hypertension Mother   . Cancer Mother   . Cancer Father     Social History Social History   Tobacco Use  . Smoking  status: Never Smoker  . Smokeless tobacco: Never Used  Substance Use Topics  . Alcohol use: Yes    Comment: socially  . Drug use: No    Allergies  Allergen Reactions  . Nsaids Other (See Comments)    Pt is unable to take due to gastric sleeve surgery.    Current Outpatient Medications  Medication Sig Dispense Refill  . ondansetron (ZOFRAN ODT) 4 MG disintegrating tablet Take 1 tablet (4 mg total) by mouth every 8 (eight) hours as needed for nausea or vomiting. 20 tablet 0   No current facility-administered medications for this visit.      Review of Systems Full ROS  was asked and was negative except for the information on the HPI  Physical Exam Blood pressure 129/84, pulse (!) 56, temperature (!) 97.3 F (36.3 C), temperature source Temporal, height 5\' 9"  (1.753 m), weight 198 lb (89.8 kg). CONSTITUTIONAL: NAD EYES: Pupils are equal, round, and reactive to light, Sclera are non-icteric. EARS, NOSE, MOUTH AND THROAT: The oropharynx is clear. The oral mucosa is pink and moist. Hearing is intact to voice. LYMPH NODES:  Lymph nodes in the neck are normal. RESPIRATORY:  Lungs are clear. There is normal respiratory effort, with equal breath sounds bilaterally, and without pathologic use of accessory muscles. CARDIOVASCULAR: Heart is regular without murmurs, gallops, or  rubs. GI: The abdomen is soft, nondistended to palpation right upper quadrant.  No evidence of peritonitis no evidence of Murphy sign.  GU: Rectal deferred.   MUSCULOSKELETAL: Normal muscle strength and tone. No cyanosis or edema.   SKIN: Turgor is good and there are no pathologic skin lesions or ulcers. NEUROLOGIC: Motor and sensation is grossly normal. Cranial nerves are grossly intact. PSYCH:  Oriented to person, place and time. Affect is normal.  Data Reviewed  I have personally reviewed the patient's imaging, laboratory findings and medical records.    Assessment/Plan [age] year old male with right upper  quadrant pain and ultrasound consistent with gallbladder polyps.  Because of the severity of the pain and the previous episode of pancreatitis will obtain a CT scan of the abdomen and pelvis to rule out any pancreatic pathology.  This will obviously take president before proceeding to the operating room for laparoscopic cholecystectomy.  I do think that his polyps from the gallbladder symptomatic and at some point he will require a cholecystectomy but first order business is to make sure there is nothing else acute within his abdomen.  Duration was explained to the patient detail and he agreed WITH THE PLAN.  Trevor Bigiego Violeta Lecount, MD FACS General Surgeon 01/12/2018, 3:29 PM

## 2018-01-12 NOTE — Progress Notes (Signed)
The patient is scheduled for surgery at [facility] with Dr Earlene Plateravis on 01/14/18. He will pre admit by phone. The patient was given financial assistance paperwork for [facility]. He is aware of date and instructions.

## 2018-01-12 NOTE — Patient Instructions (Signed)
Your procedure is scheduled on:01/14/18 Report to Day Surgery. [facility] SECOND FLOOR To find out your arrival time please call [phone] between 1PM - 3PM on 01/13/18  Remember: Instructions that are not followed completely may result in serious medical risk,  up to and including death, or upon the discretion of your surgeon and anesthesiologist your  surgery may need to be rescheduled.     _X__ 1. Do not eat food after midnight the night before your procedure.                 No gum chewing or hard candies. You may drink clear liquids up to 2 hours                 before you are scheduled to arrive for your surgery- DO not drink clear                 liquids within 2 hours of the start of your surgery.                 Clear Liquids include:  water, apple juice without pulp, clear carbohydrate                 drink such as Clearfast of Gatorade, Black Coffee or Tea (Do not add                 anything to coffee or tea).  __X__2.  On the morning of surgery brush your teeth with toothpaste and water, you                may rinse your mouth with mouthwash if you wish.  Do not swallow any toothpaste of mouthwash.     _X__ 3.  No Alcohol for 24 hours before or after surgery.   _X__ 4.  Do Not Smoke or use e-cigarettes For 24 Hours Prior to Your Surgery.                 Do not use any chewable tobacco products for at least 6 hours prior to                 surgery.  ____  5.  Bring all medications with you on the day of surgery if instructed.   _X_  6.  Notify your doctor if there is any change in your medical condition      (cold, fever, infections).     Do not wear jewelry, make-up, hairpins, clips or nail polish. Do not wear lotions, powders, or perfumes. You may wear deodorant. Do not shave 48 hours prior to surgery. Men may shave face and neck. Do not bring valuables to the hospital.    [facility] is not responsible for any belongings or  valuables.  Contacts, dentures or bridgework may not be worn into surgery. Leave your suitcase in the car. After surgery it may be brought to your room. For patients admitted to the hospital, discharge time is determined by your treatment team.   Patients discharged the day of surgery will not be allowed to drive home.      ____ Take these medicines the morning of surgery with A SIP OF WATER:    1. NONE  2.   3.   4.  5.  6.  ____ Fleet Enema (as directed)   ____ Use CHG Soap as directed  ____ Use inhalers on the day of surgery  ____ Stop metformin 2 days prior to surgery  ____ Take 1/2 of usual insulin dose the night before surgery. No insulin the morning          of surgery.   ____ Stop Coumadin/Plavix/aspirin on  ____ Stop Anti-inflammatories on   ____ Stop supplements until after surgery.    ____ Bring C-Pap to the hospital.

## 2018-01-12 NOTE — Progress Notes (Signed)
Outpatient Surgical Follow Up  01/12/2018  Trevor Torres is an 47 y.o. male.   Chief Complaint  Patient presents with  . Follow-up    discuss CT scan and labs    HPI: He was following up after 2 days ago I saw him for a symptomatic gallbladder polyp.  I have requested a CT scan which I have personally review showing no evidence of pancreatitis or any other acute intra-abdominal processes.  His labs were completely unremarkable.  He continues to have intermittent epigastric and right upper quadrant pain that radiates to his back.  The pain is intermittent worsening with meals.  He has associated diarrhea and nausea.  No fevers no chills no evidence of cholangitis or biliary obstruction. Showing polyps without evidence of cholelithiasis.  Normal common bile duct.  Past Medical History:  Diagnosis Date  . Anxiety   . Cellulitis 01-2015   left arm  . GERD (gastroesophageal reflux disease)   . Headache   . HTN (hypertension) 01/07/2015  . Hypothyroidism   . MRSA infection    h/o years ago  . Sleep apnea     Past Surgical History:  Procedure Laterality Date  . COLONOSCOPY    . ESOPHAGOGASTRODUODENOSCOPY    . LAPAROSCOPIC GASTRIC SLEEVE RESECTION    . SHOULDER ARTHROSCOPY WITH OPEN ROTATOR CUFF REPAIR Right 02/06/2015   Procedure: SHOULDER ARTHROSCOPY WITH OPEN ROTATOR CUFF REPAIR;  Surgeon: Thornton Park, MD;  Location: [facility] ORS;  Service: Orthopedics;  Laterality: Right;    Family History  Problem Relation Age of Onset  . Diabetes Mother   . Hypertension Mother   . Cancer Mother   . Cancer Father     Social History:  reports that he has never smoked. He has never used smokeless tobacco. He reports that he drinks alcohol. He reports that he does not use drugs.  Allergies:  Allergies  Allergen Reactions  . Nsaids Other (See Comments)    Pt is unable to take due to gastric sleeve surgery.    Medications reviewed.    ROS Full ROS performed and is otherwise negative  other than what is stated in HPI   BP 128/71   Pulse 71   Temp (!) 97.5 F (36.4 C) (Skin)   Resp 11   Ht '5\' 9"'$  (1.753 m)   Wt 184 lb (83.5 kg)   BMI 27.17 kg/m   Physical Exam  Constitutional: He appears well-developed and well-nourished.  Neck: Normal range of motion. No JVD present. No tracheal deviation present. No thyromegaly present.  Cardiovascular: Normal rate, regular rhythm and normal heart sounds.  Pulmonary/Chest: Effort normal and breath sounds normal.  Abdominal: Soft. He exhibits no distension and no mass. There is tenderness. There is no guarding.  TTP RUQ, no peritonitis  Musculoskeletal: Normal range of motion. He exhibits no edema.  Skin: Skin is warm and dry. Capillary refill takes less than 2 seconds.  Psychiatric: He has a normal mood and affect. His behavior is normal. Judgment and thought content normal.  Nursing note and vitals reviewed.    Results for orders placed or performed during the hospital encounter of 01/10/18 (from the past 48 hour(s))  CBC with Differential/Platelet     Status: Abnormal   Collection Time: 01/10/18 12:12 PM  Result Value Ref Range   WBC 5.5 3.8 - 10.6 K/uL   RBC 4.26 (L) 4.40 - 5.90 MIL/uL   Hemoglobin 14.1 13.0 - 18.0 g/dL   HCT 40.9 40.0 - 52.0 %  MCV 95.9 80.0 - 100.0 fL   MCH 33.1 26.0 - 34.0 pg   MCHC 34.5 32.0 - 36.0 g/dL   RDW 13.7 11.5 - 14.5 %   Platelets 342 150 - 440 K/uL   Neutrophils Relative % 58 %   Neutro Abs 3.2 1.4 - 6.5 K/uL   Lymphocytes Relative 27 %   Lymphs Abs 1.5 1.0 - 3.6 K/uL   Monocytes Relative 7 %   Monocytes Absolute 0.4 0.2 - 1.0 K/uL   Eosinophils Relative 7 %   Eosinophils Absolute 0.4 0 - 0.7 K/uL   Basophils Relative 1 %   Basophils Absolute 0.1 0 - 0.1 K/uL    Comment: Performed at [facility], [address] [place], [place] [address]  Comprehensive metabolic panel     Status: None   Collection Time: 01/10/18 12:12 PM  Result Value Ref Range   Sodium 138 135 -  145 mmol/L   Potassium 4.1 3.5 - 5.1 mmol/L   Chloride 106 98 - 111 mmol/L   CO2 23 22 - 32 mmol/L   Glucose, Bld 90 70 - 99 mg/dL   BUN 16 6 - 20 mg/dL   Creatinine, Ser 1.12 0.61 - 1.24 mg/dL   Calcium 8.9 8.9 - 10.3 mg/dL   Total Protein 6.9 6.5 - 8.1 g/dL   Albumin 4.3 3.5 - 5.0 g/dL   AST 29 15 - 41 U/L   ALT 42 0 - 44 U/L   Alkaline Phosphatase 43 38 - 126 U/L   Total Bilirubin 0.9 0.3 - 1.2 mg/dL   GFR calc non Af Amer >60 >60 mL/min   GFR calc Af Amer >60 >60 mL/min    Comment: (NOTE) The eGFR has been calculated using the CKD EPI equation. This calculation has not been validated in all clinical situations. eGFR's persistently <60 mL/min signify possible Chronic Kidney Disease.    Anion gap 9 5 - 15    Comment: Performed at [facility], [address] [place], [place] [address]  Lipase, blood     Status: None   Collection Time: 01/10/18 12:12 PM  Result Value Ref Range   Lipase 37 11 - 51 U/L    Comment: Performed at [facility], [address] [place], [place] [address]  Triglycerides     Status: Abnormal   Collection Time: 01/10/18 12:12 PM  Result Value Ref Range   Triglycerides 164 (H) <150 mg/dL    Comment: Performed at [facility], [address] [place],  [address]   Ct Abdomen Pelvis W Contrast  Result Date: 01/11/2018 CLINICAL DATA:  RIGHT upper quadrant pain for 2 weeks, chills, nausea, vomiting, diarrhea, back pain, history of gastric sleeve surgery 5 years ago, hypertension EXAM: CT ABDOMEN AND PELVIS WITH CONTRAST TECHNIQUE: Multidetector CT imaging of the abdomen and pelvis was performed using the standard protocol following bolus administration of intravenous contrast. Sagittal and coronal MPR images reconstructed from axial data set. CONTRAST:  133m ISOVUE-300 IOPAMIDOL (ISOVUE-300) INJECTION 61% IV. Dilute oral contrast. COMPARISON:  09/06/2014 FINDINGS: Lower chest: Calcified granuloma posterior LEFT lower lobe  base. Lung bases appear hyperinflated but otherwise clear. Hepatobiliary: Gallbladder and liver normal appearance. No biliary dilatation. Pancreas: Normal appearance Spleen: Normal appearance Adrenals/Urinary Tract: Adrenal glands, kidneys, ureters, and bladder normal appearance Stomach/Bowel: Normal appendix. Colon unopacified, with suboptimal assessment of wall thickness, difficult to exclude wall thickening at rectum. Prior gastric surgery. Bowel loops otherwise unremarkable. Vascular/Lymphatic: Aorta normal caliber.  No adenopathy.  Reproductive: Unremarkable prostate gland Other: No free air or free fluid. No hernia or acute inflammatory process. Musculoskeletal: Mild degenerative disc disease changes at L4-L5 and L5-S1. IMPRESSION: No definite acute intra-abdominal or intrapelvic abnormalities as above. Electronically Signed   By: Lavonia Dana M.D.   On: 01/11/2018 10:04    Assessment/Plan: 47 year old with right upper quadrant pain nausea and diarrhea consistent with symptomatic gallbladder polyp.  No evidence of any other acute intra-abdominal processes.  Discussed with the patient in detail and I do recommend cholecystectomy. The risks, benefits, complications, treatment options, and expected outcomes were discussed with the patient. The possibilities of bleeding, recurrent infection, finding a normal gallbladder, perforation of viscus organs, damage to surrounding structures, bile leak, abscess formation, needing a drain placed, the need for additional procedures, reaction to medication, pulmonary aspiration,  failure to diagnose a condition, the possible need to convert to an open procedure, and creating a complication requiring transfusion or operation were discussed with the patient. The patient and/or family concurred with the proposed plan, giving informed consent.  I will be out of town and he needs to have this performed in an expedited fashion I have asked my colleague Dr. Rosana Hoes and he will  perform his cholecystectomy this coming Friday. Caroleen Hamman, MD [facility] General Surgeon

## 2018-01-12 NOTE — Telephone Encounter (Signed)
Patient notified of Gabapentin prescription sent to pharmacy.  Patient asked if he could get the pain medication called to drug store to have on hand when he has his surgery. I told him the doctor will address that when he has his surgery.

## 2018-01-12 NOTE — H&P (View-Only) (Signed)
Outpatient Surgical Follow Up  01/12/2018  Trevor Torres is an 47 y.o. male.   Chief Complaint  Patient presents with  . Follow-up    discuss CT scan and labs    HPI: He was following up after 2 days ago I saw him for a symptomatic gallbladder polyp.  I have requested a CT scan which I have personally review showing no evidence of pancreatitis or any other acute intra-abdominal processes.  His labs were completely unremarkable.  He continues to have intermittent epigastric and right upper quadrant pain that radiates to his back.  The pain is intermittent worsening with meals.  He has associated diarrhea and nausea.  No fevers no chills no evidence of cholangitis or biliary obstruction. Showing polyps without evidence of cholelithiasis.  Normal common bile duct.  Past Medical History:  Diagnosis Date  . Anxiety   . Cellulitis 01-2015   left arm  . GERD (gastroesophageal reflux disease)   . Headache   . HTN (hypertension) 01/07/2015  . Hypothyroidism   . MRSA infection    h/o years ago  . Sleep apnea     Past Surgical History:  Procedure Laterality Date  . COLONOSCOPY    . ESOPHAGOGASTRODUODENOSCOPY    . LAPAROSCOPIC GASTRIC SLEEVE RESECTION    . SHOULDER ARTHROSCOPY WITH OPEN ROTATOR CUFF REPAIR Right 02/06/2015   Procedure: SHOULDER ARTHROSCOPY WITH OPEN ROTATOR CUFF REPAIR;  Surgeon: Thornton Park, MD;  Location: [facility] ORS;  Service: Orthopedics;  Laterality: Right;    Family History  Problem Relation Age of Onset  . Diabetes Mother   . Hypertension Mother   . Cancer Mother   . Cancer Father     Social History:  reports that he has never smoked. He has never used smokeless tobacco. He reports that he drinks alcohol. He reports that he does not use drugs.  Allergies:  Allergies  Allergen Reactions  . Nsaids Other (See Comments)    Pt is unable to take due to gastric sleeve surgery.    Medications reviewed.    ROS Full ROS performed and is otherwise negative  other than what is stated in HPI   BP 128/71   Pulse 71   Temp (!) 97.5 F (36.4 C) (Skin)   Resp 11   Ht '5\' 9"'$  (1.753 m)   Wt 184 lb (83.5 kg)   BMI 27.17 kg/m   Physical Exam  Constitutional: He appears well-developed and well-nourished.  Neck: Normal range of motion. No JVD present. No tracheal deviation present. No thyromegaly present.  Cardiovascular: Normal rate, regular rhythm and normal heart sounds.  Pulmonary/Chest: Effort normal and breath sounds normal.  Abdominal: Soft. He exhibits no distension and no mass. There is tenderness. There is no guarding.  TTP RUQ, no peritonitis  Musculoskeletal: Normal range of motion. He exhibits no edema.  Skin: Skin is warm and dry. Capillary refill takes less than 2 seconds.  Psychiatric: He has a normal mood and affect. His behavior is normal. Judgment and thought content normal.  Nursing note and vitals reviewed.    Results for orders placed or performed during the hospital encounter of 01/10/18 (from the past 48 hour(s))  CBC with Differential/Platelet     Status: Abnormal   Collection Time: 01/10/18 12:12 PM  Result Value Ref Range   WBC 5.5 3.8 - 10.6 K/uL   RBC 4.26 (L) 4.40 - 5.90 MIL/uL   Hemoglobin 14.1 13.0 - 18.0 g/dL   HCT 40.9 40.0 - 52.0 %  MCV 95.9 80.0 - 100.0 fL   MCH 33.1 26.0 - 34.0 pg   MCHC 34.5 32.0 - 36.0 g/dL   RDW 13.7 11.5 - 14.5 %   Platelets 342 150 - 440 K/uL   Neutrophils Relative % 58 %   Neutro Abs 3.2 1.4 - 6.5 K/uL   Lymphocytes Relative 27 %   Lymphs Abs 1.5 1.0 - 3.6 K/uL   Monocytes Relative 7 %   Monocytes Absolute 0.4 0.2 - 1.0 K/uL   Eosinophils Relative 7 %   Eosinophils Absolute 0.4 0 - 0.7 K/uL   Basophils Relative 1 %   Basophils Absolute 0.1 0 - 0.1 K/uL    Comment: Performed at [facility], [address] [place], [place] [address]  Comprehensive metabolic panel     Status: None   Collection Time: 01/10/18 12:12 PM  Result Value Ref Range   Sodium 138 135 -  145 mmol/L   Potassium 4.1 3.5 - 5.1 mmol/L   Chloride 106 98 - 111 mmol/L   CO2 23 22 - 32 mmol/L   Glucose, Bld 90 70 - 99 mg/dL   BUN 16 6 - 20 mg/dL   Creatinine, Ser 1.12 0.61 - 1.24 mg/dL   Calcium 8.9 8.9 - 10.3 mg/dL   Total Protein 6.9 6.5 - 8.1 g/dL   Albumin 4.3 3.5 - 5.0 g/dL   AST 29 15 - 41 U/L   ALT 42 0 - 44 U/L   Alkaline Phosphatase 43 38 - 126 U/L   Total Bilirubin 0.9 0.3 - 1.2 mg/dL   GFR calc non Af Amer >60 >60 mL/min   GFR calc Af Amer >60 >60 mL/min    Comment: (NOTE) The eGFR has been calculated using the CKD EPI equation. This calculation has not been validated in all clinical situations. eGFR's persistently <60 mL/min signify possible Chronic Kidney Disease.    Anion gap 9 5 - 15    Comment: Performed at [facility], [address] [place], [place] [address]  Lipase, blood     Status: None   Collection Time: 01/10/18 12:12 PM  Result Value Ref Range   Lipase 37 11 - 51 U/L    Comment: Performed at [facility], [address] [place], [place] [address]  Triglycerides     Status: Abnormal   Collection Time: 01/10/18 12:12 PM  Result Value Ref Range   Triglycerides 164 (H) <150 mg/dL    Comment: Performed at [facility], [address] [place], [place] [address]   Ct Abdomen Pelvis W Contrast  Result Date: 01/11/2018 CLINICAL DATA:  RIGHT upper quadrant pain for 2 weeks, chills, nausea, vomiting, diarrhea, back pain, history of gastric sleeve surgery 5 years ago, hypertension EXAM: CT ABDOMEN AND PELVIS WITH CONTRAST TECHNIQUE: Multidetector CT imaging of the abdomen and pelvis was performed using the standard protocol following bolus administration of intravenous contrast. Sagittal and coronal MPR images reconstructed from axial data set. CONTRAST:  161m ISOVUE-300 IOPAMIDOL (ISOVUE-300) INJECTION 61% IV. Dilute oral contrast. COMPARISON:  09/06/2014 FINDINGS: Lower chest: Calcified granuloma posterior LEFT lower lobe  base. Lung bases appear hyperinflated but otherwise clear. Hepatobiliary: Gallbladder and liver normal appearance. No biliary dilatation. Pancreas: Normal appearance Spleen: Normal appearance Adrenals/Urinary Tract: Adrenal glands, kidneys, ureters, and bladder normal appearance Stomach/Bowel: Normal appendix. Colon unopacified, with suboptimal assessment of wall thickness, difficult to exclude wall thickening at rectum. Prior gastric surgery. Bowel loops otherwise unremarkable. Vascular/Lymphatic: Aorta normal caliber.  No adenopathy.  Reproductive: Unremarkable prostate gland Other: No free air or free fluid. No hernia or acute inflammatory process. Musculoskeletal: Mild degenerative disc disease changes at L4-L5 and L5-S1. IMPRESSION: No definite acute intra-abdominal or intrapelvic abnormalities as above. Electronically Signed   By: Lavonia Dana M.D.   On: 01/11/2018 10:04    Assessment/Plan: 47 year old with right upper quadrant pain nausea and diarrhea consistent with symptomatic gallbladder polyp.  No evidence of any other acute intra-abdominal processes.  Discussed with the patient in detail and I do recommend cholecystectomy. The risks, benefits, complications, treatment options, and expected outcomes were discussed with the patient. The possibilities of bleeding, recurrent infection, finding a normal gallbladder, perforation of viscus organs, damage to surrounding structures, bile leak, abscess formation, needing a drain placed, the need for additional procedures, reaction to medication, pulmonary aspiration,  failure to diagnose a condition, the possible need to convert to an open procedure, and creating a complication requiring transfusion or operation were discussed with the patient. The patient and/or family concurred with the proposed plan, giving informed consent.  I will be out of town and he needs to have this performed in an expedited fashion I have asked my colleague Dr. Rosana Hoes and he will  perform his cholecystectomy this coming Friday. Caroleen Hamman, MD [facility] General Surgeon

## 2018-01-12 NOTE — Patient Instructions (Addendum)
The patient is aware to call back for any questions or concerns. No lifting over 20 pounds for 6 weeks, out of work for 6 weeks due to lifting restriction. Return to work 02-28-18.  Schedule for Friday Dr Earlene Plateravis  Laparoscopic Cholecystectomy Laparoscopic cholecystectomy is surgery to remove the gallbladder. The gallbladder is a pear-shaped organ that lies beneath the liver on the right side of the body. The gallbladder stores bile, which is a fluid that helps the body to digest fats. Cholecystectomy is often done for inflammation of the gallbladder (cholecystitis). This condition is usually caused by a buildup of gallstones (cholelithiasis) in the gallbladder. Gallstones can block the flow of bile, which can result in inflammation and pain. In severe cases, emergency surgery may be required. This procedure is done though small incisions in your abdomen (laparoscopic surgery). A thin scope with a camera (laparoscope) is inserted through one incision. Thin surgical instruments are inserted through the other incisions. In some cases, a laparoscopic procedure may be turned into a type of surgery that is done through a larger incision (open surgery). Tell a health care provider about:  Any allergies you have.  All medicines you are taking, including vitamins, herbs, eye drops, creams, and over-the-counter medicines.  Any problems you or family members have had with anesthetic medicines.  Any blood disorders you have.  Any surgeries you have had.  Any medical conditions you have.  Whether you are pregnant or may be pregnant. What are the risks? Generally, this is a safe procedure. However, problems may occur, including:  Infection.  Bleeding.  Allergic reactions to medicines.  Damage to other structures or organs.  A stone remaining in the common bile duct. The common bile duct carries bile from the gallbladder into the small intestine.  A bile leak from the cyst duct that is clipped when  your gallbladder is removed.  What happens before the procedure? Staying hydrated Follow instructions from your health care provider about hydration, which may include:  Up to 2 hours before the procedure - you may continue to drink clear liquids, such as water, clear fruit juice, black coffee, and plain tea.  Eating and drinking restrictions Follow instructions from your health care provider about eating and drinking, which may include:  8 hours before the procedure - stop eating heavy meals or foods such as meat, fried foods, or fatty foods.  6 hours before the procedure - stop eating light meals or foods, such as toast or cereal.  6 hours before the procedure - stop drinking milk or drinks that contain milk.  2 hours before the procedure - stop drinking clear liquids.  Medicines  Ask your health care provider about: ? Changing or stopping your regular medicines. This is especially important if you are taking diabetes medicines or blood thinners. ? Taking medicines such as aspirin and ibuprofen. These medicines can thin your blood. Do not take these medicines before your procedure if your health care provider instructs you not to.  You may be given antibiotic medicine to help prevent infection. General instructions  Let your health care provider know if you develop a cold or an infection before surgery.  Plan to have someone take you home from the hospital or clinic.  Ask your health care provider how your surgical site will be marked or identified. What happens during the procedure?  To reduce your risk of infection: ? Your health care team will wash or sanitize their hands. ? Your skin will be washed with  soap. ? Hair may be removed from the surgical area.  An IV tube may be inserted into one of your veins.  You will be given one or more of the following: ? A medicine to help you relax (sedative). ? A medicine to make you fall asleep (general anesthetic).  A  breathing tube will be placed in your mouth.  Your surgeon will make several small cuts (incisions) in your abdomen.  The laparoscope will be inserted through one of the small incisions. The camera on the laparoscope will send images to a TV screen (monitor) in the operating room. This lets your surgeon see inside your abdomen.  Air-like gas will be pumped into your abdomen. This will expand your abdomen to give the surgeon more room to perform the surgery.  Other tools that are needed for the procedure will be inserted through the other incisions. The gallbladder will be removed through one of the incisions.  Your common bile duct may be examined. If stones are found in the common bile duct, they may be removed.  After your gallbladder has been removed, the incisions will be closed with stitches (sutures), staples, or skin glue.  Your incisions may be covered with a bandage (dressing). The procedure may vary among health care providers and hospitals. What happens after the procedure?  Your blood pressure, heart rate, breathing rate, and blood oxygen level will be monitored until the medicines you were given have worn off.  You will be given medicines as needed to control your pain.  Do not drive for 24 hours if you were given a sedative. This information is not intended to replace advice given to you by your health care provider. Make sure you discuss any questions you have with your health care provider. Document Released: 05/04/2005 Document Revised: 11/24/2015 Document Reviewed: 10/21/2015 Elsevier Interactive Patient Education  2018 [employer].

## 2018-01-12 NOTE — Telephone Encounter (Signed)
Patient will be unable to pick up his prescription pain medication after his surgery on 01/14/18. He would like what ever is going to be prescribed for him to be sent in before hand so he can pick it up prior to surgery. He would also like a little something prior to surgery to help. He said that the Tramadol did work well for him.

## 2018-01-13 MED ORDER — CEFAZOLIN SODIUM-DEXTROSE 2-4 GM/100ML-% IV SOLN
2.0000 g | INTRAVENOUS | Status: AC
  Administered 2018-01-14: 2 g via INTRAVENOUS

## 2018-01-14 ENCOUNTER — Other Ambulatory Visit: Payer: Self-pay

## 2018-01-14 ENCOUNTER — Ambulatory Visit: Payer: Self-pay

## 2018-01-14 ENCOUNTER — Ambulatory Visit: Payer: Self-pay | Admitting: Anesthesiology

## 2018-01-14 ENCOUNTER — Encounter
Admission: RE | Disposition: A | Discharge status: Home / Self Care | Payer: Self-pay | Source: Ambulatory Visit | Attending: Surgery

## 2018-01-14 ENCOUNTER — Ambulatory Visit
Admission: RE | Admit: 2018-01-14 | Discharge: 2018-01-14 | Disposition: A | Discharge status: Home / Self Care | Payer: Self-pay | Source: Ambulatory Visit | Attending: Surgery | Admitting: Surgery

## 2018-01-14 ENCOUNTER — Encounter: Payer: Self-pay | Admitting: Anesthesiology

## 2018-01-14 DIAGNOSIS — K219 Gastro-esophageal reflux disease without esophagitis: Secondary | ICD-10-CM | POA: Insufficient documentation

## 2018-01-14 DIAGNOSIS — Z79899 Other long term (current) drug therapy: Secondary | ICD-10-CM | POA: Insufficient documentation

## 2018-01-14 DIAGNOSIS — K8020 Calculus of gallbladder without cholecystitis without obstruction: Secondary | ICD-10-CM

## 2018-01-14 DIAGNOSIS — R1011 Right upper quadrant pain: Secondary | ICD-10-CM

## 2018-01-14 DIAGNOSIS — E039 Hypothyroidism, unspecified: Secondary | ICD-10-CM | POA: Insufficient documentation

## 2018-01-14 DIAGNOSIS — I10 Essential (primary) hypertension: Secondary | ICD-10-CM | POA: Insufficient documentation

## 2018-01-14 DIAGNOSIS — K8044 Calculus of bile duct with chronic cholecystitis without obstruction: Secondary | ICD-10-CM | POA: Insufficient documentation

## 2018-01-14 DIAGNOSIS — G4730 Sleep apnea, unspecified: Secondary | ICD-10-CM | POA: Insufficient documentation

## 2018-01-14 DIAGNOSIS — F419 Anxiety disorder, unspecified: Secondary | ICD-10-CM | POA: Insufficient documentation

## 2018-01-14 HISTORY — DX: Disorder of kidney and ureter, unspecified: N28.9

## 2018-01-14 HISTORY — PX: CHOLECYSTECTOMY: SHX55

## 2018-01-14 SURGERY — LAPAROSCOPIC CHOLECYSTECTOMY
Anesthesia: General | Wound class: Clean Contaminated

## 2018-01-14 MED ORDER — GLYCOPYRROLATE 0.2 MG/ML IJ SOLN
INTRAMUSCULAR | Status: DC | PRN
  Administered 2018-01-14: 0.2 mg via INTRAVENOUS

## 2018-01-14 MED ORDER — ACETAMINOPHEN 10 MG/ML IV SOLN
INTRAVENOUS | Status: DC | PRN
  Administered 2018-01-14: 1000 mg via INTRAVENOUS

## 2018-01-14 MED ORDER — FENTANYL CITRATE (PF) 100 MCG/2ML IJ SOLN
INTRAMUSCULAR | Status: AC
  Filled 2018-01-14: qty 2

## 2018-01-14 MED ORDER — LIDOCAINE HCL 1 % IJ SOLN
INTRAMUSCULAR | Status: DC | PRN
  Administered 2018-01-14: 20 mL

## 2018-01-14 MED ORDER — CHLORHEXIDINE GLUCONATE CLOTH 2 % EX PADS: 6.0000 | MEDICATED_PAD | Freq: Once | CUTANEOUS | Status: DC

## 2018-01-14 MED ORDER — FENTANYL CITRATE (PF) 100 MCG/2ML IJ SOLN
25.0000 ug | INTRAMUSCULAR | Status: DC | PRN
  Administered 2018-01-14 (×3): 25 ug via INTRAVENOUS

## 2018-01-14 MED ORDER — LIDOCAINE HCL (PF) 1 % IJ SOLN
INTRAMUSCULAR | Status: AC
  Filled 2018-01-14: qty 30

## 2018-01-14 MED ORDER — PROPOFOL 10 MG/ML IV BOLUS
INTRAVENOUS | Status: AC
  Filled 2018-01-14: qty 40

## 2018-01-14 MED ORDER — SUGAMMADEX SODIUM 200 MG/2ML IV SOLN
INTRAVENOUS | Status: DC | PRN
  Administered 2018-01-14: 167 mg via INTRAVENOUS

## 2018-01-14 MED ORDER — LIDOCAINE HCL (PF) 2 % IJ SOLN
INTRAMUSCULAR | Status: AC
  Filled 2018-01-14: qty 10

## 2018-01-14 MED ORDER — FAMOTIDINE 20 MG PO TABS
ORAL_TABLET | ORAL | Status: AC
  Filled 2018-01-14: qty 1

## 2018-01-14 MED ORDER — ROCURONIUM BROMIDE 100 MG/10ML IV SOLN
INTRAVENOUS | Status: DC | PRN
  Administered 2018-01-14: 10 mg via INTRAVENOUS
  Administered 2018-01-14: 5 mg via INTRAVENOUS
  Administered 2018-01-14: 35 mg via INTRAVENOUS
  Administered 2018-01-14: 5 mg via INTRAVENOUS

## 2018-01-14 MED ORDER — ONDANSETRON HCL 4 MG/2ML IJ SOLN
INTRAMUSCULAR | Status: DC | PRN
  Administered 2018-01-14: 4 mg via INTRAVENOUS

## 2018-01-14 MED ORDER — LIDOCAINE HCL (CARDIAC) PF 100 MG/5ML IV SOSY
PREFILLED_SYRINGE | INTRAVENOUS | Status: DC | PRN
  Administered 2018-01-14: 100 mg via INTRAVENOUS

## 2018-01-14 MED ORDER — SUGAMMADEX SODIUM 200 MG/2ML IV SOLN
INTRAVENOUS | Status: AC
  Filled 2018-01-14: qty 2

## 2018-01-14 MED ORDER — MIDAZOLAM HCL 2 MG/2ML IJ SOLN
INTRAMUSCULAR | Status: DC | PRN
  Administered 2018-01-14: 2 mg via INTRAVENOUS

## 2018-01-14 MED ORDER — FAMOTIDINE 20 MG PO TABS: 20.0000 mg | ORAL_TABLET | Freq: Once | ORAL | Status: DC

## 2018-01-14 MED ORDER — FENTANYL CITRATE (PF) 100 MCG/2ML IJ SOLN
INTRAMUSCULAR | Status: AC
  Administered 2018-01-14: 25 ug via INTRAVENOUS
  Filled 2018-01-14: qty 2

## 2018-01-14 MED ORDER — DEXAMETHASONE SODIUM PHOSPHATE 10 MG/ML IJ SOLN
INTRAMUSCULAR | Status: AC
  Filled 2018-01-14: qty 1

## 2018-01-14 MED ORDER — OXYCODONE-ACETAMINOPHEN 5-325 MG PO TABS: 1.0000 | ORAL_TABLET | ORAL | 0 refills | Status: DC | PRN

## 2018-01-14 MED ORDER — LACTATED RINGERS IV SOLN
INTRAVENOUS | Status: DC
  Administered 2018-01-14 (×2): via INTRAVENOUS

## 2018-01-14 MED ORDER — ROCURONIUM BROMIDE 50 MG/5ML IV SOLN
INTRAVENOUS | Status: AC
  Filled 2018-01-14: qty 1

## 2018-01-14 MED ORDER — DEXAMETHASONE SODIUM PHOSPHATE 10 MG/ML IJ SOLN
INTRAMUSCULAR | Status: DC | PRN
  Administered 2018-01-14: 10 mg via INTRAVENOUS

## 2018-01-14 MED ORDER — MIDAZOLAM HCL 2 MG/2ML IJ SOLN
INTRAMUSCULAR | Status: AC
  Filled 2018-01-14: qty 2

## 2018-01-14 MED ORDER — PROPOFOL 10 MG/ML IV BOLUS
INTRAVENOUS | Status: DC | PRN
  Administered 2018-01-14: 200 mg via INTRAVENOUS

## 2018-01-14 MED ORDER — ONDANSETRON HCL 4 MG/2ML IJ SOLN: 4.0000 mg | Freq: Once | INTRAMUSCULAR | Status: DC | PRN

## 2018-01-14 MED ORDER — CEFAZOLIN SODIUM-DEXTROSE 2-4 GM/100ML-% IV SOLN
INTRAVENOUS | Status: AC
  Filled 2018-01-14: qty 100

## 2018-01-14 MED ORDER — MIDAZOLAM HCL 2 MG/2ML IJ SOLN
INTRAMUSCULAR | Status: AC
  Administered 2018-01-14: 1 mg via INTRAVENOUS
  Filled 2018-01-14: qty 2

## 2018-01-14 MED ORDER — FENTANYL CITRATE (PF) 100 MCG/2ML IJ SOLN
INTRAMUSCULAR | Status: DC | PRN
  Administered 2018-01-14: 100 ug via INTRAVENOUS
  Administered 2018-01-14: 25 ug via INTRAVENOUS
  Administered 2018-01-14: 50 ug via INTRAVENOUS

## 2018-01-14 MED ORDER — BUPIVACAINE HCL (PF) 0.5 % IJ SOLN
INTRAMUSCULAR | Status: AC
  Filled 2018-01-14: qty 30

## 2018-01-14 MED ORDER — ONDANSETRON HCL 4 MG/2ML IJ SOLN
INTRAMUSCULAR | Status: AC
  Filled 2018-01-14: qty 2

## 2018-01-14 MED ORDER — ACETAMINOPHEN 10 MG/ML IV SOLN
INTRAVENOUS | Status: AC
  Filled 2018-01-14: qty 100

## 2018-01-14 MED ORDER — SUCCINYLCHOLINE CHLORIDE 20 MG/ML IJ SOLN
INTRAMUSCULAR | Status: DC | PRN
  Administered 2018-01-14: 120 mg via INTRAVENOUS

## 2018-01-14 MED ORDER — MIDAZOLAM HCL 2 MG/2ML IJ SOLN
1.0000 mg | Freq: Once | INTRAMUSCULAR | Status: AC
  Administered 2018-01-14: 1 mg via INTRAVENOUS

## 2018-01-14 SURGICAL SUPPLY — 38 items
ADH SKN CLS APL DERMABOND .7 (GAUZE/BANDAGES/DRESSINGS) ×2
APPLIER CLIP ROT 10 11.4 M/L (STAPLE) ×2
APR CLP MED LRG 11.4X10 (STAPLE) ×1
BAG SPEC RTRVL LRG 6X4 10 (ENDOMECHANICALS) ×1
CHLORAPREP W/TINT 26ML (MISCELLANEOUS) ×2 IMPLANT
CLIP APPLIE ROT 10 11.4 M/L (STAPLE) ×1 IMPLANT
DECANTER SPIKE VIAL GLASS SM (MISCELLANEOUS) ×2 IMPLANT
DERMABOND ADVANCED (GAUZE/BANDAGES/DRESSINGS) ×2
DERMABOND ADVANCED .7 DNX12 (GAUZE/BANDAGES/DRESSINGS) ×1 IMPLANT
DRESSING SURGICEL FIBRLLR 1X2 (HEMOSTASIS) IMPLANT
DRSG SURGICEL FIBRILLAR 1X2 (HEMOSTASIS)
ELECT REM PT RETURN 9FT ADLT (ELECTROSURGICAL) ×2
ELECTRODE REM PT RTRN 9FT ADLT (ELECTROSURGICAL) ×1 IMPLANT
GLOVE BIO SURGEON STRL SZ7 (GLOVE) ×3 IMPLANT
GLOVE BIOGEL PI IND STRL 7.5 (GLOVE) ×1 IMPLANT
GLOVE BIOGEL PI INDICATOR 7.5 (GLOVE) ×2
GOWN STRL REUS W/ TWL LRG LVL3 (GOWN DISPOSABLE) ×3 IMPLANT
GOWN STRL REUS W/TWL LRG LVL3 (GOWN DISPOSABLE) ×6
GRASPER SUT TROCAR 14GX15 (MISCELLANEOUS) ×2 IMPLANT
IRRIGATION STRYKERFLOW (MISCELLANEOUS) IMPLANT
IRRIGATOR STRYKERFLOW (MISCELLANEOUS)
IV NS 1000ML (IV SOLUTION) ×2
IV NS 1000ML BAXH (IV SOLUTION) ×1 IMPLANT
KIT TURNOVER KIT A (KITS) ×2 IMPLANT
NDL INSUFFLATION 14GA 120MM (NEEDLE) ×1 IMPLANT
NEEDLE HYPO 22GX1.5 SAFETY (NEEDLE) ×3 IMPLANT
NEEDLE INSUFFLATION 14GA 120MM (NEEDLE) ×2 IMPLANT
NS IRRIG 1000ML POUR BTL (IV SOLUTION) ×2 IMPLANT
PACK LAP CHOLECYSTECTOMY (MISCELLANEOUS) ×2 IMPLANT
POUCH SPECIMEN RETRIEVAL 10MM (ENDOMECHANICALS) ×2 IMPLANT
SCISSORS METZENBAUM CVD 33 (INSTRUMENTS) ×1 IMPLANT
SLEEVE ENDOPATH XCEL 5M (ENDOMECHANICALS) ×4 IMPLANT
SUT MNCRL AB 4-0 PS2 18 (SUTURE) ×2 IMPLANT
SUT VICRYL 0 UR6 27IN ABS (SUTURE) ×2 IMPLANT
SUT VICRYL AB 3-0 FS1 BRD 27IN (SUTURE) ×2 IMPLANT
TROCAR XCEL NON-BLD 11X100MML (ENDOMECHANICALS) ×2 IMPLANT
TROCAR XCEL NON-BLD 5MMX100MML (ENDOMECHANICALS) ×2 IMPLANT
TUBING INSUFFLATION (TUBING) ×2 IMPLANT

## 2018-01-14 NOTE — Anesthesia Postprocedure Evaluation (Signed)
Anesthesia Post Note  Patient: Trevor Torres  Procedure(s) Performed: LAPAROSCOPIC CHOLECYSTECTOMY (N/A )  Patient location during evaluation: PACU Anesthesia Type: General Level of consciousness: awake and alert Pain management: pain level controlled Vital Signs Assessment: post-procedure vital signs reviewed and stable Respiratory status: spontaneous breathing and respiratory function stable Cardiovascular status: stable Anesthetic complications: no     Last Vitals:  Vitals:   01/14/18 1820 01/14/18 1825  BP:    Pulse: (!) 49 (!) 56  Resp: 14 16  Temp:    SpO2: 100% 99%    Last Pain:  Vitals:   01/14/18 1825  TempSrc:   PainSc: 4                  Lowana Hable K

## 2018-01-14 NOTE — Anesthesia Procedure Notes (Signed)
Procedure Name: Intubation Date/Time: 01/14/2018 3:58 PM Performed by: Ginger CarneMichelet, Shravan Salahuddin, CRNA Pre-anesthesia Checklist: Patient identified, Emergency Drugs available, Suction available, Patient being monitored and Timeout performed Patient Re-evaluated:Patient Re-evaluated prior to induction Oxygen Delivery Method: Circle system utilized Preoxygenation: Pre-oxygenation with 100% oxygen Induction Type: IV induction Ventilation: Mask ventilation without difficulty Laryngoscope Size: Miller and 2 Grade View: Grade I Tube type: Oral Tube size: 7.5 mm Number of attempts: 1 Airway Equipment and Method: Stylet Placement Confirmation: ETT inserted through vocal cords under direct vision,  positive ETCO2 and breath sounds checked- equal and bilateral Secured at: 23 cm Tube secured with: Tape Dental Injury: Teeth and Oropharynx as per pre-operative assessment

## 2018-01-14 NOTE — Interval H&P Note (Signed)
History and Physical Interval Note:  01/14/2018 2:18 PM  Trevor EmeryMatthew J Torres  has presented today for surgery, with the diagnosis of biliary colie,gallbladder polyp  The various methods of treatment have been discussed with the patient and family. After consideration of risks, benefits and other options for treatment, the patient has consented to  Procedure(s): LAPAROSCOPIC CHOLECYSTECTOMY (N/A) as a surgical intervention .  The patient's history has been reviewed, patient examined, no change in status, stable for surgery.  I have reviewed the patient's chart and labs.  Questions were answered to the patient's satisfaction.     Ancil LinseyJason Evan Marilee Ditommaso

## 2018-01-14 NOTE — Anesthesia Post-op Follow-up Note (Signed)
Anesthesia QCDR form completed.        

## 2018-01-14 NOTE — Anesthesia Preprocedure Evaluation (Signed)
Anesthesia Evaluation  Patient identified by MRN, date of birth, ID band Patient awake    Reviewed: Allergy & Precautions, NPO status , Patient's Chart, lab work & pertinent test results, reviewed documented beta blocker date and time   Airway Mallampati: III  TM Distance: >3 FB     Dental  (+) Chipped   Pulmonary sleep apnea ,           Cardiovascular hypertension, Pt. on medications      Neuro/Psych  Headaches, PSYCHIATRIC DISORDERS Anxiety Depression    GI/Hepatic GERD  Controlled,  Endo/Other  Hypothyroidism   Renal/GU      Musculoskeletal   Abdominal   Peds  Hematology   Anesthesia Other Findings S/p gastric sleeve.  Reproductive/Obstetrics                             Anesthesia Physical Anesthesia Plan  ASA: III  Anesthesia Plan: General   Post-op Pain Management:    Induction: Intravenous  PONV Risk Score and Plan:   Airway Management Planned: Oral ETT  Additional Equipment:   Intra-op Plan:   Post-operative Plan:   Informed Consent: I have reviewed the patients History and Physical, chart, labs and discussed the procedure including the risks, benefits and alternatives for the proposed anesthesia with the patient or authorized representative who has indicated his/her understanding and acceptance.     Plan Discussed with: CRNA  Anesthesia Plan Comments:         Anesthesia Quick Evaluation

## 2018-01-14 NOTE — Discharge Instructions (Addendum)
AMBULATORY SURGERY  DISCHARGE INSTRUCTIONS   1) The drugs that you were given will stay in your system until tomorrow so for the next 24 hours you should not:  A) Drive an automobile B) Make any legal decisions C) Drink any alcoholic beverage   2) You may resume regular meals tomorrow.  Today it is better to start with liquids and gradually work up to solid foods.  You may eat anything you prefer, but it is better to start with liquids, then soup and crackers, and gradually work up to solid foods.   3) Please notify your doctor immediately if you have any unusual bleeding, trouble breathing, redness and pain at the surgery site, drainage, fever, or pain not relieved by medication.    4) Additional Instructions:     Please contact your physician with any problems or Same Day Surgery at [phone], Monday through Friday 6 am to 4 pm, or Register at [facility] Main number at [phone].In addition to included general post-operative instructions for Laparoscopic Cholecystectomy,  Diet: Resume home heart healthy diet (may prefer to start with low fat foods to minimize initially loose BM's with fatty foods and advance as tolerated).   Activity: No heavy lifting >20 pounds (children, pets, laundry, garbage) or strenuous activity until follow-up, but light activity and walking are encouraged. Do not drive or drink alcohol if taking narcotic pain medications.  Wound care: 2 days after surgery (Sunday afternoon - Monday, 9/2), you may shower/get incision wet with soapy water and pat dry (do not rub incisions), but no baths or submerging incision underwater until follow-up.   Medications: Resume all home medications. For mild to moderate pain: acetaminophen (Tylenol) or ibuprofen/naproxen (if no kidney disease). Combining Tylenol with alcohol can substantially increase your risk of causing liver disease. Narcotic pain medications, if prescribed, can be used for severe pain, though may  cause nausea, constipation, and drowsiness. Do not combine Tylenol and Percocet (or similar) within a 6 hour period as Percocet (and similar) contain(s) Tylenol. If you do not need the narcotic pain medication, you do not need to fill the prescription.  Call office [phone]) at any time if any questions, worsening pain, fevers/chills, bleeding, drainage from incision site, or other concerns.

## 2018-01-14 NOTE — Op Note (Signed)
SURGICAL OPERATIVE REPORT   DATE OF PROCEDURE: 01/14/2018  ATTENDING Surgeon(s): Ancil Linseyavis, Jason Evan, MD  ANESTHESIA: GETA  PRE-OPERATIVE DIAGNOSIS: Symptomatic Cholelithiasis vs Gallbladder Polyp (K80.20)  POST-OPERATIVE DIAGNOSIS: Symptomatic Cholelithiasis vs Gallbladder Polyp (K80.20)  PROCEDURE(S): (cpt's: 47562) 1.) Laparoscopic Cholecystectomy  INTRAOPERATIVE FINDINGS: Minimal pericholecystic inflammation with cystic duct and cystic artery clips well-secured, hemostasis at completion of procedure  INTRAOPERATIVE FLUIDS: 1000 mL crystalloid   ESTIMATED BLOOD LOSS: 35 mL   URINE OUTPUT: No foley  SPECIMENS: Gallbladder  IMPLANTS: None  DRAINS: None   COMPLICATIONS: None apparent   CONDITION AT COMPLETION: Hemodynamically stable and extubated  DISPOSITION: PACU   INDICATION(S) FOR PROCEDURE:  Patient is a 47 y.o. male who recently presented with post-prandial RUQ > epigastric abdominal pain after eating fatty foods in particular. Ultrasound suggested cholelithiasis vs gallbladder polyp without sonographic evidence of cholecystitis. All risks, benefits, and alternatives to above elective procedures were discussed with the patient, who elected to proceed, and informed consent was accordingly obtained at that time.   DETAILS OF PROCEDURE:  Patient was brought to the operating suite and appropriately identified. General anesthesia was administered along with peri-operative prophylactic IV antibiotics, and endotracheal intubation was performed by anesthesiologist, along with NG/OG tube for gastric decompression. In supine position, operative site was prepped and draped in usual sterile fashion, and following a brief time out, initial 5 mm incision was made in a natural skin crease just above the umbilicus. Fascia was then elevated, and a Verress needle was inserted and its proper position confirmed using aspiration and saline meniscus test.  Upon insufflation of the abdominal  cavity with carbon dioxide to a well-tolerated pressure of 12-15 mmHg, 5 mm peri-umbilical port followed by laparoscope were inserted and used to inspect the abdominal cavity and its contents with no injuries from insertion of the first trochar noted. Three additional trocars were inserted, one at the epigastric position (10 mm) and two along the Right costal margin (5 mm). The table was then placed in reverse Trendelenburg position with the Right side up. Filmy adhesions between the gallbladder and omentum/duodenum/transverse colon were lysed using combined blunt dissection and selective electrocautery. The apex/dome of the gallbladder was grasped with an atraumatic grasper passed through the lateral port and retracted apically over the liver. The infundibulum was also grasped and retracted, exposing Calot's triangle. The peritoneum overlying the gallbladder infundibulum was incised and dissected free of surrounding peritoneal attachments, revealing the cystic duct and cystic artery, which were clipped twice on the patient side and once on the gallbladder specimen side close to the gallbladder. There was noted to be a small amount of bleeding from posterior branch of cystic artery, which was likewise controlled, clipped, and confirmed to be hemostatic. The gallbladder was then dissected from its peritoneal attachments to the liver using electrocautery, and the gallbladder was placed into a laparoscopic specimen bag and removed from the abdominal cavity via the epigastric port site. Hemostasis and secure placement of clips were confirmed, and intra-peritoneal cavity was inspected with no additional findings. PMI laparoscopic fascial closure device was then used to re-approximate fascia at the 10 mm epigastric port site.  All ports were then removed under direct visualization, and abdominal cavity was desuflated. All port sites were irrigated/cleaned, additional local anesthetic was injected at each incision, 3-0  Vicryl was used to re-approximate dermis at 10 mm port site(s), and subcuticular 4-0 Monocryl suture was used to re-approximate skin. Skin was then cleaned, dried, and sterile skin glue was applied. Patient was  then safely able to be awakened, extubated, and transferred to PACU for post-operative monitoring and care.   I was present for all aspects of the above procedure, and no operative complications were apparent.

## 2018-01-14 NOTE — Transfer of Care (Signed)
Immediate Anesthesia Transfer of Care Note  Patient: Trevor Torres  Procedure(s) Performed: LAPAROSCOPIC CHOLECYSTECTOMY (N/A )  Patient Location: PACU  Anesthesia Type:General  Level of Consciousness: drowsy  Airway & Oxygen Therapy: Patient Spontanous Breathing and Patient connected to face mask oxygen  Post-op Assessment: Report given to RN and Post -op Vital signs reviewed and stable  Post vital signs: Reviewed and stable  Last Vitals:  Vitals Value Taken Time  BP 128/99 01/14/2018  5:50 PM  Temp 36.9 C 01/14/2018  5:50 PM  Pulse 50 01/14/2018  5:55 PM  Resp 11 01/14/2018  5:55 PM  SpO2 100 % 01/14/2018  5:55 PM  Vitals shown include unvalidated device data.  Last Pain:  Vitals:   01/14/18 1750  TempSrc:   PainSc: Asleep      Patients Stated Pain Goal: 2 (01/14/18 1326)  Complications: No apparent anesthesia complications

## 2018-01-15 ENCOUNTER — Encounter: Payer: Self-pay | Admitting: Surgery

## 2018-01-19 ENCOUNTER — Telehealth: Payer: Self-pay

## 2018-01-19 LAB — SURGICAL PATHOLOGY

## 2018-01-19 NOTE — Telephone Encounter (Signed)
Called patient to let him know that his FMLA Form was filled out and left at the front desk for pick up. Patient stated that he will pick it up either later on today or tomorrow. I told him that it was fine.

## 2018-01-21 DIAGNOSIS — K8020 Calculus of gallbladder without cholecystitis without obstruction: Secondary | ICD-10-CM

## 2018-01-26 ENCOUNTER — Inpatient Hospital Stay: Payer: Self-pay | Admitting: Surgery

## 2018-01-27 ENCOUNTER — Encounter: Payer: Self-pay | Admitting: Surgery

## 2018-01-27 ENCOUNTER — Ambulatory Visit (INDEPENDENT_AMBULATORY_CARE_PROVIDER_SITE_OTHER): Payer: Self-pay | Admitting: Surgery

## 2018-01-27 VITALS — BP 130/74 | HR 70 | Temp 97.7°F | Ht 69.0 in | Wt 183.0 lb

## 2018-01-27 DIAGNOSIS — Z4889 Encounter for other specified surgical aftercare: Secondary | ICD-10-CM

## 2018-01-27 NOTE — Patient Instructions (Signed)
  Please call our office with any questions or concerns.   NERAL POST-OPERATIVE PATIENT INSTRUCTIONS   WOUND CARE INSTRUCTIONS:  Keep a dry clean dressing on the wound if there is drainage. The initial bandage may be removed after 24 hours.  Once the wound has quit draining you may leave it open to air.  If clothing rubs against the wound or causes irritation and the wound is not draining you may cover it with a dry dressing during the daytime.  Try to keep the wound dry and avoid ointments on the wound unless directed to do so.  If the wound becomes bright red and painful or starts to drain infected material that is not clear, please contact your physician immediately.  If the wound is mildly pink and has a thick firm ridge underneath it, this is normal, and is referred to as a healing ridge.  This will resolve over the next 4-6 weeks.  BATHING: You may shower if you have been informed of this by your surgeon. However, Please do not submerge in a tub, hot tub, or pool until incisions are completely sealed or have been told by your surgeon that you may do so.  DIET:  You may eat any foods that you can tolerate.  It is a good idea to eat a high fiber diet and take in plenty of fluids to prevent constipation.  If you do become constipated you may want to take a mild laxative or take ducolax tablets on a daily basis until your bowel habits are regular.  Constipation can be very uncomfortable, along with straining, after recent surgery.  ACTIVITY:  You are encouraged to cough and deep breath or use your incentive spirometer if you were given one, every 15-30 minutes when awake.  This will help prevent respiratory complications and low grade fevers post-operatively if you had a general anesthetic.  You may want to hug a pillow when coughing and sneezing to add additional support to the surgical area, if you had abdominal or chest surgery, which will decrease pain during these times.  You are encouraged to  walk and engage in light activity for the next two weeks.  You should not lift more than 20 pounds, until 02/25/2018 as it could put you at increased risk for complications.  Twenty pounds is roughly equivalent to a plastic bag of groceries. At that time- Listen to your body when lifting, if you have pain when lifting, stop and then try again in a few days. Soreness after doing exercises or activities of daily living is normal as you get back in to your normal routine.  MEDICATIONS:  Try to take narcotic medications and anti-inflammatory medications, such as tylenol, ibuprofen, naprosyn, etc., with food.  This will minimize stomach upset from the medication.  Should you develop nausea and vomiting from the pain medication, or develop a rash, please discontinue the medication and contact your physician.  You should not drive, make important decisions, or operate machinery when taking narcotic pain medication.  SUNBLOCK Use sun block to incision area over the next year if this area will be exposed to sun. This helps decrease scarring and will allow you avoid a permanent darkened area over your incision.  QUESTIONS:  Please feel free to call our office if you have any questions, and we will be glad to assist you. [phone]

## 2018-01-31 ENCOUNTER — Encounter: Payer: Self-pay | Admitting: Surgery

## 2018-01-31 ENCOUNTER — Inpatient Hospital Stay: Payer: Self-pay | Admitting: Surgery

## 2018-01-31 ENCOUNTER — Ambulatory Visit: Payer: Self-pay | Admitting: Surgery

## 2018-01-31 NOTE — Progress Notes (Signed)
Surgical Clinic Progress/Follow-up Note   HPI:  47 y.o. Male presents to clinic for post-op follow-up 2 weeks s/p laparoscopic cholecystectomy Trevor Torres, 01/14/2018). Patient reports complete resolution of pre-operative pain and has been tolerating regular diet with +flatus and normal BM's, denies N/V, fever/chills, CP, or SOB.  Review of Systems:  Constitutional: denies fever/chills  Respiratory: denies shortness of breath, wheezing  Cardiovascular: denies chest pain, palpitations  Gastrointestinal: abdominal pain, N/V, and bowel function as per interval history Skin: Denies any other rashes or skin discolorations except post-surgical wounds as per interval history  Vital Signs:  BP 130/74   Pulse 70   Temp 97.7 F (36.5 C) (Temporal)   Ht 5\' 9"  (1.753 m)   Wt 183 lb (83 kg)   BMI 27.02 kg/m    Physical Exam:  Constitutional:  -- Normal body habitus  -- Awake, alert, and oriented x3  Pulmonary:  -- No crackles -- Equal breath sounds bilaterally -- Breathing non-labored at rest Cardiovascular:  -- S1, S2 present  -- No pericardial rubs  Gastrointestinal:  -- Soft and non-distended, non-tender to palpation, no guarding/rebound tenderness -- Post-surgical incisions all well-approximated without any peri-incisional erythema or drainage -- No abdominal masses appreciated, pulsatile or otherwise  Musculoskeletal / Integumentary:  -- Wounds or skin discoloration: None appreciated except post-surgical incisions as described above (GI) -- Extremities: B/L UE and LE FROM, hands and feet warm, no edema   Assessment:  47 y.o. yo Male with a problem list including...  Patient Active Problem List   Diagnosis Date Noted  . Calculus of gallbladder without cholecystitis without obstruction   . Shoulder pain 02/07/2015  . HTN (hypertension) 01/07/2015  . Vitamin B12 deficiency 01/03/2015  . 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 Major depressive disorder, recurrent, severe without psychotic behavior (HCC) 01/02/2015  . Hypothyroidism 01/02/2015  . Opioid use disorder, mild, abuse (HCC) 01/02/2015  . Rotator cuff injury 01/02/2015  . 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  Plan:              - advance diet as tolerated              - okay to submerge incisions under water (baths, swimming) prn             - gradually resume all activities without restrictions over next 2 weeks             - apply sunblock particularly to incisions with sun exposure to reduce pigmentation of scars             - return to clinic as needed, instructed to call office if any questions or concerns  All of the above recommendations were discussed with the patient, and all of patient's questions were answered to his expressed satisfaction.  -- Scherrie GerlachJason E. Trevor Plateravis, MD, RPVI [place]: [facility] General Surgery - Partnering for exceptional care. Office: [phone]

## 2019-03-31 ENCOUNTER — Other Ambulatory Visit: Payer: Self-pay

## 2019-03-31 DIAGNOSIS — Z20822 Contact with and (suspected) exposure to covid-19: Secondary | ICD-10-CM

## 2019-04-02 LAB — NOVEL CORONAVIRUS, NAA: SARS-CoV-2, NAA: NOT DETECTED

## 2020-01-16 ENCOUNTER — Ambulatory Visit: Payer: Self-pay | Admitting: General Surgery

## 2020-01-16 NOTE — H&P (View-Only) (Signed)
PATIENT PROFILE: Trevor Torres is a 49 y.o. male who presents to the Clinic for consultation at the request of Dr. Chalmers Guest for evaluation of incisional hernia.  PCP:  Provider  HISTORY OF PRESENT ILLNESS: Trevor Torres reports started having pain around his bellybutton seems last week.  He was lifting heavy boxes when he started having significant pain around his bellybutton.  Pain radiates superiorly and inferiorly on the abdominal midline.  Aggravating factor is doing heavy lifting.  There is no alleviating factor at this moment.  Patient denies abdominal distention nausea or vomiting.  Patient has history of laparoscopic cholecystectomy and sleeve gastrectomy in the past.  He denies any issues with the surgeries.   PROBLEM LIST:        Problem List  Date Reviewed: 01/11/2020       Noted   Chronic depression 01/18/2015   B12 deficiency 01/14/2015   Anxiety 01/14/2015   Psychophysiological insomnia 01/14/2015   Overview    Tried to od on benadryl. Ambien makes him 'groggy". Ativan does not help.       Baker cyst 12/04/2013   Left knee pain 04/26/2013   Headache(784.0) 04/07/2012   Depression Unknown   Overview    Hospitalized 8/16 for suicidal attempt      Hyperlipidemia Unknown   Attention deficit disorder (ADD) Unknown   Obsessive-compulsive disorders Unknown   Obstructive sleep apnea (adult) (pediatric) 05/16/1898   Unspecified hypothyroidism 05/16/1898      GENERAL REVIEW OF SYSTEMS:   General ROS: negative for - chills, fatigue, fever, weight gain or weight loss Allergy and Immunology ROS: negative for - hives  Hematological and Lymphatic ROS: negative for - bleeding problems or bruising, negative for palpable nodes Endocrine ROS: negative for - heat or cold intolerance, hair changes Respiratory ROS: negative for - cough, shortness of breath or wheezing Cardiovascular ROS: no chest pain or palpitations GI ROS: negative for nausea, vomiting,  diarrhea, constipation.  Positive abdominal pain Musculoskeletal ROS: negative for - joint swelling or muscle pain Neurological ROS: negative for - confusion, syncope Dermatological ROS: negative for pruritus and rash Psychiatric: negative for anxiety, depression, difficulty sleeping and memory loss  MEDICATIONS: Current Medications        Current Outpatient Medications  Medication Sig Dispense Refill  . amLODIPine (NORVASC) 10 MG tablet Take by mouth. (Patient not taking: Reported on 12/21/2019  )    . hydrOXYzine (ATARAX) 25 MG tablet Take by mouth. (Patient not taking: Reported on 12/21/2019  )    . sertraline (ZOLOFT) 100 MG tablet Take 100 mg by mouth once daily. (Patient not taking: Reported on 12/21/2019  )              Current Facility-Administered Medications  Medication Dose Route Frequency Provider Last Rate Last Admin  . cyanocobalamin (VITAMIN B12) injection 1,000 mcg  1,000 mcg Intramuscular Q30 Days Titus Mould, NP   1,000 mcg at 01/14/15 1009      ALLERGIES: Nsaids (non-steroidal anti-inflammatory drug)  PAST MEDICAL HISTORY:     Past Medical History:  Diagnosis Date  . Abdominal pain, right upper quadrant   . Attention deficit disorder (ADD)   . Cellulitis and abscess of neck   . Depression   . Hyperlipidemia   . Hypertension   . Nonspecific abnormal results of kidney function study   . Obsessive-compulsive disorders   . Obstructive sleep apnea (adult) (pediatric) 8.8.12   not using CPAP 03/2012  . Pancreatitis   . Unspecified disorder of skin  and subcutaneous tissue   . Unspecified hypothyroidism 8.8.12    PAST SURGICAL HISTORY:      Past Surgical History:  Procedure Laterality Date  . COLONOSCOPY  01/03/2014 [facility]   negative colitis/Normal/Repeat 43yrs/[place]  . GASTRECTOMY  04/04/2013   Gastric Sleeve  . gastric sleeve    . UPPER GASTROINTESTINAL ENDOSCOPY  01/03/2014 [facility]   Mild gastritis/Normal/No Repeat/[place]      FAMILY HISTORY:      Family History  Problem Relation Age of Onset  . High blood pressure (Hypertension) Mother   . Diabetes type II Mother   . Breast cancer Mother   . Anxiety Mother   . Coronary Artery Disease (Blocked arteries around heart) Mother   . Depression Mother   . Obesity Mother   . Thyroid disease Mother   . Diabetes type II Father   . Colon polyps Father   . Hyperlipidemia (Elevated cholesterol) Father   . High blood pressure (Hypertension) Father   . Obesity Father   . High blood pressure (Hypertension) Maternal Grandmother   . Diabetes type II Maternal Grandmother   . Coronary Artery Disease (Blocked arteries around heart) Maternal Grandmother   . Coronary Artery Disease (Blocked arteries around heart) Maternal Grandfather   . Hyperlipidemia (Elevated cholesterol) Maternal Grandfather   . High blood pressure (Hypertension) Paternal Grandmother   . High blood pressure (Hypertension) Paternal Grandfather   . Anxiety Son   . Depression Son      SOCIAL HISTORY: Social History          Socioeconomic History  . Marital status: Single    Spouse name: Not on file  . Number of children: Not on file  . Years of education: Not on file  . Highest education level: Not on file  Occupational History  . Occupation: [employer]    Comment: start 07/2011  Tobacco Use  . Smoking status: Never Smoker  . Smokeless tobacco: Never Used  Vaping Use  . Vaping Use: Never used  Substance and Sexual Activity  . Alcohol use: Yes    Alcohol/week: 0.0 standard drinks    Comment: rare/socially  . Drug use: No  . Sexual activity: Defer  Other Topics Concern  . Not on file  Social History Narrative   Married, lives with wife and son, born 2996. GED. 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    Social Determinants of Health      Financial Resource Strain:   . Difficulty of Paying Living Expenses:    Food Insecurity:   . Worried About Programme researcher, broadcasting/film/video in the Last Year:   . Barista in the Last Year:   Transportation Needs:   . Freight forwarder (Medical):   Marland Kitchen Lack of Transportation (Non-Medical):       PHYSICAL EXAM:    Vitals:   01/16/20 1157  BP: 128/85  Pulse: 59   Body mass index is 29.98 kg/m. Weight: 92.1 kg (203 lb)   GENERAL: Alert, active, oriented x3  HEENT: Pupils equal reactive to light. Extraocular movements are intact. Sclera clear. Palpebral conjunctiva normal red color.Pharynx clear.  NECK: Supple with no palpable mass and no adenopathy.  LUNGS: Sound clear with no rales rhonchi or wheezes.  HEART: Regular rhythm S1 and S2 without murmur.  ABDOMEN: Soft and depressible, nontender with no palpable mass, no hepatomegaly.  Incarcerated fat-containing umbilical/incisional hernia.  Severe internal to palpation.  Diastases recti.  No  skin changes.  EXTREMITIES: Well-developed well-nourished symmetrical with no dependent edema.  NEUROLOGICAL: Awake alert oriented, facial expression symmetrical, moving all extremities.  REVIEW OF DATA: I have reviewed the following data today:      No visits with results within 3 Month(s) from this visit.  Latest known visit with results is:  Office Visit on 05/26/2013  Component Date Value  . Thyroid Stimulating Horm* 05/26/2013 13.76*     ASSESSMENT: Trevor Torres is a 49 y.o. male presenting for consultation for incarcerated incisional hernia.  Patient with an incarcerated incisional hernia with fat-containing tissue.  The area is very tender to palpation unable to be reduced due to the pain.  I think that there is any sign of strangulation at this moment.  This is more likely caused with heavy lifting.  Due to the recent I will restrict patient to do any heavy lifting until the hernia surgically repaired.  Patient oriented about the surgical management with robotic assisted laparoscopic  incisional hernia repair.  Using the laparoscopic approach I will be able to evaluate the whole abdominal wall and if there is any other defect on the diastases recti will be able to be repaired.  Patient oriented about the risk of surgery that includes pain, bleeding, infection, scar tissue, bowel obstruction, injury to intestine, among others.  Patient understood and agree to proceed.  Incisional hernia, without obstruction or gangrene [K43.2]  PLAN: 1. Robotic assisted laparoscopic incisional hernia repair with mesh (29476) 2. Patient needs to be in light duty restriction (no heavy lifting >20 pounds until surgery) 3. CBC, CMP 4. Avoid taking aspirin 5 days before surgery 5. Contact us if you have any concern.   Patient verbalized understanding, all questions were answered, and were agreeable with the plan outlined above.     Carolan Shiver, MD  Electronically signed by Carolan Shiver, MD

## 2020-01-16 NOTE — H&P (Signed)
PATIENT PROFILE: Icarus Partch is a 49 y.o. male who presents to the Clinic for consultation at the request of Dr. Chalmers Guest for evaluation of incisional hernia.  PCP:  Provider  HISTORY OF PRESENT ILLNESS: Mr. Faith reports started having pain around his bellybutton seems last week.  He was lifting heavy boxes when he started having significant pain around his bellybutton.  Pain radiates superiorly and inferiorly on the abdominal midline.  Aggravating factor is doing heavy lifting.  There is no alleviating factor at this moment.  Patient denies abdominal distention nausea or vomiting.  Patient has history of laparoscopic cholecystectomy and sleeve gastrectomy in the past.  He denies any issues with the surgeries.   PROBLEM LIST:        Problem List  Date Reviewed: 01/11/2020       Noted   Chronic depression 01/18/2015   B12 deficiency 01/14/2015   Anxiety 01/14/2015   Psychophysiological insomnia 01/14/2015   Overview    Tried to od on benadryl. Ambien makes him 'groggy". Ativan does not help.       Baker cyst 12/04/2013   Left knee pain 04/26/2013   Headache(784.0) 04/07/2012   Depression Unknown   Overview    Hospitalized 8/16 for suicidal attempt      Hyperlipidemia Unknown   Attention deficit disorder (ADD) Unknown   Obsessive-compulsive disorders Unknown   Obstructive sleep apnea (adult) (pediatric) 05/16/1898   Unspecified hypothyroidism 05/16/1898      GENERAL REVIEW OF SYSTEMS:   General ROS: negative for - chills, fatigue, fever, weight gain or weight loss Allergy and Immunology ROS: negative for - hives  Hematological and Lymphatic ROS: negative for - bleeding problems or bruising, negative for palpable nodes Endocrine ROS: negative for - heat or cold intolerance, hair changes Respiratory ROS: negative for - cough, shortness of breath or wheezing Cardiovascular ROS: no chest pain or palpitations GI ROS: negative for nausea, vomiting,  diarrhea, constipation.  Positive abdominal pain Musculoskeletal ROS: negative for - joint swelling or muscle pain Neurological ROS: negative for - confusion, syncope Dermatological ROS: negative for pruritus and rash Psychiatric: negative for anxiety, depression, difficulty sleeping and memory loss  MEDICATIONS: Current Medications        Current Outpatient Medications  Medication Sig Dispense Refill  . amLODIPine (NORVASC) 10 MG tablet Take by mouth. (Patient not taking: Reported on 12/21/2019  )    . hydrOXYzine (ATARAX) 25 MG tablet Take by mouth. (Patient not taking: Reported on 12/21/2019  )    . sertraline (ZOLOFT) 100 MG tablet Take 100 mg by mouth once daily. (Patient not taking: Reported on 12/21/2019  )              Current Facility-Administered Medications  Medication Dose Route Frequency Provider Last Rate Last Admin  . cyanocobalamin (VITAMIN B12) injection 1,000 mcg  1,000 mcg Intramuscular Q30 Days Titus Mould, NP   1,000 mcg at 01/14/15 1009      ALLERGIES: Nsaids (non-steroidal anti-inflammatory drug)  PAST MEDICAL HISTORY:     Past Medical History:  Diagnosis Date  . Abdominal pain, right upper quadrant   . Attention deficit disorder (ADD)   . Cellulitis and abscess of neck   . Depression   . Hyperlipidemia   . Hypertension   . Nonspecific abnormal results of kidney function study   . Obsessive-compulsive disorders   . Obstructive sleep apnea (adult) (pediatric) 8.8.12   not using CPAP 03/2012  . Pancreatitis   . Unspecified disorder of skin  and subcutaneous tissue   . Unspecified hypothyroidism 8.8.12    PAST SURGICAL HISTORY:      Past Surgical History:  Procedure Laterality Date  . COLONOSCOPY  01/03/2014 [facility]   negative colitis/Normal/Repeat 43yrs/[place]  . GASTRECTOMY  04/04/2013   Gastric Sleeve  . gastric sleeve    . UPPER GASTROINTESTINAL ENDOSCOPY  01/03/2014 [facility]   Mild gastritis/Normal/No Repeat/[place]      FAMILY HISTORY:      Family History  Problem Relation Age of Onset  . High blood pressure (Hypertension) Mother   . Diabetes type II Mother   . Breast cancer Mother   . Anxiety Mother   . Coronary Artery Disease (Blocked arteries around heart) Mother   . Depression Mother   . Obesity Mother   . Thyroid disease Mother   . Diabetes type II Father   . Colon polyps Father   . Hyperlipidemia (Elevated cholesterol) Father   . High blood pressure (Hypertension) Father   . Obesity Father   . High blood pressure (Hypertension) Maternal Grandmother   . Diabetes type II Maternal Grandmother   . Coronary Artery Disease (Blocked arteries around heart) Maternal Grandmother   . Coronary Artery Disease (Blocked arteries around heart) Maternal Grandfather   . Hyperlipidemia (Elevated cholesterol) Maternal Grandfather   . High blood pressure (Hypertension) Paternal Grandmother   . High blood pressure (Hypertension) Paternal Grandfather   . Anxiety Son   . Depression Son      SOCIAL HISTORY: Social History          Socioeconomic History  . Marital status: Single    Spouse name: Not on file  . Number of children: Not on file  . Years of education: Not on file  . Highest education level: Not on file  Occupational History  . Occupation: [employer]    Comment: start 07/2011  Tobacco Use  . Smoking status: Never Smoker  . Smokeless tobacco: Never Used  Vaping Use  . Vaping Use: Never used  Substance and Sexual Activity  . Alcohol use: Yes    Alcohol/week: 0.0 standard drinks    Comment: rare/socially  . Drug use: No  . Sexual activity: Defer  Other Topics Concern  . Not on file  Social History Narrative   Married, lives with wife and son, born 2996. GED. 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    Social Determinants of Health      Financial Resource Strain:   . Difficulty of Paying Living Expenses:    Food Insecurity:   . Worried About Programme researcher, broadcasting/film/video in the Last Year:   . Barista in the Last Year:   Transportation Needs:   . Freight forwarder (Medical):   Marland Kitchen Lack of Transportation (Non-Medical):       PHYSICAL EXAM:    Vitals:   01/16/20 1157  BP: 128/85  Pulse: 59   Body mass index is 29.98 kg/m. Weight: 92.1 kg (203 lb)   GENERAL: Alert, active, oriented x3  HEENT: Pupils equal reactive to light. Extraocular movements are intact. Sclera clear. Palpebral conjunctiva normal red color.Pharynx clear.  NECK: Supple with no palpable mass and no adenopathy.  LUNGS: Sound clear with no rales rhonchi or wheezes.  HEART: Regular rhythm S1 and S2 without murmur.  ABDOMEN: Soft and depressible, nontender with no palpable mass, no hepatomegaly.  Incarcerated fat-containing umbilical/incisional hernia.  Severe internal to palpation.  Diastases recti.  No  skin changes.  EXTREMITIES: Well-developed well-nourished symmetrical with no dependent edema.  NEUROLOGICAL: Awake alert oriented, facial expression symmetrical, moving all extremities.  REVIEW OF DATA: I have reviewed the following data today:      No visits with results within 3 Month(s) from this visit.  Latest known visit with results is:  Office Visit on 05/26/2013  Component Date Value  . Thyroid Stimulating Horm* 05/26/2013 13.76*     ASSESSMENT: Mr. Martine is a 49 y.o. male presenting for consultation for incarcerated incisional hernia.  Patient with an incarcerated incisional hernia with fat-containing tissue.  The area is very tender to palpation unable to be reduced due to the pain.  I think that there is any sign of strangulation at this moment.  This is more likely caused with heavy lifting.  Due to the recent I will restrict patient to do any heavy lifting until the hernia surgically repaired.  Patient oriented about the surgical management with robotic assisted laparoscopic  incisional hernia repair.  Using the laparoscopic approach I will be able to evaluate the whole abdominal wall and if there is any other defect on the diastases recti will be able to be repaired.  Patient oriented about the risk of surgery that includes pain, bleeding, infection, scar tissue, bowel obstruction, injury to intestine, among others.  Patient understood and agree to proceed.  Incisional hernia, without obstruction or gangrene [K43.2]  PLAN: 1. Robotic assisted laparoscopic incisional hernia repair with mesh (29476) 2. Patient needs to be in light duty restriction (no heavy lifting >20 pounds until surgery) 3. CBC, CMP 4. Avoid taking aspirin 5 days before surgery 5. Contact us if you have any concern.   Patient verbalized understanding, all questions were answered, and were agreeable with the plan outlined above.     Carolan Shiver, MD  Electronically signed by Carolan Shiver, MD

## 2020-01-23 ENCOUNTER — Encounter
Admission: RE | Admit: 2020-01-23 | Discharge: 2020-01-23 | Disposition: A | Discharge status: Home / Self Care | Payer: Managed Care, Other (non HMO) | Source: Ambulatory Visit | Attending: General Surgery | Admitting: General Surgery

## 2020-01-23 ENCOUNTER — Other Ambulatory Visit: Payer: Self-pay

## 2020-01-23 NOTE — Patient Instructions (Signed)
Your procedure is scheduled on: 01/29/20 Report to [place]. To find out your arrival time please call [phone] between 1PM - 3PM on 01/28/20.  Remember: Instructions that are not followed completely may result in serious medical risk, up to and including death, or upon the discretion of your surgeon and anesthesiologist your surgery may need to be rescheduled.     _X__ 1. Do not eat food after midnight the night before your procedure.                 No gum chewing or hard candies. You may drink clear liquids up to 2 hours                 before you are scheduled to arrive for your surgery- DO not drink clear                 liquids within 2 hours of the start of your surgery.                 Clear Liquids include:  water, apple juice without pulp, clear carbohydrate                 drink such as Clearfast or Gatorade, Black Coffee or Tea (Do not add                 anything to coffee or tea). Diabetics water only  __X__2.  On the morning of surgery brush your teeth with toothpaste and water, you                 may rinse your mouth with mouthwash if you wish.  Do not swallow any              toothpaste of mouthwash.     _X__ 3.  No Alcohol for 24 hours before or after surgery.   _X__ 4.  Do Not Smoke or use e-cigarettes For 24 Hours Prior to Your Surgery.                 Do not use any chewable tobacco products for at least 6 hours prior to                 surgery.  ____  5.  Bring all medications with you on the day of surgery if instructed.   __X__  6.  Notify your doctor if there is any change in your medical condition      (cold, fever, infections).     Do not wear jewelry, make-up, hairpins, clips or nail polish. Do not wear lotions, powders, or perfumes.  Do not shave 48 hours prior to surgery. Men may shave face and neck. Do not bring valuables to the hospital.    [facility] is not responsible for any belongings or  valuables.  Contacts, dentures/partials or body piercings may not be worn into surgery. Bring a case for your contacts, glasses or hearing aids, a denture cup will be supplied. Leave your suitcase in the car. After surgery it may be brought to your room. For patients admitted to the hospital, discharge time is determined by your treatment team.   Patients discharged the day of surgery will not be allowed to drive home.   Please read over the following fact sheets that you were given:   MRSA Information  __X__ Take these medicines the morning of surgery with A SIP OF WATER:  1. NONE  2.   3.   4.  5.  6.  ____ Fleet Enema (as directed)   __X__ Use CHG Soap/SAGE wipes as directed  ____ Use inhalers on the day of surgery  ____ Stop metformin/Janumet/Farxiga 2 days prior to surgery    ____ Take 1/2 of usual insulin dose the night before surgery. No insulin the morning          of surgery.   ____ Stop Blood Thinners Coumadin/Plavix/Xarelto/Pleta/Pradaxa/Eliquis/Effient/Aspirin  on   Or contact your Surgeon, Cardiologist or Medical Doctor regarding  ability to stop your blood thinners  __X__ Stop Anti-inflammatories 7 days before surgery such as Advil, Ibuprofen, Motrin,  BC or Goodies Powder, Naprosyn, Naproxen, Aleve, Aspirin    __X__ Stop all herbal supplements, fish oil or vitamin E until after surgery.    ____ Bring C-Pap to the hospital.       How to Use Chlorhexidine for Bathing Chlorhexidine gluconate (CHG) is a germ-killing (antiseptic) solution that is used to clean the skin. It can get rid of the bacteria that normally live on the skin and can keep them away for about 24 hours. To clean your skin with CHG, you may be given:  A CHG solution to use in the shower or as part of a sponge bath.  A prepackaged cloth that contains CHG. Cleaning your skin with CHG may help lower the risk for infection:  While you are staying in the intensive care unit of the  hospital.  If you have a vascular access, such as a central line, to provide short-term or long-term access to your veins.  If you have a catheter to drain urine from your bladder.  If you are on a ventilator. A ventilator is a machine that helps you breathe by moving air in and out of your lungs.  After surgery. What are the risks? Risks of using CHG include:  A skin reaction.  Hearing loss, if CHG gets in your ears.  Eye injury, if CHG gets in your eyes and is not rinsed out.  The CHG product catching fire. Make sure that you avoid smoking and flames after applying CHG to your skin. Do not use CHG:  If you have a chlorhexidine allergy or have previously reacted to chlorhexidine.  On babies younger than 68 months of age. How to use CHG solution  Use CHG only as told by your health care provider, and follow the instructions on the label.  Use the full amount of CHG as directed. Usually, this is one bottle. During a shower Follow these steps when using CHG solution during a shower (unless your health care provider gives you different instructions): 1. Start the shower. 2. Use your normal soap and shampoo to wash your face and hair. 3. Turn off the shower or move out of the shower stream. 4. Pour the CHG onto a clean washcloth. Do not use any type of brush or rough-edged sponge. 5. Starting at your neck, lather your body down to your toes. Make sure you follow these instructions: ? If you will be having surgery, pay special attention to the part of your body where you will be having surgery. Scrub this area for at least 1 minute. ? Do not use CHG on your head or face. If the solution gets into your ears or eyes, rinse them well with water. ? Avoid your genital area. ? Avoid any areas of skin that have broken skin, cuts, or scrapes. ? Scrub your  back and under your arms. Make sure to wash skin folds. 6. Let the lather sit on your skin for 1-2 minutes or as long as told by your  health care provider. 7. Thoroughly rinse your entire body in the shower. Make sure that all body creases and crevices are rinsed well. 8. Dry off with a clean towel. Do not put any substances on your body afterward--such as powder, lotion, or perfume--unless you are told to do so by your health care provider. Only use lotions that are recommended by the manufacturer. 9. Put on clean clothes or pajamas. 10. If it is the night before your surgery, sleep in clean sheets. 11. REPEAT THE MORNING OF SURGERY

## 2020-01-25 ENCOUNTER — Other Ambulatory Visit: Payer: Self-pay

## 2020-01-25 ENCOUNTER — Other Ambulatory Visit: Payer: Managed Care, Other (non HMO)

## 2020-01-25 ENCOUNTER — Other Ambulatory Visit
Admission: RE | Admit: 2020-01-25 | Discharge: 2020-01-25 | Disposition: A | Discharge status: Home / Self Care | Payer: Managed Care, Other (non HMO) | Source: Ambulatory Visit | Attending: General Surgery | Admitting: General Surgery

## 2020-01-25 DIAGNOSIS — Z01818 Encounter for other preprocedural examination: Secondary | ICD-10-CM | POA: Insufficient documentation

## 2020-01-25 DIAGNOSIS — Z20822 Contact with and (suspected) exposure to covid-19: Secondary | ICD-10-CM | POA: Diagnosis not present

## 2020-01-26 ENCOUNTER — Other Ambulatory Visit: Payer: Self-pay | Admitting: General Surgery

## 2020-01-26 LAB — SARS CORONAVIRUS 2 (TAT 6-24 HRS): SARS Coronavirus 2: NEGATIVE

## 2020-01-26 MED ORDER — ONDANSETRON HCL 4 MG PO TABS: 4.0000 mg | ORAL_TABLET | Freq: Three times a day (TID) | ORAL | 0 refills | Status: DC | PRN

## 2020-01-26 MED ORDER — HYDROCODONE-ACETAMINOPHEN 5-325 MG PO TABS
1.0000 | ORAL_TABLET | ORAL | 0 refills | Status: AC | PRN
Start: 2020-01-26 — End: 2020-01-29

## 2020-01-29 ENCOUNTER — Encounter
Admission: RE | Disposition: A | Discharge status: Home / Self Care | Payer: Self-pay | Source: Home / Self Care | Attending: General Surgery

## 2020-01-29 ENCOUNTER — Ambulatory Visit: Payer: No Typology Code available for payment source | Admitting: Anesthesiology

## 2020-01-29 ENCOUNTER — Ambulatory Visit
Admission: RE | Admit: 2020-01-29 | Discharge: 2020-01-29 | Disposition: A | Discharge status: Home / Self Care | Payer: No Typology Code available for payment source | Attending: General Surgery | Admitting: General Surgery

## 2020-01-29 ENCOUNTER — Encounter: Payer: Self-pay | Admitting: General Surgery

## 2020-01-29 DIAGNOSIS — E785 Hyperlipidemia, unspecified: Secondary | ICD-10-CM | POA: Insufficient documentation

## 2020-01-29 DIAGNOSIS — G4733 Obstructive sleep apnea (adult) (pediatric): Secondary | ICD-10-CM | POA: Diagnosis not present

## 2020-01-29 DIAGNOSIS — F419 Anxiety disorder, unspecified: Secondary | ICD-10-CM | POA: Diagnosis not present

## 2020-01-29 DIAGNOSIS — F329 Major depressive disorder, single episode, unspecified: Secondary | ICD-10-CM | POA: Insufficient documentation

## 2020-01-29 DIAGNOSIS — K432 Incisional hernia without obstruction or gangrene: Secondary | ICD-10-CM | POA: Insufficient documentation

## 2020-01-29 DIAGNOSIS — R112 Nausea with vomiting, unspecified: Secondary | ICD-10-CM

## 2020-01-29 DIAGNOSIS — Z79899 Other long term (current) drug therapy: Secondary | ICD-10-CM | POA: Insufficient documentation

## 2020-01-29 DIAGNOSIS — I10 Essential (primary) hypertension: Secondary | ICD-10-CM | POA: Diagnosis not present

## 2020-01-29 DIAGNOSIS — Z9889 Other specified postprocedural states: Secondary | ICD-10-CM

## 2020-01-29 DIAGNOSIS — Z9884 Bariatric surgery status: Secondary | ICD-10-CM | POA: Diagnosis not present

## 2020-01-29 HISTORY — PX: XI ROBOTIC ASSISTED VENTRAL HERNIA: SHX6789

## 2020-01-29 HISTORY — DX: Nausea with vomiting, unspecified: R11.2

## 2020-01-29 HISTORY — DX: Other specified postprocedural states: Z98.890

## 2020-01-29 SURGERY — REPAIR, HERNIA, VENTRAL, ROBOT-ASSISTED
Anesthesia: General | Site: Abdomen

## 2020-01-29 MED ORDER — MIDAZOLAM HCL 2 MG/2ML IJ SOLN
INTRAMUSCULAR | Status: AC
  Filled 2020-01-29: qty 2

## 2020-01-29 MED ORDER — BUPIVACAINE-EPINEPHRINE (PF) 0.25% -1:200000 IJ SOLN
INTRAMUSCULAR | Status: AC
  Filled 2020-01-29: qty 30

## 2020-01-29 MED ORDER — OXYCODONE HCL 5 MG PO TABS
5.0000 mg | ORAL_TABLET | Freq: Once | ORAL | Status: AC | PRN
  Administered 2020-01-29: 5 mg via ORAL

## 2020-01-29 MED ORDER — EPHEDRINE SULFATE 50 MG/ML IJ SOLN
INTRAMUSCULAR | Status: DC | PRN
  Administered 2020-01-29 (×2): 5 mg via INTRAVENOUS

## 2020-01-29 MED ORDER — SUGAMMADEX SODIUM 200 MG/2ML IV SOLN
INTRAVENOUS | Status: DC | PRN
  Administered 2020-01-29: 250 mg via INTRAVENOUS

## 2020-01-29 MED ORDER — FENTANYL CITRATE (PF) 100 MCG/2ML IJ SOLN
25.0000 ug | INTRAMUSCULAR | Status: DC | PRN
  Administered 2020-01-29 (×3): 25 ug via INTRAVENOUS

## 2020-01-29 MED ORDER — CEFAZOLIN SODIUM-DEXTROSE 2-4 GM/100ML-% IV SOLN
INTRAVENOUS | Status: AC
  Filled 2020-01-29: qty 100

## 2020-01-29 MED ORDER — LIDOCAINE HCL (CARDIAC) PF 100 MG/5ML IV SOSY
PREFILLED_SYRINGE | INTRAVENOUS | Status: DC | PRN
  Administered 2020-01-29: 100 mg via INTRAVENOUS

## 2020-01-29 MED ORDER — OXYCODONE HCL 5 MG PO TABS
ORAL_TABLET | ORAL | Status: DC
Start: 2020-01-29 — End: 2020-01-29
  Filled 2020-01-29: qty 1

## 2020-01-29 MED ORDER — ACETAMINOPHEN 10 MG/ML IV SOLN
INTRAVENOUS | Status: AC
  Filled 2020-01-29: qty 100

## 2020-01-29 MED ORDER — ONDANSETRON HCL 4 MG/2ML IJ SOLN
INTRAMUSCULAR | Status: AC
  Filled 2020-01-29: qty 2

## 2020-01-29 MED ORDER — MIDAZOLAM HCL 2 MG/2ML IJ SOLN
INTRAMUSCULAR | Status: DC | PRN
  Administered 2020-01-29: 2 mg via INTRAVENOUS

## 2020-01-29 MED ORDER — PROMETHAZINE HCL 25 MG/ML IJ SOLN
INTRAMUSCULAR | Status: AC
  Filled 2020-01-29: qty 1

## 2020-01-29 MED ORDER — PHENYLEPHRINE HCL (PRESSORS) 10 MG/ML IV SOLN
INTRAVENOUS | Status: AC
  Filled 2020-01-29: qty 1

## 2020-01-29 MED ORDER — FAMOTIDINE 20 MG PO TABS
ORAL_TABLET | ORAL | Status: AC
  Administered 2020-01-29: 20 mg via ORAL
  Filled 2020-01-29: qty 1

## 2020-01-29 MED ORDER — ORAL CARE MOUTH RINSE: 15.0000 mL | Freq: Once | OROMUCOSAL | Status: AC

## 2020-01-29 MED ORDER — ROCURONIUM BROMIDE 10 MG/ML (PF) SYRINGE
PREFILLED_SYRINGE | INTRAVENOUS | Status: AC
  Filled 2020-01-29: qty 10

## 2020-01-29 MED ORDER — ROCURONIUM BROMIDE 100 MG/10ML IV SOLN
INTRAVENOUS | Status: DC | PRN
  Administered 2020-01-29: 20 mg via INTRAVENOUS
  Administered 2020-01-29: 10 mg via INTRAVENOUS
  Administered 2020-01-29: 50 mg via INTRAVENOUS
  Administered 2020-01-29 (×2): 20 mg via INTRAVENOUS

## 2020-01-29 MED ORDER — PROMETHAZINE HCL 25 MG/ML IJ SOLN
12.5000 mg | INTRAMUSCULAR | Status: DC | PRN
  Administered 2020-01-29: 25 mg via INTRAVENOUS

## 2020-01-29 MED ORDER — FENTANYL CITRATE (PF) 100 MCG/2ML IJ SOLN
INTRAMUSCULAR | Status: DC | PRN
Start: 2020-01-29 — End: 2020-01-29
  Administered 2020-01-29: 100 ug via INTRAVENOUS
  Administered 2020-01-29 (×2): 50 ug via INTRAVENOUS

## 2020-01-29 MED ORDER — PROPOFOL 10 MG/ML IV BOLUS
INTRAVENOUS | Status: DC | PRN
  Administered 2020-01-29: 200 mg via INTRAVENOUS

## 2020-01-29 MED ORDER — ONDANSETRON HCL 4 MG/2ML IJ SOLN
4.0000 mg | Freq: Once | INTRAMUSCULAR | Status: AC | PRN
  Administered 2020-01-29: 4 mg via INTRAVENOUS

## 2020-01-29 MED ORDER — OXYCODONE HCL 5 MG/5ML PO SOLN: 5.0000 mg | Freq: Once | ORAL | Status: AC | PRN

## 2020-01-29 MED ORDER — FENTANYL CITRATE (PF) 100 MCG/2ML IJ SOLN
INTRAMUSCULAR | Status: AC
  Administered 2020-01-29: 25 ug via INTRAVENOUS
  Filled 2020-01-29: qty 2

## 2020-01-29 MED ORDER — CHLORHEXIDINE GLUCONATE 0.12 % MT SOLN: 15.0000 mL | Freq: Once | OROMUCOSAL | Status: AC

## 2020-01-29 MED ORDER — GLYCOPYRROLATE 0.2 MG/ML IJ SOLN
INTRAMUSCULAR | Status: DC | PRN
  Administered 2020-01-29: .1 mg via INTRAVENOUS

## 2020-01-29 MED ORDER — ONDANSETRON HCL 4 MG/2ML IJ SOLN
INTRAMUSCULAR | Status: DC | PRN
  Administered 2020-01-29: 4 mg via INTRAVENOUS

## 2020-01-29 MED ORDER — CEFAZOLIN SODIUM-DEXTROSE 2-4 GM/100ML-% IV SOLN
2.0000 g | INTRAVENOUS | Status: AC
  Administered 2020-01-29: 2 g via INTRAVENOUS

## 2020-01-29 MED ORDER — FAMOTIDINE 20 MG PO TABS: 20.0000 mg | ORAL_TABLET | Freq: Once | ORAL | Status: AC

## 2020-01-29 MED ORDER — BUPIVACAINE-EPINEPHRINE (PF) 0.25% -1:200000 IJ SOLN
INTRAMUSCULAR | Status: DC | PRN
  Administered 2020-01-29: 10 mL via PERINEURAL

## 2020-01-29 MED ORDER — LACTATED RINGERS IV SOLN: INTRAVENOUS | Status: DC

## 2020-01-29 MED ORDER — GLYCOPYRROLATE 0.2 MG/ML IJ SOLN
INTRAMUSCULAR | Status: AC
  Filled 2020-01-29: qty 1

## 2020-01-29 MED ORDER — 0.9 % SODIUM CHLORIDE (POUR BTL) OPTIME
TOPICAL | Status: DC | PRN
  Administered 2020-01-29: 500 mL

## 2020-01-29 MED ORDER — EPHEDRINE 5 MG/ML INJ
INTRAVENOUS | Status: AC
  Filled 2020-01-29: qty 10

## 2020-01-29 MED ORDER — CHLORHEXIDINE GLUCONATE 0.12 % MT SOLN
OROMUCOSAL | Status: AC
  Administered 2020-01-29: 15 mL via OROMUCOSAL
  Filled 2020-01-29: qty 15

## 2020-01-29 MED ORDER — SEVOFLURANE IN SOLN
RESPIRATORY_TRACT | Status: AC
  Filled 2020-01-29: qty 250

## 2020-01-29 MED ORDER — PROPOFOL 10 MG/ML IV BOLUS
INTRAVENOUS | Status: AC
  Filled 2020-01-29: qty 20

## 2020-01-29 MED ORDER — LIDOCAINE HCL (PF) 2 % IJ SOLN
INTRAMUSCULAR | Status: AC
  Filled 2020-01-29: qty 5

## 2020-01-29 MED ORDER — SODIUM CHLORIDE FLUSH 0.9 % IV SOLN
INTRAVENOUS | Status: AC
  Filled 2020-01-29: qty 10

## 2020-01-29 MED ORDER — DEXAMETHASONE SODIUM PHOSPHATE 10 MG/ML IJ SOLN
INTRAMUSCULAR | Status: DC | PRN
  Administered 2020-01-29: 10 mg via INTRAVENOUS

## 2020-01-29 MED ORDER — FENTANYL CITRATE (PF) 250 MCG/5ML IJ SOLN
INTRAMUSCULAR | Status: AC
  Filled 2020-01-29: qty 5

## 2020-01-29 MED ORDER — ACETAMINOPHEN 10 MG/ML IV SOLN
INTRAVENOUS | Status: DC | PRN
  Administered 2020-01-29: 1000 mg via INTRAVENOUS

## 2020-01-29 SURGICAL SUPPLY — 48 items
ADH SKN CLS APL DERMABOND .7 (GAUZE/BANDAGES/DRESSINGS) ×1
APL PRP STRL LF DISP 70% ISPRP (MISCELLANEOUS) ×1
BLADE SURG SZ11 CARB STEEL (BLADE) ×2 IMPLANT
CANISTER SUCT 1200ML W/VALVE (MISCELLANEOUS) ×2 IMPLANT
CHLORAPREP W/TINT 26 (MISCELLANEOUS) ×2 IMPLANT
COVER TIP SHEARS 8 DVNC (MISCELLANEOUS) ×1 IMPLANT
COVER TIP SHEARS 8MM DA VINCI (MISCELLANEOUS) ×2
COVER WAND RF STERILE (DRAPES) ×4 IMPLANT
DEFOGGER SCOPE WARMER CLEARIFY (MISCELLANEOUS) ×2 IMPLANT
DERMABOND ADVANCED (GAUZE/BANDAGES/DRESSINGS) ×1
DERMABOND ADVANCED .7 DNX12 (GAUZE/BANDAGES/DRESSINGS) ×1 IMPLANT
DRAPE ARM DVNC X/XI (DISPOSABLE) ×3 IMPLANT
DRAPE COLUMN DVNC XI (DISPOSABLE) ×1 IMPLANT
DRAPE DA VINCI XI ARM (DISPOSABLE) ×6
DRAPE DA VINCI XI COLUMN (DISPOSABLE) ×2
ELECT REM PT RETURN 9FT ADLT (ELECTROSURGICAL) ×2
ELECTRODE REM PT RTRN 9FT ADLT (ELECTROSURGICAL) ×1 IMPLANT
GLOVE BIO SURGEON STRL SZ 6.5 (GLOVE) ×4 IMPLANT
GLOVE BIOGEL PI IND STRL 6.5 (GLOVE) ×2 IMPLANT
GLOVE BIOGEL PI INDICATOR 6.5 (GLOVE) ×2
GOWN STRL REUS W/ TWL LRG LVL3 (GOWN DISPOSABLE) ×3 IMPLANT
GOWN STRL REUS W/TWL LRG LVL3 (GOWN DISPOSABLE) ×6
IRRIGATOR SUCT 8 DISP DVNC XI (IRRIGATION / IRRIGATOR) IMPLANT
IRRIGATOR SUCTION 8MM XI DISP (IRRIGATION / IRRIGATOR)
IV NS 1000ML (IV SOLUTION)
IV NS 1000ML BAXH (IV SOLUTION) IMPLANT
KIT PINK PAD W/HEAD ARE REST (MISCELLANEOUS) ×2
KIT PINK PAD W/HEAD ARM REST (MISCELLANEOUS) ×1 IMPLANT
LABEL OR SOLS (LABEL) ×2 IMPLANT
MESH PROGRIP HERNIA FLAT 15X15 (Mesh General) ×1 IMPLANT
NDL INSUFFLATION 14GA 120MM (NEEDLE) ×1 IMPLANT
NEEDLE HYPO 22GX1.5 SAFETY (NEEDLE) ×2 IMPLANT
NEEDLE INSUFFLATION 14GA 120MM (NEEDLE) ×2 IMPLANT
OBTURATOR OPTICAL STANDARD 8MM (TROCAR) ×2
OBTURATOR OPTICAL STND 8 DVNC (TROCAR) ×1
OBTURATOR OPTICALSTD 8 DVNC (TROCAR) ×1 IMPLANT
PACK LAP CHOLECYSTECTOMY (MISCELLANEOUS) ×2 IMPLANT
SEAL CANN UNIV 5-8 DVNC XI (MISCELLANEOUS) ×3 IMPLANT
SEAL XI 5MM-8MM UNIVERSAL (MISCELLANEOUS) ×6
SET TUBE SMOKE EVAC HIGH FLOW (TUBING) ×2 IMPLANT
SOLUTION ELECTROLUBE (MISCELLANEOUS) ×2 IMPLANT
SUT MNCRL AB 4-0 PS2 18 (SUTURE) ×3 IMPLANT
SUT STRATAFIX PDS 30 CT-1 (SUTURE) ×2 IMPLANT
SUT VIC AB 3-0 SH 27 (SUTURE) ×2
SUT VIC AB 3-0 SH 27X BRD (SUTURE) IMPLANT
SUT VLOC 90 2/L VL 12 GS22 (SUTURE) ×4 IMPLANT
TAPE TRANSPORE STRL 2 31045 (GAUZE/BANDAGES/DRESSINGS) ×2 IMPLANT
TRAY FOLEY MTR SLVR 16FR STAT (SET/KITS/TRAYS/PACK) IMPLANT

## 2020-01-29 NOTE — Interval H&P Note (Signed)
History and Physical Interval Note:  01/29/2020 7:02 AM  [name]  has presented today for surgery, with the diagnosis of k43.2 Incisional hernia w/o obstruction or gangrene.  The various methods of treatment have been discussed with the patient and family. After consideration of risks, benefits and other options for treatment, the patient has consented to  Procedure(s): XI ROBOTIC ASSISTED VENTRAL HERNIA (N/A) as a surgical intervention.  The patient's history has been reviewed, patient examined, no change in status, stable for surgery.  I have reviewed the patient's chart and labs.  Questions were answered to the patient's satisfaction.     Carolan Shiver

## 2020-01-29 NOTE — Anesthesia Preprocedure Evaluation (Addendum)
Anesthesia Evaluation  Patient identified by MRN, date of birth, ID band Patient awake    Reviewed: Allergy & Precautions, H&P , NPO status , Patient's Chart, lab work & pertinent test results  History of Anesthesia Complications Negative for: history of anesthetic complications  Airway Mallampati: III  TM Distance: >3 FB     Dental  (+) Partial Upper   Pulmonary sleep apnea ("occasionally" uses CPAP) and Continuous Positive Airway Pressure Ventilation , neg COPD,    breath sounds clear to auscultation       Cardiovascular hypertension, (-) angina(-) Past MI and (-) Cardiac Stents (-) dysrhythmias  Rhythm:regular Rate:Normal     Neuro/Psych  Headaches, PSYCHIATRIC DISORDERS Anxiety Depression    GI/Hepatic Neg liver ROS, GERD  Controlled,H/o gastric sleeve   Endo/Other  Hypothyroidism   Renal/GU      Musculoskeletal   Abdominal   Peds  Hematology negative hematology ROS (+)   Anesthesia Other Findings Past Medical History: No date: Anxiety 01-2015: Cellulitis     Comment:  left arm No date: GERD (gastroesophageal reflux disease)     Comment:  H/O PRIOR TO WEIGHT LOSS No date: Headache 01/07/2015: HTN (hypertension)     Comment:  H/O/ GASTRIC SLEEVE /WT LOSS No date: Hypothyroidism     Comment:  not since weight loss 2014: MRSA infection     Comment:  h/o years ago No date: Renal insufficiency     Comment:  error in entry No date: Sleep apnea     Comment:  H/O PRIOR TO WEIGHT LOSS  Past Surgical History: 01/14/2018: CHOLECYSTECTOMY; N/A     Comment:  Procedure: LAPAROSCOPIC CHOLECYSTECTOMY;  Surgeon:               Ancil Linsey, MD;  Location: [facility] ORS;  Service:               General;  Laterality: N/A; 2015: COLONOSCOPY 2015: ESOPHAGOGASTRODUODENOSCOPY 2013: LAPAROSCOPIC GASTRIC SLEEVE RESECTION 02/06/2015: SHOULDER ARTHROSCOPY WITH OPEN ROTATOR CUFF REPAIR; Right     Comment:  Procedure: SHOULDER  ARTHROSCOPY WITH OPEN ROTATOR CUFF               REPAIR;  Surgeon: Juanell Fairly, MD;  Location: [facility]               ORS;  Service: Orthopedics;  Laterality: Right;     Reproductive/Obstetrics negative OB ROS                            Anesthesia Physical Anesthesia Plan  ASA: II  Anesthesia Plan: General ETT   Post-op Pain Management:    Induction:   PONV Risk Score and Plan: Ondansetron, Dexamethasone, Midazolam and Treatment may vary due to age or medical condition  Airway Management Planned:   Additional Equipment:   Intra-op Plan:   Post-operative Plan:   Informed Consent: I have reviewed the patients History and Physical, chart, labs and discussed the procedure including the risks, benefits and alternatives for the proposed anesthesia with the patient or authorized representative who has indicated his/her understanding and acceptance.     Dental Advisory Given  Plan Discussed with: Anesthesiologist, CRNA and Surgeon  Anesthesia Plan Comments:         Anesthesia Quick Evaluation

## 2020-01-29 NOTE — Discharge Instructions (Signed)
AMBULATORY SURGERY  DISCHARGE INSTRUCTIONS   1) The drugs that you were given will stay in your system until tomorrow so for the next 24 hours you should not:  A) Drive an automobile B) Make any legal decisions C) Drink any alcoholic beverage   2) You may resume regular meals tomorrow.  Today it is better to start with liquids and gradually work up to solid foods.  You may eat anything you prefer, but it is better to start with liquids, then soup and crackers, and gradually work up to solid foods.   3) Please notify your doctor immediately if you have any unusual bleeding, trouble breathing, redness and pain at the surgery site, drainage, fever, or pain not relieved by medication. 4)   5) Your post-operative visit with Dr.                                     is: Date:                        Time:    Please call to schedule your post-operative visit.  6) Additional Instructions:     Diet: Resume home heart healthy regular diet.   Activity: No heavy lifting >20 pounds (children, pets, laundry, garbage) or strenuous activity until follow-up, but light activity and walking are encouraged. Do not drive or drink alcohol if taking narcotic pain medications.  Wound care: May shower with soapy water and pat dry (do not rub incisions), but no baths or submerging incision underwater until follow-up. (no swimming)   Medications: Resume all home medications. For mild to moderate pain: acetaminophen (Tylenol) or ibuprofen (if no kidney disease). Combining Tylenol with alcohol can substantially increase your risk of causing liver disease. Narcotic pain medications, if prescribed, can be used for severe pain, though may cause nausea, constipation, and drowsiness. Do not combine Tylenol and Norco within a 6 hour period as Norco contains Tylenol. If you do not need the narcotic pain medication, you do not need to fill the prescription.  Call office ([phone]) at any time if any questions,  worsening pain, fevers/chills, bleeding, drainage from incision site, or other concerns.  

## 2020-01-29 NOTE — Transfer of Care (Signed)
Immediate Anesthesia Transfer of Care Note  Patient: COTTRELL GENTLES  Procedure(s) Performed: XI ROBOTIC ASSISTED INCISIONAL HERNIA REPAIR WITH MESH (N/A Abdomen)  Patient Location: PACU  Anesthesia Type:General  Level of Consciousness: awake, alert  and oriented  Airway & Oxygen Therapy: Patient Spontanous Breathing and Patient connected to face mask oxygen  Post-op Assessment: Report given to RN and Post -op Vital signs reviewed and stable  Post vital signs: Reviewed and stable  Last Vitals:  Vitals Value Taken Time  BP 133/71 01/29/20 1043  Temp    Pulse 59 01/29/20 1045  Resp 13 01/29/20 1045  SpO2 100 % 01/29/20 1045  Vitals shown include unvalidated device data.  Last Pain:  Vitals:   01/29/20 0625  PainSc: 0-No pain         Complications: No complications documented.

## 2020-01-29 NOTE — Anesthesia Postprocedure Evaluation (Signed)
Anesthesia Post Note  Patient: Trevor Torres  Procedure(s) Performed: XI ROBOTIC ASSISTED INCISIONAL HERNIA REPAIR WITH MESH (N/A Abdomen)  Patient location during evaluation: PACU Anesthesia Type: General Level of consciousness: awake and alert Pain management: pain level controlled Vital Signs Assessment: post-procedure vital signs reviewed and stable Respiratory status: spontaneous breathing, nonlabored ventilation and respiratory function stable Cardiovascular status: blood pressure returned to baseline and stable Postop Assessment: no apparent nausea or vomiting Anesthetic complications: yes (PONV) Comments: PONV despite Decadron and Zofran.  Consider scop patch and/or TIVA with next anesthetic.  KR   No complications documented.   Last Vitals:  Vitals:   01/29/20 1214 01/29/20 1218  BP: (!) 115/59 (!) 103/40  Pulse: (!) 53 64  Resp: 11 18  Temp: (!) 36.1 C 37.2 C  SpO2: 98% 100%    Last Pain:  Vitals:   01/29/20 1218  TempSrc: Temporal  PainSc: 6                  Aurelio Brash [place]

## 2020-01-29 NOTE — Anesthesia Procedure Notes (Signed)
Procedure Name: Intubation Date/Time: 01/29/2020 7:37 AM Performed by: Armanda Heritage, CRNA Pre-anesthesia Checklist: Patient identified, Timeout performed, Emergency Drugs available, Suction available and Patient being monitored Patient Re-evaluated:Patient Re-evaluated prior to induction Oxygen Delivery Method: Circle system utilized Preoxygenation: Pre-oxygenation with 100% oxygen Induction Type: IV induction Ventilation: Mask ventilation without difficulty Laryngoscope Size: McGraph and 4 Grade View: Grade I Tube size: 7.0 mm Number of attempts: 1 Airway Equipment and Method: Stylet Placement Confirmation: ETT inserted through vocal cords under direct vision,  positive ETCO2 and breath sounds checked- equal and bilateral Secured at: 22 cm Dental Injury: Teeth and Oropharynx as per pre-operative assessment

## 2020-01-29 NOTE — Op Note (Signed)
Preoperative diagnosis: Incisional Hernia  Postoperative diagnosis: Incisional Hernia  Procedure: Robotic assisted laparoscopic incisional hernia repair with mesh  Anesthesia: General  Surgeon: Dr. Hazle Quant  Wound Classification: Clean  Specimen: Non3  Complications: None  Estimated Blood Loss: 20ml  Indications: A 49 year old male with painful supraumbilical incisional hernia.   Findings: 1. A 1 cm supra umbilical hernia and a 1 cm epigastric hernia with incarcerated fat 2. Tension free repair achieved with 15 cm x 13 cm Progrip flat mesh and suture 3. Adequate hemostasis  Description of procedure: The patient was brought to the operating room and general anesthesia was induced. A time-out was completed verifying correct patient, procedure, site, positioning, and implant(s) and/or special equipment prior to beginning this procedure. Antibiotics were administered prior to making the incision. SCDs placed. The anterior abdominal wall was prepped and draped in the standard sterile fashion.   Palmer's point chosen for entry.  Veress needle placed and abdomen insufflated to 15cm without any dramatic increase in pressure.  Needle removed and optiview technique used to place 53mm port at same point.  No injury noted during placement. Two additional ports, 36mm x2 along left lateral aspect placed.  Xi robot then docked into place.  A pre preperitoneal flap was done starting 6 cm lateral to the hernia defect. The pre preperitoneal flap was created to fit a 15 cm AP 13 cm side to side Progrip mesh. Hernia contents noted and reduced with combination of blunt, sharp dissection with scissors and fenestrated forceps.  Hemostasis achieved throughout this portion.  Once all hernia contents reduced.  Insufflation dropped to 55mm and transfacial suture with 0 stratafix used to primarily close defects and the diastasis under minimal tension. A 15 cm x 13 cm Progrip mesh was placed within the abdominal  cavity.  The mesh was then opened auto fixating to the abdominal wall.  The preperitoneal flap was closed with 2-0 V-lock. The peritoneal defect created were closed.  Robot was undocked.  Abdomen then desufflated while camera within abdomen to ensure no signs of new bleed prior to removing camera and rest of ports completely.  All skin incisions closed with runninrg 4-0 Monocryl in a subcuticular fashion.  All wounds then dressed with Dermabond.  Patient was then successfully awakened and transferred to PACU in stable condition.  At the end of the procedure sponge and instrument counts were correct.

## 2020-05-23 ENCOUNTER — Emergency Department: Payer: Managed Care, Other (non HMO)

## 2020-05-23 ENCOUNTER — Other Ambulatory Visit: Payer: Self-pay

## 2020-05-23 ENCOUNTER — Emergency Department
Admission: EM | Admit: 2020-05-23 | Discharge: 2020-05-23 | Disposition: A | Discharge status: Home / Self Care | Payer: Managed Care, Other (non HMO) | Attending: Emergency Medicine | Admitting: Emergency Medicine

## 2020-05-23 DIAGNOSIS — E039 Hypothyroidism, unspecified: Secondary | ICD-10-CM | POA: Insufficient documentation

## 2020-05-23 DIAGNOSIS — I10 Essential (primary) hypertension: Secondary | ICD-10-CM | POA: Diagnosis not present

## 2020-05-23 DIAGNOSIS — R000 Tachycardia, unspecified: Secondary | ICD-10-CM | POA: Diagnosis present

## 2020-05-23 DIAGNOSIS — R0789 Other chest pain: Secondary | ICD-10-CM

## 2020-05-23 DIAGNOSIS — I471 Supraventricular tachycardia: Secondary | ICD-10-CM | POA: Diagnosis not present

## 2020-05-23 LAB — CBC WITH DIFFERENTIAL/PLATELET
Abs Immature Granulocytes: 0.03 10*3/uL (ref 0.00–0.07)
Basophils Absolute: 0.1 10*3/uL (ref 0.0–0.1)
Basophils Relative: 1 %
Eosinophils Absolute: 0.3 10*3/uL (ref 0.0–0.5)
Eosinophils Relative: 4 %
HCT: 38.2 % — ABNORMAL LOW (ref 39.0–52.0)
Hemoglobin: 12 g/dL — ABNORMAL LOW (ref 13.0–17.0)
Immature Granulocytes: 0 %
Lymphocytes Relative: 25 %
Lymphs Abs: 1.7 10*3/uL (ref 0.7–4.0)
MCH: 25.3 pg — ABNORMAL LOW (ref 26.0–34.0)
MCHC: 31.4 g/dL (ref 30.0–36.0)
MCV: 80.4 fL (ref 80.0–100.0)
Monocytes Absolute: 0.9 10*3/uL (ref 0.1–1.0)
Monocytes Relative: 13 %
Neutro Abs: 4 10*3/uL (ref 1.7–7.7)
Neutrophils Relative %: 57 %
Platelets: 390 10*3/uL (ref 150–400)
RBC: 4.75 MIL/uL (ref 4.22–5.81)
RDW: 14.9 % (ref 11.5–15.5)
WBC: 7 10*3/uL (ref 4.0–10.5)
nRBC: 0 % (ref 0.0–0.2)

## 2020-05-23 LAB — BASIC METABOLIC PANEL
Anion gap: 9 (ref 5–15)
BUN: 20 mg/dL (ref 6–20)
CO2: 24 mmol/L (ref 22–32)
Calcium: 8.8 mg/dL — ABNORMAL LOW (ref 8.9–10.3)
Chloride: 98 mmol/L (ref 98–111)
Creatinine, Ser: 0.98 mg/dL (ref 0.61–1.24)
GFR, Estimated: >60 mL/min (ref >60)
Glucose, Bld: 90 mg/dL (ref 70–99)
Potassium: 3.8 mmol/L (ref 3.5–5.1)
Sodium: 131 mmol/L — ABNORMAL LOW (ref 135–145)

## 2020-05-23 LAB — MAGNESIUM: Magnesium: 2.1 mg/dL (ref 1.7–2.4)

## 2020-05-23 LAB — TROPONIN I (HIGH SENSITIVITY): Troponin I (High Sensitivity): 6 ng/L (ref <18)

## 2020-05-23 MED ORDER — ADENOSINE 6 MG/2ML IV SOLN
INTRAVENOUS | Status: AC
  Filled 2020-05-23: qty 2

## 2020-05-23 MED ORDER — ADENOSINE 12 MG/4ML IV SOLN
INTRAVENOUS | Status: AC
  Filled 2020-05-23: qty 4

## 2020-05-23 NOTE — ED Notes (Signed)
MD at bedside for reeval

## 2020-05-23 NOTE — ED Notes (Signed)
Pt updated on POC. Warm blanket provided for comfort.

## 2020-05-23 NOTE — ED Notes (Signed)
Returned from XRAY

## 2020-05-23 NOTE — ED Provider Notes (Signed)
[facility] Emergency Department Provider Note ____________________________________________   Event Date/Time   First MD Initiated Contact with Patient 05/23/20 1434     (approximate)  I have reviewed the triage vital signs and the nursing notes.  HISTORY  Chief Complaint Tachycardia   HPI Trevor Torres is a 50 y.o. malewho presents to the ED for evaluation of tachycardia and chest discomfort.  Chart review indicates history of previous obesity s/p gastric sleeve.  Patient has no prescription medications and reports no recent illnesses. Patient reports 1-2 caffeinated beverages per day, but no further recreational drugs.  Patient reports being in his typical state of health until developing sudden onset chest discomfort and heart palpitations while in a seated position.  Denies emesis, diaphoresis, syncope, trauma, abdominal pain.  He reports his Apple Watch told him his heart rate was 168, and due to this he called 911.  EMS reports find the patient with heart rates 160s-170s, failing vagal maneuvers for them, and they provided 500 cc of normal saline.  Patient reports associated chest discomfort that is been constant for the past 1-2 hours in the setting of his palpitations.  Reports mild, 2/10, intensity chest pain across his chest.  Denies additional symptoms at this time.   Past Medical History:  Diagnosis Date  . Anxiety   . Cellulitis 01-2015   left arm  . GERD (gastroesophageal reflux disease)    H/O PRIOR TO WEIGHT LOSS  . Headache   . HTN (hypertension) 01/07/2015   H/O/ GASTRIC SLEEVE /WT LOSS  . Hypothyroidism    not since weight loss  . MRSA infection 2014   h/o years ago  . PONV (postoperative nausea and vomiting) 01/29/2020  . Renal insufficiency    error in entry  . Sleep apnea    H/O PRIOR TO WEIGHT LOSS    Patient Active Problem List   Diagnosis Date Noted  . PONV (postoperative nausea and vomiting) 01/29/2020  . Calculus  of gallbladder without cholecystitis without obstruction   . Shoulder pain 02/07/2015  . HTN (hypertension) 01/07/2015  . Vitamin B12 deficiency 01/03/2015  . 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 Major depressive disorder, recurrent, severe without psychotic behavior (HCC) 01/02/2015  . Hypothyroidism 01/02/2015  . Opioid use disorder, mild, abuse (HCC) 01/02/2015  . Rotator cuff injury 01/02/2015  . 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 CHOLECYSTECTOMY N/A 01/14/2018   Procedure: LAPAROSCOPIC CHOLECYSTECTOMY;  Surgeon: Ancil Linsey, MD;  Location: [facility] ORS;  Service: General;  Laterality: N/A;  . COLONOSCOPY  2015  . ESOPHAGOGASTRODUODENOSCOPY  2015  . LAPAROSCOPIC GASTRIC SLEEVE RESECTION  2013  . SHOULDER ARTHROSCOPY WITH OPEN ROTATOR CUFF REPAIR Right 02/06/2015   Procedure: SHOULDER ARTHROSCOPY WITH OPEN ROTATOR CUFF REPAIR;  Surgeon: Juanell Fairly, MD;  Location: [facility] ORS;  Service: Orthopedics;  Laterality: Right;  . XI ROBOTIC ASSISTED VENTRAL HERNIA N/A 01/29/2020   Procedure: XI ROBOTIC ASSISTED INCISIONAL HERNIA REPAIR WITH MESH;  Surgeon: Carolan Shiver, MD;  Location: [facility] ORS;  Service: General;  Laterality: N/A;    Prior to Admission medications   Medication Sig Start Date End Date Taking? Authorizing Provider  acetaminophen (TYLENOL) 500 MG tablet Take 500-1,000 mg by mouth every 6 (six) hours as needed (for pain.).    [provider]  Cholecalciferol (VITAMIN D3) 125 MCG (5000 UT) CAPS Take 1 capsule by mouth daily.  [provider]  Multiple Vitamin (MULTIVITAMIN WITH MINERALS) TABS tablet Take 1 tablet by mouth daily.    [provider]  ondansetron (ZOFRAN) 4 MG tablet Take 1 tablet (4 mg total) by mouth every 8 (eight) hours as needed for nausea or vomiting. 01/26/20   Herbert Pun, MD   zinc gluconate 50 MG tablet Take 50 mg by mouth daily.    [provider]    Allergies Nsaids  Family History  Problem Relation Age of Onset  . Diabetes Mother   . Hypertension Mother   . Cancer Mother   . Cancer Father     Social History Social History   Tobacco Use  . Smoking status: Never Smoker  . Smokeless tobacco: Never Used  Vaping Use  . Vaping Use: Never used  Substance Use Topics  . Alcohol use: Not Currently    Comment: states he quit drinkin  . Drug use: No    Review of Systems  Constitutional: No fever/chills Eyes: No visual changes. ENT: No sore throat. Cardiovascular: Positive for heart palpitations and chest pain. Respiratory: Denies shortness of breath. Gastrointestinal: No abdominal pain.  No nausea, no vomiting.  No diarrhea.  No constipation. Genitourinary: Negative for dysuria. Musculoskeletal: Negative for back pain. Skin: Negative for rash. Neurological: Negative for headaches, focal weakness or numbness.  ____________________________________________   PHYSICAL EXAM:  VITAL SIGNS: Vitals:   05/23/20 1432 05/23/20 1434  BP:    Pulse: 91   Resp: 18   Temp:  98.2 F (36.8 C)  SpO2: 100%      Constitutional: Alert and oriented.  in no acute distress.  Pleasant and conversational in full sentences, appears uncomfortable prior to conversion to sinus rhythm, and then looks improved. Eyes: Conjunctivae are normal. PERRL. EOMI. Head: Atraumatic. Nose: No congestion/rhinnorhea. Mouth/Throat: Mucous membranes are moist.  Oropharynx non-erythematous. Neck: No stridor. No cervical spine tenderness to palpation. Cardiovascular: Tachycardic rate, regular rhythm. Grossly normal heart sounds.  Good peripheral circulation. Respiratory: Normal respiratory effort.  No retractions. Lungs CTAB. Gastrointestinal: Soft , nondistended, nontender to palpation. No CVA tenderness. Musculoskeletal: No lower extremity tenderness nor edema.  No  joint effusions. No signs of acute trauma. Neurologic:  Normal speech and language. No gross focal neurologic deficits are appreciated. No gait instability noted. Skin:  Skin is warm, dry and intact. No rash noted. Psychiatric: Mood and affect are normal. Speech and behavior are normal.  ____________________________________________   LABS (all labs ordered are listed, but only abnormal results are displayed)  Labs Reviewed  CBC WITH DIFFERENTIAL/PLATELET - Abnormal; Notable for the following components:      Result Value   Hemoglobin 12.0 (*)    HCT 38.2 (*)    MCH 25.3 (*)    All other components within normal limits  BASIC METABOLIC PANEL - Abnormal; Notable for the following components:   Sodium 131 (*)    Calcium 8.8 (*)    All other components within normal limits  MAGNESIUM  TROPONIN I (HIGH SENSITIVITY)   ____________________________________________  12 Lead EKG  Initial twelve-lead EKG on arrival demonstrates an SVT with rate of 167 bpm.  Normal axis.  Expected intervals.  No evidence of acute ischemia.  Repeat EKG after vagal maneuvers demonstrate sinus rhythm, rate of 83 bpm.  Normal axis.  Short QTC of 3 to 36 ms.  3 PVCs.  No evidence of acute ischemia.  Intervals otherwise normal. ____________________________________________  RADIOLOGY  ED MD interpretation: 2 view CXR reviewed by me without  evidence of acute cardiopulmonary pathology.  Official radiology report(s): DG Chest 2 View  Result Date: 05/23/2020 CLINICAL DATA:  new onset SVT, chest pain EXAM: CHEST - 2 VIEW COMPARISON:  09/06/2014 and prior. FINDINGS: No focal consolidation. No pneumothorax or pleural effusion. Stable cardiomediastinal silhouette. No acute osseous abnormality. IMPRESSION: No focal airspace disease. Electronically Signed   By: Stana Bunting M.D.   On: 05/23/2020 15:30    ____________________________________________   PROCEDURES and INTERVENTIONS  Procedure(s) performed  (including Critical Care):  .1-3 Lead EKG Interpretation Performed by: Delton Prairie, MD Authorized by: Delton Prairie, MD     Interpretation: abnormal     ECG rate:  170   ECG rate assessment: tachycardic     Rhythm: SVT     Ectopy: none     Conduction: normal   .Critical Care Performed by: Delton Prairie, MD Authorized by: Delton Prairie, MD   Critical care provider statement:    Critical care time (minutes):  30   Critical care was necessary to treat or prevent imminent or life-threatening deterioration of the following conditions:  Cardiac failure and circulatory failure   Critical care was time spent personally by me on the following activities:  Discussions with consultants, evaluation of patient's response to treatment, examination of patient, ordering and performing treatments and interventions, ordering and review of laboratory studies, ordering and review of radiographic studies, pulse oximetry, re-evaluation of patient's condition, obtaining history from patient or surrogate and review of old charts    Medications  adenosine (ADENOCARD) 6 MG/2ML injection (has no administration in time range)  adenosine (ADENOCARD) 12 MG/4ML injection (has no administration in time range)    ____________________________________________   MDM / ED COURSE   50 year old male without relevant medical history presents to the ED with new onset SVT, resolved to sinus rhythm after modified vagal maneuvers.  Patient presents with heart rate in the 170s, normotensive.  Modified vagal maneuvers were successful in converting him to a sinus rhythm.  No evidence of ischemia on follow-up EKG and chest pain resolves with improvement of his rates.  Blood work to assess for electrolyte derangements pending at the time of signout to oncoming provider.  As long as there are no surprises or significant pathology is on his blood work, and no recurrence of SVT, anticipate patient will be suitable for outpatient  management with close cardiology follow-up.   Clinical Course as of 05/23/20 1534  Thu May 23, 2020  1434 Modified vagal maneuvers successful in converting to a sinus rhythm with rate 90-100.  Patient reports his symptoms have resolved with this. [DS]    Clinical Course User Index [DS] Delton Prairie, MD    ____________________________________________   FINAL CLINICAL IMPRESSION(S) / ED DIAGNOSES  Final diagnoses:  SVT (supraventricular tachycardia) (HCC)  Other chest pain     ED Discharge Orders    None       Krishika Bugge   Note:  This document was prepared using Dragon voice recognition software and may include unintentional dictation errors.   Delton Prairie, MD 05/23/20 1535

## 2020-05-23 NOTE — ED Triage Notes (Addendum)
Dr. Katrinka Blazing at bedside at this time.   Per EMS report, pt was sitting and felt a "funny" feeling in chest and noted HR of 168 on apple watch. Pt denies chest pain, SOB, nausea. Denies any cardiac hx. Pt given NS and attempted vagal maneuvers without change. Pt denies cardiac hx. Pt reports that he usually drinks 2 energy drinks daily and today had a cup of coffee and one energy drink.

## 2020-05-23 NOTE — Discharge Instructions (Signed)
You had SVT, overall your heart was beating remarkably fast, causing her discomfort and symptoms.  Thankfully, we were able to resolve this without medications.  Please follow-up with one of our local cardiologists, their numbers attached.  Please be seen in the next 1-2 weeks ideally.  If you develop any further worsening symptoms of chest pain, passing out or fevers, please return to the ED immediately.

## 2020-05-23 NOTE — ED Provider Notes (Signed)
-----------------------------------------   4:34 PM on 05/23/2020 -----------------------------------------  I took over care of this patient from Dr. Katrinka Blazing.  The patient has not been in the ED for shortly longer than 2 hours and has had no recurrent arrhythmia or other symptoms.  His lab work-up is reassuring.  He feels comfortable.  He is stable for discharge home at this time.  Cardiology referral has been provided.  Return precautions given, and he expresses understanding.   Dionne Bucy, MD 05/23/20 [phone]

## 2020-07-17 ENCOUNTER — Other Ambulatory Visit: Payer: Self-pay

## 2020-07-17 ENCOUNTER — Ambulatory Visit
Admission: EM | Admit: 2020-07-17 | Discharge: 2020-07-17 | Disposition: A | Discharge status: Home / Self Care | Payer: Managed Care, Other (non HMO) | Attending: Emergency Medicine | Admitting: Emergency Medicine

## 2020-07-17 DIAGNOSIS — M545 Low back pain, unspecified: Secondary | ICD-10-CM | POA: Diagnosis not present

## 2020-07-17 MED ORDER — IBUPROFEN 600 MG PO TABS: 600.0000 mg | ORAL_TABLET | Freq: Four times a day (QID) | ORAL | 0 refills | Status: AC | PRN

## 2020-07-17 MED ORDER — METHOCARBAMOL 500 MG PO TABS: 500.0000 mg | ORAL_TABLET | Freq: Two times a day (BID) | ORAL | 0 refills | Status: DC

## 2020-07-17 NOTE — ED Triage Notes (Signed)
Patient complains of low back pain x 10 days. States that pain has been gradually worsening and he has been unable to work recently secondary to pain. Denies any known injury.

## 2020-07-17 NOTE — Discharge Instructions (Addendum)
The ibuprofen with food and take it for the shortest course possible.  I do recommend taking it every 6 hours for 48 hours and then backing off to an as-needed basis.  Take the methocarbamol on the same every 6 hour schedule for the first 48 hours and then as needed.  Continue with your stretching and use moist heat.  As we discussed, massage therapy or using a foam roller can also be effective.  If your symptoms continue I recommend you follow-up with orthopedic urgent care.

## 2020-07-17 NOTE — ED Provider Notes (Signed)
[facility]    CSN: [id number] Arrival date & time: 07/17/20  0801      History   Chief Complaint Chief Complaint  Patient presents with  . Back Pain    HPI Trevor Torres is a 50 y.o. male.   HPI   50 year old male here for evaluation of low back pain.  Patient ports that he has been having pain for the last 10 days worsening over the last couple days.  Patient reports that is worsening to the point where it is affecting him at work.  Patient states that he just woke up with the pain and does not remember any precipitating event.  Patient does work in Opt[employer] at [employer] but does not remember any especially heavy lifting around the time the pain started.  Patient also denies injury.  Patient has been using Tylenol at home with minimal relief.  Patient reports that the pain is in his middle low back with some radiation to both sides.  Left is greater than right.  Patient reports that he does have intermittent numbness and tingling to the left leg but is not there at present.  Past Medical History:  Diagnosis Date  . Anxiety   . Cellulitis 01-2015   left arm  . GERD (gastroesophageal reflux disease)    H/O PRIOR TO WEIGHT LOSS  . Headache   . HTN (hypertension) 01/07/2015   H/O/ GASTRIC SLEEVE /WT LOSS  . Hypothyroidism    not since weight loss  . MRSA infection 2014   h/o years ago  . PONV (postoperative nausea and vomiting) 01/29/2020  . Renal insufficiency    error in entry  . Sleep apnea    H/O PRIOR TO WEIGHT LOSS    Patient Active Problem List   Diagnosis Date Noted  . PONV (postoperative nausea and vomiting) 01/29/2020  . Calculus of gallbladder without cholecystitis without obstruction   . Shoulder pain 02/07/2015  . HTN (hypertension) 01/07/2015  . Vitamin B12 deficiency 01/03/2015  . 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 Major depressive disorder, recurrent,  severe without psychotic behavior (HCC) 01/02/2015  . Hypothyroidism 01/02/2015  . Opioid use disorder, mild, abuse (HCC) 01/02/2015  . Rotator cuff injury 01/02/2015  . 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 CHOLECYSTECTOMY N/A 01/14/2018   Procedure: LAPAROSCOPIC CHOLECYSTECTOMY;  Surgeon: Ancil Linsey, MD;  Location: [facility] ORS;  Service: General;  Laterality: N/A;  . COLONOSCOPY  2015  . ESOPHAGOGASTRODUODENOSCOPY  2015  . LAPAROSCOPIC GASTRIC SLEEVE RESECTION  2013  . SHOULDER ARTHROSCOPY WITH OPEN ROTATOR CUFF REPAIR Right 02/06/2015   Procedure: SHOULDER ARTHROSCOPY WITH OPEN ROTATOR CUFF REPAIR;  Surgeon: Juanell Fairly, MD;  Location: [facility] ORS;  Service: Orthopedics;  Laterality: Right;  . XI ROBOTIC ASSISTED VENTRAL HERNIA N/A 01/29/2020   Procedure: XI ROBOTIC ASSISTED INCISIONAL HERNIA REPAIR WITH MESH;  Surgeon: Carolan Shiver, MD;  Location: [facility] ORS;  Service: General;  Laterality: N/A;       Home Medications    Prior to Admission medications   Medication Sig Start Date End Date Taking? Authorizing Provider  acetaminophen (TYLENOL) 500 MG tablet Take 500-1,000 mg by mouth every 6 (six) hours as needed (for pain.).   Yes [provider]  Cholecalciferol (VITAMIN D3) 125 MCG (5000 UT) CAPS Take 1 capsule by mouth daily.   Yes [provider]  ibuprofen (ADVIL) 600  MG tablet Take 1 tablet (600 mg total) by mouth every 6 (six) hours as needed. 07/17/20  Yes Becky Augusta, NP  methocarbamol (ROBAXIN) 500 MG tablet Take 1 tablet (500 mg total) by mouth 2 (two) times daily. 07/17/20  Yes Becky Augusta, NP  Multiple Vitamin (MULTIVITAMIN WITH MINERALS) TABS tablet Take 1 tablet by mouth daily.   Yes [provider]  zinc gluconate 50 MG tablet Take 50 mg by mouth daily.   Yes [provider]    Family History Family History  Problem Relation Age of Onset  . Diabetes Mother   .  Hypertension Mother   . Cancer Mother   . Cancer Father     Social History Social History   Tobacco Use  . Smoking status: Never Smoker  . Smokeless tobacco: Never Used  Vaping Use  . Vaping Use: Never used  Substance Use Topics  . Alcohol use: Not Currently    Comment: states he quit drinkin  . Drug use: No     Allergies   Nsaids   Review of Systems Review of Systems  Constitutional: Negative for activity change.  Musculoskeletal: Positive for arthralgias, back pain and myalgias.  Neurological: Positive for numbness. Negative for weakness.     Physical Exam Triage Vital Signs ED Triage Vitals  Enc Vitals Group     BP 07/17/20 0821 (!) 150/96     Pulse Rate 07/17/20 0821 66     Resp 07/17/20 0821 18     Temp 07/17/20 0821 98.2 F (36.8 C)     Temp Source 07/17/20 0821 Oral     SpO2 07/17/20 0821 100 %     Weight 07/17/20 0820 209 lb (94.8 kg)     Height 07/17/20 0820 5' 9.5" (1.765 m)     Head Circumference --      Peak Flow --      Pain Score 07/17/20 0820 7     Pain Loc --      Pain Edu? --      Excl. in GC? --    No data found.  Updated Vital Signs BP (!) 150/96 (BP Location: Left Arm)   Pulse 66   Temp 98.2 F (36.8 C) (Oral)   Resp 18   Ht 5' 9.5" (1.765 m)   Wt 209 lb (94.8 kg)   SpO2 100%   BMI 30.42 kg/m   Visual Acuity Right Eye Distance:   Left Eye Distance:   Bilateral Distance:    Right Eye Near:   Left Eye Near:    Bilateral Near:     Physical Exam Vitals and nursing note reviewed.  Constitutional:      General: He is not in acute distress.    Appearance: Normal appearance. He is not ill-appearing.  HENT:     Head: Normocephalic and atraumatic.  Cardiovascular:     Rate and Rhythm: Normal rate and regular rhythm.     Pulses: Normal pulses.     Heart sounds: Normal heart sounds. No murmur heard. No gallop.   Pulmonary:     Effort: Pulmonary effort is normal.     Breath sounds: Normal breath sounds. No wheezing,  rhonchi or rales.  Musculoskeletal:        General: Tenderness present. No swelling, deformity or signs of injury. Normal range of motion.  Skin:    General: Skin is warm.     Capillary Refill: Capillary refill takes less than 2 seconds.     Findings: No  bruising or erythema.  Neurological:     General: No focal deficit present.     Mental Status: He is alert and oriented to person, place, and time.     Sensory: No sensory deficit.     Motor: No weakness.     Deep Tendon Reflexes: Reflexes normal.  Psychiatric:        Mood and Affect: Mood normal.        Behavior: Behavior normal.        Thought Content: Thought content normal.        Judgment: Judgment normal.      UC Treatments / Results  Labs (all labs ordered are listed, but only abnormal results are displayed) Labs Reviewed - No data to display  EKG   Radiology No results found.  Procedures Procedures (including critical care time)  Medications Ordered in UC Medications - No data to display  Initial Impression / Assessment and Plan / UC Course  I have reviewed the triage vital signs and the nursing notes.  Pertinent labs & imaging results that were available during my care of the patient were reviewed by me and considered in my medical decision making (see chart for details).   Patient is here for evaluation of low back pain that has been worsening over the last 10 days.  Patient does appear to be in some level of discomfort but he is not in any acute distress.  Physical exam reveals 5/5 lower extremity strength bilaterally and 1+ patellar and Achilles DTRs bilaterally.  Patient does have tenderness over the SI joints with the right being greater than the left.  Patient has full range of motion.  Patient's exam is consistent with inflammation of the posterior superior sacroiliac ligament bilaterally.  We will treat patient with ibuprofen 600 mg, methocarbamol 1000 mg, moist heat, stretches, and I suggested using a  foam roller or getting a massage.  Patient has had a gastric sleeve but secondary to his polysubstance use disorder documented in the record we will treat with a short course of NSAIDs.  Patient has some listed as an allergy but it is due to his gastric sleeve surgery and not a true allergy.   Final Clinical Impressions(s) / UC Diagnoses   Final diagnoses:  Acute midline low back pain without sciatica     Discharge Instructions     The ibuprofen with food and take it for the shortest course possible.  I do recommend taking it every 6 hours for 48 hours and then backing off to an as-needed basis.  Take the methocarbamol on the same every 6 hour schedule for the first 48 hours and then as needed.  Continue with your stretching and use moist heat.  As we discussed, massage therapy or using a foam roller can also be effective.  If your symptoms continue I recommend you follow-up with orthopedic urgent care.    ED Prescriptions    Medication Sig Dispense Auth. Provider   ibuprofen (ADVIL) 600 MG tablet Take 1 tablet (600 mg total) by mouth every 6 (six) hours as needed. 30 tablet Becky Augusta, NP   methocarbamol (ROBAXIN) 500 MG tablet Take 1 tablet (500 mg total) by mouth 2 (two) times daily. 20 tablet Becky Augusta, NP     I have reviewed the PDMP during this encounter.   Becky Augusta, NP 07/17/20 [phone]

## 2020-10-01 ENCOUNTER — Observation Stay: Payer: Managed Care, Other (non HMO)

## 2020-10-01 ENCOUNTER — Other Ambulatory Visit: Payer: Self-pay

## 2020-10-01 ENCOUNTER — Inpatient Hospital Stay
Admission: EM | Admit: 2020-10-01 | Discharge: 2020-10-04 | DRG: 392 | Disposition: A | Discharge status: Home / Self Care | Payer: Managed Care, Other (non HMO) | Attending: Internal Medicine | Admitting: Internal Medicine

## 2020-10-01 DIAGNOSIS — R55 Syncope and collapse: Secondary | ICD-10-CM

## 2020-10-01 DIAGNOSIS — F419 Anxiety disorder, unspecified: Secondary | ICD-10-CM | POA: Diagnosis present

## 2020-10-01 DIAGNOSIS — R112 Nausea with vomiting, unspecified: Principal | ICD-10-CM | POA: Diagnosis present

## 2020-10-01 DIAGNOSIS — K319 Disease of stomach and duodenum, unspecified: Secondary | ICD-10-CM

## 2020-10-01 DIAGNOSIS — Z20822 Contact with and (suspected) exposure to covid-19: Secondary | ICD-10-CM | POA: Diagnosis present

## 2020-10-01 DIAGNOSIS — K219 Gastro-esophageal reflux disease without esophagitis: Secondary | ICD-10-CM | POA: Diagnosis present

## 2020-10-01 DIAGNOSIS — I10 Essential (primary) hypertension: Secondary | ICD-10-CM | POA: Diagnosis present

## 2020-10-01 DIAGNOSIS — R531 Weakness: Secondary | ICD-10-CM

## 2020-10-01 DIAGNOSIS — G4700 Insomnia, unspecified: Secondary | ICD-10-CM | POA: Diagnosis present

## 2020-10-01 DIAGNOSIS — R1319 Other dysphagia: Secondary | ICD-10-CM

## 2020-10-01 DIAGNOSIS — R0789 Other chest pain: Secondary | ICD-10-CM | POA: Diagnosis present

## 2020-10-01 DIAGNOSIS — Z9049 Acquired absence of other specified parts of digestive tract: Secondary | ICD-10-CM

## 2020-10-01 DIAGNOSIS — R109 Unspecified abdominal pain: Secondary | ICD-10-CM

## 2020-10-01 DIAGNOSIS — D509 Iron deficiency anemia, unspecified: Secondary | ICD-10-CM | POA: Diagnosis present

## 2020-10-01 DIAGNOSIS — D75839 Thrombocytosis, unspecified: Secondary | ICD-10-CM | POA: Diagnosis present

## 2020-10-01 DIAGNOSIS — E039 Hypothyroidism, unspecified: Secondary | ICD-10-CM | POA: Diagnosis present

## 2020-10-01 DIAGNOSIS — Z79899 Other long term (current) drug therapy: Secondary | ICD-10-CM

## 2020-10-01 DIAGNOSIS — E871 Hypo-osmolality and hyponatremia: Secondary | ICD-10-CM

## 2020-10-01 DIAGNOSIS — Z886 Allergy status to analgesic agent status: Secondary | ICD-10-CM

## 2020-10-01 DIAGNOSIS — G4730 Sleep apnea, unspecified: Secondary | ICD-10-CM | POA: Diagnosis present

## 2020-10-01 DIAGNOSIS — Z8249 Family history of ischemic heart disease and other diseases of the circulatory system: Secondary | ICD-10-CM

## 2020-10-01 DIAGNOSIS — K648 Other hemorrhoids: Secondary | ICD-10-CM | POA: Diagnosis present

## 2020-10-01 DIAGNOSIS — R1110 Vomiting, unspecified: Secondary | ICD-10-CM

## 2020-10-01 DIAGNOSIS — E538 Deficiency of other specified B group vitamins: Secondary | ICD-10-CM | POA: Diagnosis present

## 2020-10-01 DIAGNOSIS — Z9884 Bariatric surgery status: Secondary | ICD-10-CM

## 2020-10-01 DIAGNOSIS — R1314 Dysphagia, pharyngoesophageal phase: Secondary | ICD-10-CM | POA: Diagnosis present

## 2020-10-01 DIAGNOSIS — Z8614 Personal history of Methicillin resistant Staphylococcus aureus infection: Secondary | ICD-10-CM

## 2020-10-01 LAB — URINALYSIS, COMPLETE (UACMP) WITH MICROSCOPIC
Bacteria, UA: NONE SEEN
Bilirubin Urine: NEGATIVE
Glucose, UA: NEGATIVE mg/dL
Hgb urine dipstick: NEGATIVE
Ketones, ur: NEGATIVE mg/dL
Leukocytes,Ua: NEGATIVE
Nitrite: NEGATIVE
Protein, ur: NEGATIVE mg/dL
Specific Gravity, Urine: 1.006 (ref 1.005–1.030)
Squamous Epithelial / HPF: NONE SEEN (ref 0–5)
pH: 6 (ref 5.0–8.0)

## 2020-10-01 LAB — URINE DRUG SCREEN, QUALITATIVE (ARMC ONLY)
Amphetamines, Ur Screen: NOT DETECTED
Barbiturates, Ur Screen: NOT DETECTED
Benzodiazepine, Ur Scrn: NOT DETECTED
Cannabinoid 50 Ng, Ur ~~LOC~~: NOT DETECTED
Cocaine Metabolite,Ur ~~LOC~~: NOT DETECTED
MDMA (Ecstasy)Ur Screen: NOT DETECTED
Methadone Scn, Ur: NOT DETECTED
Opiate, Ur Screen: NOT DETECTED
Phencyclidine (PCP) Ur S: NOT DETECTED
Tricyclic, Ur Screen: NOT DETECTED

## 2020-10-01 LAB — COMPREHENSIVE METABOLIC PANEL
ALT: 37 U/L (ref 0–44)
AST: 42 U/L — ABNORMAL HIGH (ref 15–41)
Albumin: 4.3 g/dL (ref 3.5–5.0)
Alkaline Phosphatase: 37 U/L — ABNORMAL LOW (ref 38–126)
Anion gap: 7 (ref 5–15)
BUN: 19 mg/dL (ref 6–20)
CO2: 24 mmol/L (ref 22–32)
Calcium: 9.2 mg/dL (ref 8.9–10.3)
Chloride: 94 mmol/L — ABNORMAL LOW (ref 98–111)
Creatinine, Ser: 0.99 mg/dL (ref 0.61–1.24)
GFR, Estimated: >60 mL/min (ref >60)
Glucose, Bld: 89 mg/dL (ref 70–99)
Potassium: 4.4 mmol/L (ref 3.5–5.1)
Sodium: 125 mmol/L — ABNORMAL LOW (ref 135–145)
Total Bilirubin: 0.9 mg/dL (ref 0.3–1.2)
Total Protein: 7 g/dL (ref 6.5–8.1)

## 2020-10-01 LAB — CBC
HCT: 33.9 % — ABNORMAL LOW (ref 39.0–52.0)
Hemoglobin: 10.6 g/dL — ABNORMAL LOW (ref 13.0–17.0)
MCH: 22.2 pg — ABNORMAL LOW (ref 26.0–34.0)
MCHC: 31.3 g/dL (ref 30.0–36.0)
MCV: 71.1 fL — ABNORMAL LOW (ref 80.0–100.0)
Platelets: 462 10*3/uL — ABNORMAL HIGH (ref 150–400)
RBC: 4.77 MIL/uL (ref 4.22–5.81)
RDW: 16.5 % — ABNORMAL HIGH (ref 11.5–15.5)
WBC: 5.6 10*3/uL (ref 4.0–10.5)
nRBC: 0 % (ref 0.0–0.2)

## 2020-10-01 LAB — TROPONIN I (HIGH SENSITIVITY)
Troponin I (High Sensitivity): 4 ng/L (ref <18)
Troponin I (High Sensitivity): 2 ng/L (ref <18)

## 2020-10-01 LAB — IRON AND TIBC
Iron: 16 ug/dL — ABNORMAL LOW (ref 45–182)
Saturation Ratios: 4 % — ABNORMAL LOW (ref 17.9–39.5)
TIBC: 427 ug/dL (ref 250–450)
UIBC: 411 ug/dL

## 2020-10-01 LAB — TSH: TSH: 4.896 u[IU]/mL — ABNORMAL HIGH (ref 0.350–4.500)

## 2020-10-01 LAB — RETICULOCYTES
Immature Retic Fract: 22.1 % — ABNORMAL HIGH (ref 2.3–15.9)
RBC.: 4.74 MIL/uL (ref 4.22–5.81)
Retic Count, Absolute: 59.6 10*3/uL (ref 19.0–186.0)
Retic Ct Pct: 1.3 % (ref 0.4–3.1)

## 2020-10-01 LAB — D-DIMER, QUANTITATIVE: D-Dimer, Quant: 0.46 ug/mL-FEU (ref 0.00–0.50)

## 2020-10-01 LAB — RESP PANEL BY RT-PCR (FLU A&B, COVID) ARPGX2
Influenza A by PCR: NEGATIVE
Influenza B by PCR: NEGATIVE
SARS Coronavirus 2 by RT PCR: NEGATIVE

## 2020-10-01 LAB — FERRITIN: Ferritin: 9 ng/mL — ABNORMAL LOW (ref 24–336)

## 2020-10-01 LAB — SODIUM, URINE, RANDOM: Sodium, Ur: 21 mmol/L

## 2020-10-01 LAB — FOLATE: Folate: 29 ng/mL (ref >5.9)

## 2020-10-01 LAB — OSMOLALITY, URINE: Osmolality, Ur: 276 mOsm/kg — ABNORMAL LOW (ref 300–900)

## 2020-10-01 MED ORDER — DICYCLOMINE HCL 20 MG PO TABS
20.0000 mg | ORAL_TABLET | Freq: Three times a day (TID) | ORAL | Status: DC | PRN
  Administered 2020-10-01: 20 mg via ORAL
  Filled 2020-10-01 (×2): qty 1

## 2020-10-01 MED ORDER — SODIUM CHLORIDE 0.9 % IV BOLUS
1000.0000 mL | Freq: Once | INTRAVENOUS | Status: AC
  Administered 2020-10-01: 1000 mL via INTRAVENOUS

## 2020-10-01 MED ORDER — ACETAMINOPHEN 500 MG PO TABS: 500.0000 mg | ORAL_TABLET | Freq: Four times a day (QID) | ORAL | Status: DC | PRN

## 2020-10-01 MED ORDER — PANTOPRAZOLE SODIUM 40 MG IV SOLR
40.0000 mg | Freq: Two times a day (BID) | INTRAVENOUS | Status: DC
  Administered 2020-10-01 – 2020-10-04 (×5): 40 mg via INTRAVENOUS
  Filled 2020-10-01 (×5): qty 40

## 2020-10-01 MED ORDER — SODIUM CHLORIDE 0.9 % IV SOLN: INTRAVENOUS | Status: AC

## 2020-10-01 MED ORDER — ONDANSETRON HCL 4 MG/2ML IJ SOLN
4.0000 mg | Freq: Four times a day (QID) | INTRAMUSCULAR | Status: DC | PRN
  Administered 2020-10-01 – 2020-10-04 (×4): 4 mg via INTRAVENOUS
  Filled 2020-10-01 (×4): qty 2

## 2020-10-01 MED ORDER — VITAMIN D3 25 MCG (1000 UNIT) PO TABS
5000.0000 [IU] | ORAL_TABLET | Freq: Every day | ORAL | Status: DC
  Administered 2020-10-01 – 2020-10-04 (×3): 5000 [IU] via ORAL
  Filled 2020-10-01 (×7): qty 5

## 2020-10-01 NOTE — H&P (Addendum)
History and Physical    KISHAUN EREKSON WVP:[id number] DOB: Dec 02, 1970 DOA: 10/01/2020  Referring MD/NP/PA: EDP PCP:  Patient coming from: Work  Chief Complaint: Weakness, near syncope  HPI: GERARDO CAIAZZO is a42 year old male with history of anxiety, gastric reflux, history of cholecystectomy in 2019, hernia repair in 01/2020, remote history of gastric sleeve 6 to 8 years ago presented to the ED with weakness and near syncopal episode. -He reports feeling weak for the last 2 days which he attributed to his job, today continued to feel weak and nauseous, associated with episode where he felt he was going to pass out, subsequently went to nurses office at his workplace as blood pressure was noted to be unusually high at 180.  He was then asked to come to the emergency room -While in the ED started to experience nausea and epigastric abdominal pain and lower abdominal cramps, no vomiting, denies any diarrhea. 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   Past Medical History:  Diagnosis Date  . Anxiety   . Cellulitis 01-2015   left arm  . GERD (gastroesophageal reflux disease)    H/O PRIOR TO WEIGHT LOSS  . Headache   . HTN (hypertension) 01/07/2015   H/O/ GASTRIC SLEEVE /WT LOSS  . Hypothyroidism    not since weight loss  . MRSA infection 2014   h/o years ago  . PONV (postoperative nausea and vomiting) 01/29/2020  . Renal insufficiency    error in entry  . Sleep apnea    H/O PRIOR TO WEIGHT LOSS    Past Surgical History:  Procedure Laterality Date  . CHOLECYSTECTOMY N/A 01/14/2018    Procedure: LAPAROSCOPIC CHOLECYSTECTOMY;  Surgeon: Ancil Linsey, MD;  Location: [facility] ORS;  Service: General;  Laterality: N/A;  . COLONOSCOPY  2015  . ESOPHAGOGASTRODUODENOSCOPY  2015  . LAPAROSCOPIC GASTRIC SLEEVE RESECTION  2013  . SHOULDER ARTHROSCOPY WITH OPEN ROTATOR CUFF REPAIR Right 02/06/2015   Procedure: SHOULDER ARTHROSCOPY WITH OPEN ROTATOR CUFF REPAIR;  Surgeon: Juanell Fairly, MD;  Location: [facility] ORS;  Service: Orthopedics;  Laterality: Right;  . XI ROBOTIC ASSISTED VENTRAL HERNIA N/A 01/29/2020   Procedure: XI ROBOTIC ASSISTED INCISIONAL HERNIA REPAIR WITH MESH;  Surgeon: Carolan Shiver, MD;  Location: [facility] ORS;  Service: General;  Laterality: N/A;     reports that he has never smoked. He has never used smokeless tobacco. He reports previous alcohol use. He reports that he does not use drugs.  Allergies  Allergen Reactions  . Nsaids Other (See Comments)    Pt is unable to take due to gastric sleeve surgery.    Family History  Problem Relation Age of Onset  . Diabetes Mother   . Hypertension Mother   . Cancer Mother   . Cancer Father      Prior to Admission medications   Medication Sig Start Date End Date Taking? Authorizing Provider  acetaminophen (TYLENOL) 500 MG tablet Take 500-1,000 mg by mouth every 6 (six) hours as needed (for pain.).    [provider]  Cholecalciferol (VITAMIN D3) 125 MCG (5000 UT) CAPS Take 1 capsule by mouth daily.  [provider]  ibuprofen (ADVIL) 600 MG tablet Take 1 tablet (600 mg total) by mouth every 6 (six) hours as needed. 07/17/20   Becky Augusta, NP  methocarbamol (ROBAXIN) 500 MG tablet Take 1 tablet (500 mg total) by mouth 2 (two) times daily. 07/17/20   Becky Augusta, NP  Multiple Vitamin (MULTIVITAMIN WITH MINERALS) TABS tablet Take 1 tablet by mouth daily.    [provider]  zinc gluconate 50 MG tablet Take 50 mg by mouth daily.    [provider]    Physical Exam: Vitals:    10/01/20 1222 10/01/20 1300 10/01/20 1400 10/01/20 1500  BP:  134/88 (!) 144/93 130/85  Pulse:  68 68 87  Resp:  11 (!) 25 12  Temp:      TempSrc:      SpO2:  99% 100% 100%  Weight: 91.6 kg     Height: 5\' 9"  (1.753 m)         Constitutional: Well built muscular male laying in bed, uncomfortable appearing, AAOx3 Vitals:   10/01/20 1222 10/01/20 1300 10/01/20 1400 10/01/20 1500  BP:  134/88 (!) 144/93 130/85  Pulse:  68 68 87  Resp:  11 (!) 25 12  Temp:      TempSrc:      SpO2:  99% 100% 100%  Weight: 91.6 kg     Height: 5\' 9"  (1.753 m)      HEENT: Pupils equal and reactive, oral mucosa is dry CVS: S1-S2, regular rate rhythm Lungs: Clear bilaterally Abdomen: Soft, mild epigastric and left-sided tenderness Extremities: No edema Skin: No rashes on exposed skin Neuro: Moves all extremities, no localizing signs  Labs on Admission: I have personally reviewed following labs and imaging studies  CBC: Recent Labs  Lab 10/01/20 1225  WBC 5.6  HGB 10.6*  HCT 33.9*  MCV 71.1*  PLT 462*   Basic Metabolic Panel: Recent Labs  Lab 10/01/20 1225  NA 125*  K 4.4  CL 94*  CO2 24  GLUCOSE 89  BUN 19  CREATININE 0.99  CALCIUM 9.2   GFR: Estimated Creatinine Clearance: 103.2 mL/min (by C-G formula based on SCr of 0.99 mg/dL). Liver Function Tests: Recent Labs  Lab 10/01/20 1225  AST 42*  ALT 37  ALKPHOS 37*  BILITOT 0.9  PROT 7.0  ALBUMIN 4.3   No results for input(s): LIPASE, AMYLASE in the last 168 hours. No results for input(s): AMMONIA in the last 168 hours. Coagulation Profile: No results for input(s): INR, PROTIME in the last 168 hours. Cardiac Enzymes: No results for input(s): CKTOTAL, CKMB, CKMBINDEX, TROPONINI in the last 168 hours. BNP (last 3 results) No results for input(s): PROBNP in the last 8760 hours. HbA1C: No results for input(s): HGBA1C in the last 72 hours. CBG: No results for input(s): GLUCAP in the last 168 hours. Lipid Profile: No  results for input(s): CHOL, HDL, LDLCALC, TRIG, CHOLHDL, LDLDIRECT in the last 72 hours. Thyroid Function Tests: No results for input(s): TSH, T4TOTAL, FREET4, T3FREE, THYROIDAB in the last 72 hours. Anemia Panel: No results for input(s): VITAMINB12, FOLATE, FERRITIN, TIBC, IRON, RETICCTPCT in the last 72 hours. Urine analysis:    Component Value Date/Time   COLORURINE STRAW (A) 10/01/2020 1357   APPEARANCEUR CLEAR (A) 10/01/2020 1357   APPEARANCEUR Clear 11/29/2013 1233   LABSPEC 1.006 10/01/2020 1357   LABSPEC 1.011 11/29/2013 1233   PHURINE 6.0 10/01/2020 1357   GLUCOSEU NEGATIVE 10/01/2020 1357   GLUCOSEU Negative 11/29/2013 1233   HGBUR  NEGATIVE 10/01/2020 1357   BILIRUBINUR NEGATIVE 10/01/2020 1357   BILIRUBINUR Negative 11/29/2013 1233   KETONESUR NEGATIVE 10/01/2020 1357   PROTEINUR NEGATIVE 10/01/2020 1357   UROBILINOGEN 1.0 09/06/2014 1204   NITRITE NEGATIVE 10/01/2020 1357   LEUKOCYTESUR NEGATIVE 10/01/2020 1357   LEUKOCYTESUR Negative 11/29/2013 1233   Sepsis Labs: @LABRCNTIP (procalcitonin:4,lacticidven:4) ) Recent Results (from the past 240 hour(s))  Resp Panel by RT-PCR (Flu A&B, Covid) Nasopharyngeal Swab     Status: None   Collection Time: 10/01/20  1:56 PM   Specimen: Nasopharyngeal Swab; Nasopharyngeal(NP) swabs in vial transport medium  Result Value Ref Range Status   SARS Coronavirus 2 by RT PCR NEGATIVE NEGATIVE Final    Comment: (NOTE) SARS-CoV-2 target nucleic acids are NOT DETECTED.  The SARS-CoV-2 RNA is generally detectable in upper respiratory specimens during the acute phase of infection. The lowest concentration of SARS-CoV-2 viral copies this assay can detect is 138 copies/mL. A negative result does not preclude SARS-Cov-2 infection and should not be used as the sole basis for treatment or other patient management decisions. A negative result may occur with  improper specimen collection/handling, submission of specimen other than  nasopharyngeal swab, presence of viral mutation(s) within the areas targeted by this assay, and inadequate number of viral copies(<138 copies/mL). A negative result must be combined with clinical observations, patient history, and epidemiological information. The expected result is Negative.  Fact Sheet for Patients:  BloggerCourse.comhttps://www.fda.gov/media/152166/download  Fact Sheet for Healthcare Providers:  SeriousBroker.ithttps://www.fda.gov/media/152162/download  This test is no t yet approved or cleared by the [place] FDA and  has been authorized for detection and/or diagnosis of SARS-CoV-2 by FDA under an Emergency Use Authorization (EUA). This EUA will remain  in effect (meaning this test can be used) for the duration of the COVID-19 declaration under Section 564(b)(1) of the Act, 21 U.S.C.section 360bbb-3(b)(1), unless the authorization is terminated  or revoked sooner.       Influenza A by PCR NEGATIVE NEGATIVE Final   Influenza B by PCR NEGATIVE NEGATIVE Final    Comment: (NOTE) The Xpert Xpress SARS-CoV-2/FLU/RSV plus assay is intended as an aid in the diagnosis of influenza from Nasopharyngeal swab specimens and should not be used as a sole basis for treatment. Nasal washings and aspirates are unacceptable for Xpert Xpress SARS-CoV-2/FLU/RSV testing.  Fact Sheet for Patients: BloggerCourse.comhttps://www.fda.gov/media/152166/download  Fact Sheet for Healthcare Providers: SeriousBroker.ithttps://www.fda.gov/media/152162/download  This test is not yet approved or cleared by the [place] FDA and has been authorized for detection and/or diagnosis of SARS-CoV-2 by FDA under an Emergency Use Authorization (EUA). This EUA will remain in effect (meaning this test can be used) for the duration of the COVID-19 declaration under Section 564(b)(1) of the Act, 21 U.S.C. section 360bbb-3(b)(1), unless the authorization is terminated or revoked.  Performed at [facility], [address] [place], [place]  [id number]      Radiological Exams on Admission: No results found.  EKG: Independently reviewed.  Normal sinus rhythm, no acute ST-T wave changes  Assessment/Plan Active Problems:  Near syncope Chest discomfort -EKG not suggestive of ACS, high-sensitivity troponin is negative will repeat -Check D-dimer and 2D echocardiogram -Monitor on telemetry, check UDS -If above work-up is negative his symptoms could be secondary to anemia, PUD  Microcytic anemia Epigastric, left-sided abdominal pain and tenderness -History of GERD, intermittent NSAID use and history of gastric sleeve -Check anemia panel, patient does not look know if he could have had dark stools or melena, Hemoccult stool -Add IV PPI -CT  abdomen with oral contrast, complains of epigastric And left-sided abdominal pain with cramps  Hyponatremia -Could be secondary to above, check urine sodium, osmolarity -Hydrate with normal saline today  History of gastric sleeve -Check B12, folate   DVT prophylaxis: SCDs Code Status: Full code Family Communication: Discussed with patient in detail, no family at bedside Disposition Plan: Home tomorrow if above work-up is unremarkable Consults called: None Admission status: Observation  Zannie Cove MD [facility]   10/01/2020, 4:01 PM

## 2020-10-01 NOTE — ED Notes (Addendum)
Pt presents to ED via EMS with c/o of having a near syncope episode at work which lead him to go to his workplaces nurses office for a BP check which was high, pt states he feels weak all over and not unilaterally. Pt denies feeling ill or having fevers or chills. Pt has equal bilateral grips and appears neurologically intact at this time.   Pt states he feels as if the room is spinning and is having slight CP, pt states "most everything is back to normal now".   Pt denies SOB. Pt is A&Ox4.   Pt states HX of tachycardia.

## 2020-10-01 NOTE — ED Provider Notes (Signed)
[facility] Emergency Department Provider Note  Time seen: 12:51 PM  I have reviewed the triage vital signs and the nursing notes.   HISTORY  Chief Complaint Weakness and Near Syncope   HPI Trevor Torres is a 50 y.o. male with a past medical history of anxiety, gastric reflux, hypertension, presents to the emergency department for generalized weakness and high blood pressure.  According to the patient he was at work today when he was feeling weak, states he has been feeling this way over the past several days.  He went to the nurses station had his blood pressure checked that showed it was hypertensive so the patient came to the emergency department for evaluation.  Patient denies any focal weakness.  No fever.  Patient does state some mild chest discomfort intermittent over the past few days but none currently.  No cough or shortness of breath.  No known sick contacts.   Past Medical History:  Diagnosis Date  . Anxiety   . Cellulitis 01-2015   left arm  . GERD (gastroesophageal reflux disease)    H/O PRIOR TO WEIGHT LOSS  . Headache   . HTN (hypertension) 01/07/2015   H/O/ GASTRIC SLEEVE /WT LOSS  . Hypothyroidism    not since weight loss  . MRSA infection 2014   h/o years ago  . PONV (postoperative nausea and vomiting) 01/29/2020  . Renal insufficiency    error in entry  . Sleep apnea    H/O PRIOR TO WEIGHT LOSS    Patient Active Problem List   Diagnosis Date Noted  . PONV (postoperative nausea and vomiting) 01/29/2020  . Calculus of gallbladder without cholecystitis without obstruction   . Shoulder pain 02/07/2015  . HTN (hypertension) 01/07/2015  . Vitamin B12 deficiency 01/03/2015  . 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 Major depressive disorder, recurrent, severe without psychotic behavior (HCC) 01/02/2015  . Hypothyroidism 01/02/2015  . Opioid use disorder,  mild, abuse (HCC) 01/02/2015  . Rotator cuff injury 01/02/2015  . 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 CHOLECYSTECTOMY N/A 01/14/2018   Procedure: LAPAROSCOPIC CHOLECYSTECTOMY;  Surgeon: Ancil Linsey, MD;  Location: [facility] ORS;  Service: General;  Laterality: N/A;  . COLONOSCOPY  2015  . ESOPHAGOGASTRODUODENOSCOPY  2015  . LAPAROSCOPIC GASTRIC SLEEVE RESECTION  2013  . SHOULDER ARTHROSCOPY WITH OPEN ROTATOR CUFF REPAIR Right 02/06/2015   Procedure: SHOULDER ARTHROSCOPY WITH OPEN ROTATOR CUFF REPAIR;  Surgeon: Juanell Fairly, MD;  Location: [facility] ORS;  Service: Orthopedics;  Laterality: Right;  . XI ROBOTIC ASSISTED VENTRAL HERNIA N/A 01/29/2020   Procedure: XI ROBOTIC ASSISTED INCISIONAL HERNIA REPAIR WITH MESH;  Surgeon: Carolan Shiver, MD;  Location: [facility] ORS;  Service: General;  Laterality: N/A;    Prior to Admission medications   Medication Sig Start Date End Date Taking? Authorizing Provider  acetaminophen (TYLENOL) 500 MG tablet Take 500-1,000 mg by mouth every 6 (six) hours as needed (for pain.).    [provider]  Cholecalciferol (VITAMIN D3) 125 MCG (5000 UT) CAPS Take 1 capsule by mouth daily.    [provider]  ibuprofen (ADVIL) 600 MG tablet Take 1 tablet (600 mg total) by mouth every 6 (six) hours as needed. 07/17/20   Becky Augusta, NP  methocarbamol (ROBAXIN) 500 MG tablet Take 1 tablet (500 mg total) by mouth 2 (two) times daily. 07/17/20   Becky Augusta, NP  Multiple Vitamin (MULTIVITAMIN WITH MINERALS) TABS tablet Take 1 tablet by mouth daily.    [provider]  zinc gluconate 50 MG tablet Take 50 mg by mouth daily.    [provider]    Allergies  Allergen Reactions  . Nsaids Other (See Comments)    Pt is unable to take due to gastric sleeve surgery.    Family History  Problem Relation Age of Onset  . Diabetes Mother   . Hypertension Mother   . Cancer Mother   . Cancer  Father     Social History Social History   Tobacco Use  . Smoking status: Never Smoker  . Smokeless tobacco: Never Used  Vaping Use  . Vaping Use: Never used  Substance Use Topics  . Alcohol use: Not Currently    Comment: states he quit drinkin  . Drug use: No    Review of Systems Constitutional: Negative for fever.  Generalized weakness/fatigue Cardiovascular: Intermittent mild chest discomfort, none currently Respiratory: Negative for shortness of breath. Gastrointestinal: Negative for abdominal pain, vomiting  Musculoskeletal: Negative for musculoskeletal complaints Neurological: Negative for headache All other ROS negative  ____________________________________________   PHYSICAL EXAM:  VITAL SIGNS: ED Triage Vitals  Enc Vitals Group     BP 10/01/20 1218 (!) 135/97     Pulse Rate 10/01/20 1218 68     Resp 10/01/20 1218 14     Temp 10/01/20 1218 98.3 F (36.8 C)     Temp Source 10/01/20 1218 Oral     SpO2 10/01/20 1218 99 %     Weight 10/01/20 1222 202 lb (91.6 kg)     Height 10/01/20 1222 5\' 9"  (1.753 m)     Head Circumference --      Peak Flow --      Pain Score 10/01/20 1221 5     Pain Loc --      Pain Edu? --      Excl. in GC? --    Constitutional: Alert and oriented. Well appearing and in no distress. Eyes: Normal exam ENT      Head: Normocephalic and atraumatic.      Mouth/Throat: Mucous membranes are moist. Cardiovascular: Normal rate, regular rhythm.  Respiratory: Normal respiratory effort without tachypnea nor retractions. Breath sounds are clear  Gastrointestinal: Soft and nontender. No distention.  Musculoskeletal: Nontender with normal range of motion in all extremities. No lower extremity tenderness or edema. Neurologic:  Normal speech and language. No gross focal neurologic deficits Skin:  Skin is warm, dry and intact.  Psychiatric: Mood and affect are normal.  ____________________________________________    EKG  EKG viewed and  interpreted by myself shows a sinus rhythm at 63 bpm with a narrow QRS, normal axis, normal intervals, no concerning ST changes  ____________________________________________   INITIAL IMPRESSION / ASSESSMENT AND PLAN / ED COURSE  Pertinent labs & imaging results that were available during my care of the patient were reviewed by me and considered in my medical decision making (see chart for details).   Patient presents to the emergency department for generalized fatigue/weakness found to be hypertensive at work today.  He also states he has been experiencing intermittent chest discomfort.  Overall the patient appears well, no distress.  Vital signs are reassuring with very slight hypertension otherwise normal.  We will check labs, COVID swab, troponin and an EKG.  We will continue to closely monitor and IV hydrate while awaiting results.  Patient agreeable to plan of care.  Patient's work-up  has shown hyponatremia sodium is down to 125 likely the cause for the patient's recent weakness and fatigue.  Is unclear why his sodium will be low he is also mildly anemic.  Patient states he does not take any medications or any supplements.  States he eats a lot of salt in his diet.  We will continue with IV hydration with normal saline and admit to the hospitalist service.  Patient agreeable plan of care  Trevor Torres was evaluated in Emergency Department on 10/01/2020 for the symptoms described in the history of present illness. He was evaluated in the context of the global COVID-19 pandemic, which necessitated consideration that the patient might be at risk for infection with the SARS-CoV-2 virus that causes COVID-19. Institutional protocols and algorithms that pertain to the evaluation of patients at risk for COVID-19 are in a state of rapid change based on information released by regulatory bodies including the CDC and federal and state organizations. These policies and algorithms were followed during the  patient's care in the ED.  ____________________________________________   FINAL CLINICAL IMPRESSION(S) / ED DIAGNOSES  Weakness Hyponatremia   Minna Antis, MD 10/01/20 1443

## 2020-10-02 ENCOUNTER — Observation Stay (HOSPITAL_COMMUNITY)
Admit: 2020-10-02 | Discharge: 2020-10-02 | Disposition: A | Discharge status: Home / Self Care | Payer: Managed Care, Other (non HMO) | Attending: Internal Medicine | Admitting: Internal Medicine

## 2020-10-02 DIAGNOSIS — R112 Nausea with vomiting, unspecified: Secondary | ICD-10-CM | POA: Diagnosis not present

## 2020-10-02 DIAGNOSIS — R1310 Dysphagia, unspecified: Secondary | ICD-10-CM | POA: Diagnosis not present

## 2020-10-02 DIAGNOSIS — R0609 Other forms of dyspnea: Secondary | ICD-10-CM

## 2020-10-02 DIAGNOSIS — D513 Other dietary vitamin B12 deficiency anemia: Secondary | ICD-10-CM

## 2020-10-02 DIAGNOSIS — D75839 Thrombocytosis, unspecified: Secondary | ICD-10-CM | POA: Diagnosis not present

## 2020-10-02 DIAGNOSIS — E871 Hypo-osmolality and hyponatremia: Secondary | ICD-10-CM | POA: Diagnosis not present

## 2020-10-02 DIAGNOSIS — D509 Iron deficiency anemia, unspecified: Secondary | ICD-10-CM | POA: Diagnosis not present

## 2020-10-02 LAB — COMPREHENSIVE METABOLIC PANEL
ALT: 31 U/L (ref 0–44)
AST: 34 U/L (ref 15–41)
Albumin: 3.8 g/dL (ref 3.5–5.0)
Alkaline Phosphatase: 34 U/L — ABNORMAL LOW (ref 38–126)
Anion gap: 7 (ref 5–15)
BUN: 14 mg/dL (ref 6–20)
CO2: 24 mmol/L (ref 22–32)
Calcium: 8.8 mg/dL — ABNORMAL LOW (ref 8.9–10.3)
Chloride: 99 mmol/L (ref 98–111)
Creatinine, Ser: 0.87 mg/dL (ref 0.61–1.24)
GFR, Estimated: >60 mL/min (ref >60)
Glucose, Bld: 84 mg/dL (ref 70–99)
Potassium: 4.2 mmol/L (ref 3.5–5.1)
Sodium: 130 mmol/L — ABNORMAL LOW (ref 135–145)
Total Bilirubin: 1 mg/dL (ref 0.3–1.2)
Total Protein: 6.7 g/dL (ref 6.5–8.1)

## 2020-10-02 LAB — CBC
HCT: 35.3 % — ABNORMAL LOW (ref 39.0–52.0)
Hemoglobin: 11.1 g/dL — ABNORMAL LOW (ref 13.0–17.0)
MCH: 22.2 pg — ABNORMAL LOW (ref 26.0–34.0)
MCHC: 31.4 g/dL (ref 30.0–36.0)
MCV: 70.7 fL — ABNORMAL LOW (ref 80.0–100.0)
Platelets: 428 10*3/uL — ABNORMAL HIGH (ref 150–400)
RBC: 4.99 MIL/uL (ref 4.22–5.81)
RDW: 17 % — ABNORMAL HIGH (ref 11.5–15.5)
WBC: 6.2 10*3/uL (ref 4.0–10.5)
nRBC: 0 % (ref 0.0–0.2)

## 2020-10-02 LAB — ECHOCARDIOGRAM COMPLETE
AR max vel: 3.48 cm2
AV Area VTI: 3.43 cm2
AV Area mean vel: 3.67 cm2
AV Mean grad: 2 mmHg
AV Peak grad: 3.8 mmHg
Ao pk vel: 0.98 m/s
Area-P 1/2: 2.49 cm2
Height: 69 in
S' Lateral: 2.65 cm
Weight: 3358.05 oz

## 2020-10-02 LAB — VITAMIN B12: Vitamin B-12: 60 pg/mL — ABNORMAL LOW (ref 180–914)

## 2020-10-02 LAB — PROTIME-INR
INR: 1.2 (ref 0.8–1.2)
Prothrombin Time: 14.9 seconds (ref 11.4–15.2)

## 2020-10-02 LAB — HIV ANTIBODY (ROUTINE TESTING W REFLEX): HIV Screen 4th Generation wRfx: NONREACTIVE

## 2020-10-02 MED ORDER — VITAMIN B-12 1000 MCG PO TABS
1000.0000 ug | ORAL_TABLET | Freq: Every day | ORAL | Status: DC
  Administered 2020-10-04: 1000 ug via ORAL
  Filled 2020-10-02: qty 1

## 2020-10-02 MED ORDER — FERROUS GLUCONATE 324 (38 FE) MG PO TABS
324.0000 mg | ORAL_TABLET | Freq: Two times a day (BID) | ORAL | Status: DC
  Administered 2020-10-02 (×2): 324 mg via ORAL
  Filled 2020-10-02 (×6): qty 1

## 2020-10-02 MED ORDER — CYANOCOBALAMIN 1000 MCG/ML IJ SOLN
1000.0000 ug | Freq: Once | INTRAMUSCULAR | Status: AC
  Administered 2020-10-02: 1000 ug via INTRAMUSCULAR
  Filled 2020-10-02: qty 1

## 2020-10-02 MED ORDER — MELATONIN 5 MG PO TABS
2.5000 mg | ORAL_TABLET | Freq: Every day | ORAL | Status: DC
  Administered 2020-10-02 – 2020-10-03 (×2): 2.5 mg via ORAL
  Filled 2020-10-02 (×2): qty 1

## 2020-10-02 MED ORDER — VITAMIN B-12 1000 MCG PO TABS: 1000.0000 ug | ORAL_TABLET | Freq: Every day | ORAL | Status: DC

## 2020-10-02 MED ORDER — SODIUM CHLORIDE 0.9 % IV SOLN: INTRAVENOUS | Status: DC

## 2020-10-02 MED ORDER — PEG 3350-KCL-NA BICARB-NACL 420 G PO SOLR
4000.0000 mL | Freq: Once | ORAL | Status: AC
  Administered 2020-10-02: 4000 mL via ORAL
  Filled 2020-10-02: qty 4000

## 2020-10-02 NOTE — Progress Notes (Signed)
*  PRELIMINARY RESULTS* Echocardiogram 2D Echocardiogram has been performed.  Trevor Torres 10/02/2020, 10:24 AM

## 2020-10-02 NOTE — Evaluation (Signed)
Physical Therapy Evaluation Patient Details Name: Trevor Torres MRN: [id number] DOB: 10/21/1970 Today's Date: 10/02/2020   History of Present Illness  Viet Kemmerer is a 49yoM who comes to [facility] on 5/17 with weakness, global fatigue, and feeling faint. Pt reports 2 days of symptoms eventually seen by RN at work noted to have elevated pressures 180s Systolic. PMH: GAD, gastric reflux, cholecystectomy, hernia repair, remote gastric sleeve procedure. 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 Pt reports he still maintains ~2400 carloie diet. Baseline also includes 38453 steps each day at work, in addition to cardio exercise.  Clinical Impression  Pt admitted with above diagnosis. Pt currently with functional limitations due to the deficits listed below (see "PT Problem List"). Upon entry, pt in bed, awake and agreeable to participate. The pt is alert, pleasant, interactive, and able to provide info regarding prior level of function, both in tolerance and independence. All mobility performed with modified independence or better. Pt has mild increased dizziness in standing, that is unconcerning to safety to patient/author. Pt has episodic dyspnea during AMB, but generally continues to feel overcome with global fatigue. Orthostatic vitals negative. O2 sats >95% throughout session. No LOB in session, Pt safely limits activity when symptoms present. Pt remains far from baseline activity tolerance, but is safe and independent with all basic functional activity. Pt will benefit from skilled PT intervention to increase independence and safety with basic mobility in preparation for discharge to the venue listed below.       Follow Up Recommendations No PT follow up    Equipment Recommendations  None recommended by  PT    Recommendations for Other Services       Precautions / Restrictions Precautions Precautions: None Restrictions Weight Bearing Restrictions: No      Mobility  Bed Mobility Overal bed mobility: Independent                  Transfers Overall transfer level: Independent                  Ambulation/Gait Ambulation/Gait assistance: Independent Gait Distance (Feet): 350 Feet Assistive device: None Gait Pattern/deviations: WFL(Within Functional Limits) Gait velocity: 0.37m/s   General Gait Details: stops after 161ft due to concerning dyspnea, then continues after 60sec.  Stairs            Wheelchair Mobility    Modified Rankin (Stroke Patients Only)       Balance Overall balance assessment: Independent;No apparent balance deficits (not formally assessed)                                           Pertinent Vitals/Pain Pain Assessment: Faces Faces Pain Scale: Hurts a little bit Pain Location: mild chespt pain at rest Pain Intervention(s): Monitored during session    Home Living Family/patient expects to be discharged to:: Private residence Living Arrangements: Alone   Type of Home: Apartment Home Access: Stairs to enter   [employer] of Steps: 1 Home Layout: One level Home Equipment: None      Prior Function Level of Independence: Independent               Hand  Dominance        Extremity/Trunk Assessment   Upper Extremity Assessment Upper Extremity Assessment: Generalized weakness;Overall [facility] for tasks assessed    Lower Extremity Assessment Lower Extremity Assessment: Generalized weakness;Overall [facility] for tasks assessed    Cervical / Trunk Assessment Cervical / Trunk Assessment: Normal  Communication   Communication: No difficulties  Cognition Arousal/Alertness: Awake/alert Behavior During Therapy: WFL for tasks assessed/performed Overall Cognitive Status: Within Functional Limits for  tasks assessed                                        General Comments      Exercises     Assessment/Plan    PT Assessment Patient needs continued PT services  PT Problem List Decreased activity tolerance;Decreased mobility       PT Treatment Interventions Functional mobility training;Therapeutic activities;Therapeutic exercise;Patient/family education    PT Goals (Current goals can be found in the Care Plan section)  Acute Rehab PT Goals Patient Stated Goal: feel better, resume work adn workouts PT Goal Formulation: With patient Time For Goal Achievement: 10/16/20 Potential to Achieve Goals: Good    Frequency Min 2X/week   Barriers to discharge        Co-evaluation               AM-PAC PT "6 Clicks" Mobility  Outcome Measure Help needed turning from your back to your side while in a flat bed without using bedrails?: None Help needed moving from lying on your back to sitting on the side of a flat bed without using bedrails?: None Help needed moving to and from a bed to a chair (including a wheelchair)?: None Help needed standing up from a chair using your arms (e.g., wheelchair or bedside chair)?: None Help needed to walk in hospital room?: None Help needed climbing 3-5 steps with a railing? : None 6 Click Score: 24    End of Session   Activity Tolerance: Patient tolerated treatment well;Patient limited by fatigue;Treatment limited secondary to medical complications (Comment) (episodic dyspnea, dizziness) Patient left: in bed;with call bell/phone within reach Nurse Communication: Mobility status PT Visit Diagnosis: Difficulty in walking, not elsewhere classified (R26.2);Muscle weakness (generalized) (M62.81)    Time: 1610-9604 PT Time Calculation (min) (ACUTE ONLY): 25 min   Charges:   PT Evaluation $PT Eval Low Complexity: 1 Low          4:45 PM, 10/02/20 Rosamaria Lints, PT, DPT Physical Therapist - [facility]  [phone] (ASCOM)    Shady Padron C 10/02/2020, 4:42 PM

## 2020-10-02 NOTE — Progress Notes (Signed)
PROGRESS NOTE    Trevor Torres  ELF:[id number] DOB: Feb 18, 1971 DOA: 10/01/2020 PCP: System, Provider Not In    Assessment & Plan:   Active Problems:   Near syncope   Near syncope: etiology unclear. Echo is pending.   Chest discomfort: troponins neg. EKG shows no acute ischemic changes. Echo is pending   IDA: will start iron supplement  Thrombocytosis: etiology unclear. Will continue to monitor  Hyponatremia: trending up from day prior. 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 B12 is low so will start B12 supplement. Vomiting after eating solids. CT abd/pelvis shows no acute reason for vomiting after eating solids. GI consulted   B12 deficiency: started on B12 supplement. 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  Dispo: The patient is from: Home              Anticipated d/c is to: Home              Patient currently is not medically stable to d/c.   Difficult to place patient: unclear       Consultants:   GI    Procedures:    Antimicrobials:    Subjective: Pt c/o fatigue   Objective: Vitals:   10/01/20 2151 10/02/20 0115 10/02/20 0403 10/02/20 0754  BP: 132/86 (!) 139/92 (!) 141/93 130/87  Pulse: 60 (!) 50 (!) 49 (!) 54  Resp: 16 18 16 18   Temp: 98 F (36.7 C)  97.7 F (36.5 C) 97.6 F (36.4 C)  TempSrc: Oral  Oral Oral  SpO2: 98% 100% 99% 100%  Weight: 95.2 kg     Height:        Intake/Output Summary (Last 24 hours) at 10/02/2020 0851 Last data filed at 10/02/2020 0348 Gross per 24 hour  Intake 1508.75 ml  Output 1400 ml  Net 108.75 ml   Filed Weights   10/01/20 1222 10/01/20 2151  Weight: 91.6 kg 95.2 kg    Examination:  General exam: Appears calm and comfortable  Respiratory system:  Clear to auscultation. Respiratory effort normal. Cardiovascular system: S1 & S2 +. No rubs, gallops or clicks.  Gastrointestinal system: Abdomen is nondistended, soft and nontender. Hypoactive bowel sounds heard. Central nervous system: Alert and oriented. Moves all extremities  Psychiatry: Judgement and insight appear normal. Flat mood and affect     Data Reviewed: I have personally reviewed following labs and imaging studies  CBC: Recent Labs  Lab 10/01/20 1225 10/02/20 0440  WBC 5.6 6.2  HGB 10.6* 11.1*  HCT 33.9* 35.3*  MCV 71.1* 70.7*  PLT 462* 428*   Basic Metabolic Panel: Recent Labs  Lab 10/01/20 1225 10/02/20 0440  NA 125* 130*  K 4.4 4.2  CL 94* 99  CO2 24 24  GLUCOSE 89 84  BUN 19 14  CREATININE 0.99 0.87  CALCIUM 9.2 8.8*   GFR: Estimated Creatinine Clearance: 119.5 mL/min (by C-G formula based on SCr of 0.87 mg/dL). Liver Function Tests: Recent Labs  Lab 10/01/20 1225 10/02/20 0440  AST 42* 34  ALT 37 31  ALKPHOS 37* 34*  BILITOT 0.9 1.0  PROT 7.0 6.7  ALBUMIN 4.3 3.8   No results for input(s): LIPASE, AMYLASE in the last 168 hours. No results for input(s): AMMONIA  in the last 168 hours. Coagulation Profile: Recent Labs  Lab 10/02/20 0440  INR 1.2   Cardiac Enzymes: No results for input(s): CKTOTAL, CKMB, CKMBINDEX, TROPONINI in the last 168 hours. BNP (last 3 results) No results for input(s): PROBNP in the last 8760 hours. HbA1C: No results for input(s): HGBA1C in the last 72 hours. CBG: No results for input(s): GLUCAP in the last 168 hours. Lipid Profile: No results for input(s): CHOL, HDL, LDLCALC, TRIG, CHOLHDL, LDLDIRECT in the last 72 hours. Thyroid Function Tests: Recent Labs    10/01/20 2211  TSH 4.896*   Anemia Panel: Recent Labs    10/01/20 1225 10/01/20 2211  VITAMINB12  --  60*  FOLATE 29.0  --   FERRITIN 9*  --   TIBC 427  --   IRON 16*  --   RETICCTPCT 1.3  --    Sepsis Labs: No results for input(s):  PROCALCITON, LATICACIDVEN in the last 168 hours.  Recent Results (from the past 240 hour(s))  Resp Panel by RT-PCR (Flu A&B, Covid) Nasopharyngeal Swab     Status: None   Collection Time: 10/01/20  1:56 PM   Specimen: Nasopharyngeal Swab; Nasopharyngeal(NP) swabs in vial transport medium  Result Value Ref Range Status   SARS Coronavirus 2 by RT PCR NEGATIVE NEGATIVE Final    Comment: (NOTE) SARS-CoV-2 target nucleic acids are NOT DETECTED.  The SARS-CoV-2 RNA is generally detectable in upper respiratory specimens during the acute phase of infection. The lowest concentration of SARS-CoV-2 viral copies this assay can detect is 138 copies/mL. A negative result does not preclude SARS-Cov-2 infection and should not be used as the sole basis for treatment or other patient management decisions. A negative result may occur with  improper specimen collection/handling, submission of specimen other than nasopharyngeal swab, presence of viral mutation(s) within the areas targeted by this assay, and inadequate number of viral copies(<138 copies/mL). A negative result must be combined with clinical observations, patient history, and epidemiological information. The expected result is Negative.  Fact Sheet for Patients:  BloggerCourse.com  Fact Sheet for Healthcare Providers:  SeriousBroker.it  This test is no t yet approved or cleared by the [place] FDA and  has been authorized for detection and/or diagnosis of SARS-CoV-2 by FDA under an Emergency Use Authorization (EUA). This EUA will remain  in effect (meaning this test can be used) for the duration of the COVID-19 declaration under Section 564(b)(1) of the Act, 21 U.S.C.section 360bbb-3(b)(1), unless the authorization is terminated  or revoked sooner.       Influenza A by PCR NEGATIVE NEGATIVE Final   Influenza B by PCR NEGATIVE NEGATIVE Final    Comment: (NOTE) The Xpert Xpress  SARS-CoV-2/FLU/RSV plus assay is intended as an aid in the diagnosis of influenza from Nasopharyngeal swab specimens and should not be used as a sole basis for treatment. Nasal washings and aspirates are unacceptable for Xpert Xpress SARS-CoV-2/FLU/RSV testing.  Fact Sheet for Patients: BloggerCourse.com  Fact Sheet for Healthcare Providers: SeriousBroker.it  This test is not yet approved or cleared by the [place] FDA and has been authorized for detection and/or diagnosis of SARS-CoV-2 by FDA under an Emergency Use Authorization (EUA). This EUA will remain in effect (meaning this test can be used) for the duration of the COVID-19 declaration under Section 564(b)(1) of the Act, 21 U.S.C. section 360bbb-3(b)(1), unless the authorization is terminated or revoked.  Performed at [facility], [address], [place], [place] [address]  Radiology Studies: CT ABDOMEN PELVIS WO CONTRAST  Result Date: 10/01/2020 CLINICAL DATA:  Epigastric abdominal pain. Remote history of gastric sleeve procedure. EXAM: CT ABDOMEN AND PELVIS WITHOUT CONTRAST TECHNIQUE: Multidetector CT imaging of the abdomen and pelvis was performed following the standard protocol without IV contrast. COMPARISON:  01/11/2018 FINDINGS: Lower chest: The lung bases are clear of an acute process. No pleural effusions or pulmonary lesions. The heart is normal in size. No pericardial effusion. Hepatobiliary: No hepatic lesions are identified without contrast. No intrahepatic biliary dilatation. The gallbladder is surgically absent. No common bile duct dilatation. Pancreas: No obvious mass, acute inflammation ductal dilatation. Spleen: Normal size.  No focal lesions. Adrenals/Urinary Tract: The adrenal glands and kidneys are grossly normal. No renal calculi or obstructing ureteral calculi. The bladder is grossly normal. Stomach/Bowel: The surgical changes are  noted along the GE junction from a probable hiatal hernia repair. I do not see any definite findings for recurrent hernia. There are also surgical changes from a gastric sleeve procedure. No complicating features are identified. The duodenum and small bowel are unremarkable. No evidence of colonic mass or colonic obstruction. Vascular/Lymphatic: Minimal scattered aortic calcifications. No aneurysm. No mesenteric or retroperitoneal mass or adenopathy. Reproductive: The prostate gland and seminal vesicles are unremarkable. The Other: No pelvic mass or significant free pelvic fluid collections. No inguinal mass or hernia. Musculoskeletal: No significant bony findings. IMPRESSION: 1. Surgical changes from a probable hiatal hernia repair and gastric sleeve procedure. No complicating features are identified. 2. No acute abdominal/pelvic findings, mass lesions or adenopathy. 3. Status post cholecystectomy. No biliary dilatation. Electronically Signed   By: Rudie Meyer M.D.   On: 10/01/2020 17:03        Scheduled Meds: . cholecalciferol  5,000 Units Oral Daily  . melatonin  2.5 mg Oral QHS  . pantoprazole (PROTONIX) IV  40 mg Intravenous Q12H   Continuous Infusions: . sodium chloride 100 mL/hr at 10/02/20 0348     LOS: 0 days    Time spent: 33 mins     Charise Killian, MD [facility] Pager [phone]  If 7PM-7AM, please contact night-coverage 10/02/2020, 8:51 AM

## 2020-10-02 NOTE — Consult Note (Signed)
[name], MD [address], [place], [place], [address] [address], [place], [place], [address] Phone: [phone]  Fax: [phone]  Consultation  Referring Provider:     Dr. Mayford Knife Primary Care Physician:  System, Provider Not In Reason for Consultation:    Nausea vomiting  Date of Admission:  10/01/2020 Date of Consultation:  10/02/2020         HPI:   Trevor Torres is a 50 y.o. male reports chronic history of nausea vomiting and states this may have recently worsened and also reports dysphagia over the last 2 to 3 months.  Patient has history of gastric sleeve in 2014, was evaluated by GI, Dr. Bluford Kaufmann for abdominal pain in 2015 and underwent EGD and colonoscopy.  See probation.  Gastric erythema was seen and biopsied.  Colon was normal.  Due to ongoing abdominal pain he was referred to surgery for abdominal pain at that time to evaluate for possible cholecystectomy although HIDA scan was negative at the time.  He underwent cholecystectomy in 2019.  Patient reports epigastric pain, dull, 5 or 10, nonradiating, with no aggravating or relieving factors.  However, he does state that he vomits multiple times a day, especially after meals.  No hematemesis.  No altered bowel habits or blood in stool.  Past Medical History:  Diagnosis Date  . Anxiety   . Cellulitis 01-2015   left arm  . GERD (gastroesophageal reflux disease)    H/O PRIOR TO WEIGHT LOSS  . Headache   . HTN (hypertension) 01/07/2015   H/O/ GASTRIC SLEEVE /WT LOSS  . Hypothyroidism    not since weight loss  . MRSA infection 2014   h/o years ago  . PONV (postoperative nausea and vomiting) 01/29/2020  . Renal insufficiency    error in entry  . Sleep apnea    H/O PRIOR TO WEIGHT LOSS    Past Surgical History:  Procedure Laterality Date  . CHOLECYSTECTOMY N/A 01/14/2018   Procedure: LAPAROSCOPIC CHOLECYSTECTOMY;  Surgeon: Ancil Linsey, MD;  Location: [facility] ORS;  Service: General;   Laterality: N/A;  . COLONOSCOPY  2015  . ESOPHAGOGASTRODUODENOSCOPY  2015  . LAPAROSCOPIC GASTRIC SLEEVE RESECTION  2013  . SHOULDER ARTHROSCOPY WITH OPEN ROTATOR CUFF REPAIR Right 02/06/2015   Procedure: SHOULDER ARTHROSCOPY WITH OPEN ROTATOR CUFF REPAIR;  Surgeon: Juanell Fairly, MD;  Location: [facility] ORS;  Service: Orthopedics;  Laterality: Right;  . XI ROBOTIC ASSISTED VENTRAL HERNIA N/A 01/29/2020   Procedure: XI ROBOTIC ASSISTED INCISIONAL HERNIA REPAIR WITH MESH;  Surgeon: Carolan Shiver, MD;  Location: [facility] ORS;  Service: General;  Laterality: N/A;    Prior to Admission medications   Medication Sig Start Date End Date Taking? Authorizing Provider  acetaminophen (TYLENOL) 500 MG tablet Take 500-1,000 mg by mouth every 6 (six) hours as needed (for pain.).   Yes [provider]  Cholecalciferol (VITAMIN D3) 125 MCG (5000 UT) CAPS Take 1 capsule by mouth daily.   Yes [provider]  ibuprofen (ADVIL) 600 MG tablet Take 1 tablet (600 mg total) by mouth every 6 (six) hours as needed. 07/17/20  Yes Becky Augusta, NP  meloxicam (MOBIC) 15 MG tablet Take 1 tablet by mouth daily. 08/27/20  Yes [provider]  Multiple Vitamin (MULTIVITAMIN WITH MINERALS) TABS tablet Take 1 tablet by mouth daily.   Yes [provider]  zinc gluconate 50 MG tablet Take 50 mg by mouth daily.   Yes [provider]  methocarbamol (ROBAXIN)  500 MG tablet Take 1 tablet (500 mg total) by mouth 2 (two) times daily. Patient not taking: No sig reported 07/17/20   Becky Augusta, NP    Family History  Problem Relation Age of Onset  . Diabetes Mother   . Hypertension Mother   . Cancer Mother   . Cancer Father      Social History   Tobacco Use  . Smoking status: Never Smoker  . Smokeless tobacco: Never Used  Vaping Use  . Vaping Use: Never used  Substance Use Topics  . Alcohol use: Not Currently    Comment: states he quit drinkin  . Drug use: No    Allergies as of  10/01/2020 - Review Complete 10/01/2020  Allergen Reaction Noted  . Nsaids Other (See Comments) 09/06/2014    Review of Systems:    All systems reviewed and negative except where noted in HPI.   Physical Exam:  Vital signs in last 24 hours: Vitals:   10/02/20 0115 10/02/20 0403 10/02/20 0754 10/02/20 1111  BP: (!) 139/92 (!) 141/93 130/87 (!) 129/95  Pulse: (!) 50 (!) 49 (!) 54 (!) 53  Resp: 18 16 18 12   Temp:  97.7 F (36.5 C) 97.6 F (36.4 C)   TempSrc:  Oral Oral   SpO2: 100% 99% 100% 100%  Weight:      Height:         General:   Pleasant, cooperative in NAD Head:  Normocephalic and atraumatic. Eyes:   No icterus.   Conjunctiva pink. PERRLA. Ears:  Normal auditory acuity. Neck:  Supple; no masses or thyroidomegaly Lungs: Respirations even and unlabored. Lungs clear to auscultation bilaterally.   No wheezes, crackles, or rhonchi.  Abdomen:  Soft, nondistended, nontender. Normal bowel sounds. No appreciable masses or hepatomegaly.  No rebound or guarding.  Neurologic:  Alert and oriented x3;  grossly normal neurologically. Skin:  Intact without significant lesions or rashes. Cervical Nodes:  No significant cervical adenopathy. Psych:  Alert and cooperative. Normal affect.  LAB RESULTS: Recent Labs    10/01/20 1225 10/02/20 0440  WBC 5.6 6.2  HGB 10.6* 11.1*  HCT 33.9* 35.3*  PLT 462* 428*   BMET Recent Labs    10/01/20 1225 10/02/20 0440  NA 125* 130*  K 4.4 4.2  CL 94* 99  CO2 24 24  GLUCOSE 89 84  BUN 19 14  CREATININE 0.99 0.87  CALCIUM 9.2 8.8*   LFT Recent Labs    10/02/20 0440  PROT 6.7  ALBUMIN 3.8  AST 34  ALT 31  ALKPHOS 34*  BILITOT 1.0   PT/INR Recent Labs    10/02/20 0440  LABPROT 14.9  INR 1.2    STUDIES: CT ABDOMEN PELVIS WO CONTRAST  Result Date: 10/01/2020 CLINICAL DATA:  Epigastric abdominal pain. Remote history of gastric sleeve procedure. EXAM: CT ABDOMEN AND PELVIS WITHOUT CONTRAST TECHNIQUE: Multidetector CT  imaging of the abdomen and pelvis was performed following the standard protocol without IV contrast. COMPARISON:  01/11/2018 FINDINGS: Lower chest: The lung bases are clear of an acute process. No pleural effusions or pulmonary lesions. The heart is normal in size. No pericardial effusion. Hepatobiliary: No hepatic lesions are identified without contrast. No intrahepatic biliary dilatation. The gallbladder is surgically absent. No common bile duct dilatation. Pancreas: No obvious mass, acute inflammation ductal dilatation. Spleen: Normal size.  No focal lesions. Adrenals/Urinary Tract: The adrenal glands and kidneys are grossly normal. No renal calculi or obstructing ureteral calculi. The bladder is grossly normal.  Stomach/Bowel: The surgical changes are noted along the GE junction from a probable hiatal hernia repair. I do not see any definite findings for recurrent hernia. There are also surgical changes from a gastric sleeve procedure. No complicating features are identified. The duodenum and small bowel are unremarkable. No evidence of colonic mass or colonic obstruction. Vascular/Lymphatic: Minimal scattered aortic calcifications. No aneurysm. No mesenteric or retroperitoneal mass or adenopathy. Reproductive: The prostate gland and seminal vesicles are unremarkable. The Other: No pelvic mass or significant free pelvic fluid collections. No inguinal mass or hernia. Musculoskeletal: No significant bony findings. IMPRESSION: 1. Surgical changes from a probable hiatal hernia repair and gastric sleeve procedure. No complicating features are identified. 2. No acute abdominal/pelvic findings, mass lesions or adenopathy. 3. Status post cholecystectomy. No biliary dilatation. Electronically Signed   By: Rudie Meyer M.D.   On: 10/01/2020 17:03      Impression / Plan:   LAMAR METER is a 50 y.o. y/o male with chronic abdominal pain with nausea vomiting, also reporting dysphagia, history of gastric sleeve in  2014  Patient's last upper endoscopy was in 2015 for abdominal pain that showed gastric erythema.  Pathology report from that procedure not available.  However, he did not have dysphagia at that time which is new  He also has new iron deficiency anemia  Given ongoing symptoms requiring inpatient admission, would recommend proceeding with upper endoscopy and colonoscopy at this time  I have discussed alternative options, risks & benefits,  which include, but are not limited to, bleeding, infection, perforation,respiratory complication & drug reaction.  The patient agrees with this plan & written consent will be obtained.    Alternative options of conservative management were discussed in detail, including but not limited to medication management, foregoing endoscopic procedures at this time and others.    N.p.o. past midnight for upper endoscopy and colonoscopy scheduled tomorrow  Thank you for involving me in the care of this patient.      LOS: 0 days   Trevor Spillers, MD  10/02/2020, 3:53 PM

## 2020-10-02 NOTE — Plan of Care (Signed)

## 2020-10-03 ENCOUNTER — Observation Stay: Payer: Managed Care, Other (non HMO) | Admitting: Anesthesiology

## 2020-10-03 ENCOUNTER — Encounter
Admission: EM | Disposition: A | Discharge status: Home / Self Care | Payer: Self-pay | Source: Home / Self Care | Attending: Internal Medicine

## 2020-10-03 ENCOUNTER — Encounter: Payer: Self-pay | Admitting: Internal Medicine

## 2020-10-03 DIAGNOSIS — R1110 Vomiting, unspecified: Secondary | ICD-10-CM

## 2020-10-03 DIAGNOSIS — K319 Disease of stomach and duodenum, unspecified: Secondary | ICD-10-CM

## 2020-10-03 DIAGNOSIS — D518 Other vitamin B12 deficiency anemias: Secondary | ICD-10-CM | POA: Diagnosis not present

## 2020-10-03 DIAGNOSIS — R112 Nausea with vomiting, unspecified: Secondary | ICD-10-CM | POA: Diagnosis not present

## 2020-10-03 DIAGNOSIS — R1319 Other dysphagia: Secondary | ICD-10-CM | POA: Diagnosis not present

## 2020-10-03 DIAGNOSIS — D509 Iron deficiency anemia, unspecified: Secondary | ICD-10-CM | POA: Diagnosis not present

## 2020-10-03 DIAGNOSIS — R109 Unspecified abdominal pain: Secondary | ICD-10-CM

## 2020-10-03 DIAGNOSIS — R1310 Dysphagia, unspecified: Secondary | ICD-10-CM | POA: Diagnosis not present

## 2020-10-03 HISTORY — PX: COLONOSCOPY: SHX5424

## 2020-10-03 HISTORY — PX: ESOPHAGOGASTRODUODENOSCOPY: SHX5428

## 2020-10-03 LAB — CBC
HCT: 33.6 % — ABNORMAL LOW (ref 39.0–52.0)
Hemoglobin: 10.3 g/dL — ABNORMAL LOW (ref 13.0–17.0)
MCH: 22.1 pg — ABNORMAL LOW (ref 26.0–34.0)
MCHC: 30.7 g/dL (ref 30.0–36.0)
MCV: 72.1 fL — ABNORMAL LOW (ref 80.0–100.0)
Platelets: 418 10*3/uL — ABNORMAL HIGH (ref 150–400)
RBC: 4.66 MIL/uL (ref 4.22–5.81)
RDW: 16.9 % — ABNORMAL HIGH (ref 11.5–15.5)
WBC: 5 10*3/uL (ref 4.0–10.5)
nRBC: 0 % (ref 0.0–0.2)

## 2020-10-03 LAB — BASIC METABOLIC PANEL
Anion gap: 6 (ref 5–15)
BUN: 8 mg/dL (ref 6–20)
CO2: 24 mmol/L (ref 22–32)
Calcium: 8.4 mg/dL — ABNORMAL LOW (ref 8.9–10.3)
Chloride: 101 mmol/L (ref 98–111)
Creatinine, Ser: 0.83 mg/dL (ref 0.61–1.24)
GFR, Estimated: >60 mL/min (ref >60)
Glucose, Bld: 79 mg/dL (ref 70–99)
Potassium: 3.9 mmol/L (ref 3.5–5.1)
Sodium: 131 mmol/L — ABNORMAL LOW (ref 135–145)

## 2020-10-03 SURGERY — EGD (ESOPHAGOGASTRODUODENOSCOPY)
Anesthesia: General

## 2020-10-03 MED ORDER — LIDOCAINE HCL (CARDIAC) PF 100 MG/5ML IV SOSY
PREFILLED_SYRINGE | INTRAVENOUS | Status: DC | PRN
  Administered 2020-10-03: 50 mg via INTRAVENOUS

## 2020-10-03 MED ORDER — DIPHENHYDRAMINE HCL 25 MG PO CAPS: 25.0000 mg | ORAL_CAPSULE | Freq: Every evening | ORAL | Status: DC | PRN

## 2020-10-03 MED ORDER — DEXMEDETOMIDINE (PRECEDEX) IN NS 20 MCG/5ML (4 MCG/ML) IV SYRINGE
PREFILLED_SYRINGE | INTRAVENOUS | Status: AC
  Filled 2020-10-03: qty 5

## 2020-10-03 MED ORDER — PROPOFOL 500 MG/50ML IV EMUL
INTRAVENOUS | Status: DC | PRN
  Administered 2020-10-03: 150 ug/kg/min via INTRAVENOUS

## 2020-10-03 MED ORDER — SCOPOLAMINE 1 MG/3DAYS TD PT72
1.0000 | MEDICATED_PATCH | TRANSDERMAL | Status: DC
  Administered 2020-10-03: 1.5 mg via TRANSDERMAL
  Filled 2020-10-03: qty 1

## 2020-10-03 MED ORDER — PROPOFOL 10 MG/ML IV BOLUS
INTRAVENOUS | Status: DC | PRN
  Administered 2020-10-03: 100 mg via INTRAVENOUS

## 2020-10-03 MED ORDER — SODIUM CHLORIDE 0.9 % IV SOLN
6.2500 mg | Freq: Four times a day (QID) | INTRAVENOUS | Status: DC | PRN
  Administered 2020-10-03: 6.25 mg via INTRAVENOUS
  Filled 2020-10-03 (×2): qty 0.25

## 2020-10-03 MED ORDER — GLYCOPYRROLATE 0.2 MG/ML IJ SOLN
INTRAMUSCULAR | Status: DC | PRN
  Administered 2020-10-03: .2 mg via INTRAVENOUS

## 2020-10-03 MED ORDER — DEXMEDETOMIDINE (PRECEDEX) IN NS 20 MCG/5ML (4 MCG/ML) IV SYRINGE
PREFILLED_SYRINGE | INTRAVENOUS | Status: DC | PRN
  Administered 2020-10-03: 8 ug via INTRAVENOUS
  Administered 2020-10-03: 12 ug via INTRAVENOUS

## 2020-10-03 NOTE — Progress Notes (Signed)
Order received from Dr Williams to discontinue telemetry 

## 2020-10-03 NOTE — Transfer of Care (Signed)
Immediate Anesthesia Transfer of Care Note  Patient: ALDRIDGE KRZYZANOWSKI  Procedure(s) Performed: ESOPHAGOGASTRODUODENOSCOPY (EGD) (N/A ) COLONOSCOPY (N/A )  Patient Location: PACU and Endoscopy Unit  Anesthesia Type:General  Level of Consciousness: drowsy  Airway & Oxygen Therapy: Patient Spontanous Breathing  Post-op Assessment: Report given to RN  Post vital signs: stable  Last Vitals:  Vitals Value Taken Time  BP 119/80 10/03/20 1048  Temp    Pulse 87 10/03/20 1048  Resp 11 10/03/20 1048  SpO2 95 % 10/03/20 1048  Vitals shown include unvalidated device data.  Last Pain:  Vitals:   10/03/20 0927  TempSrc: Temporal  PainSc: 0-No pain         Complications: No complications documented.

## 2020-10-03 NOTE — Progress Notes (Signed)
Patient continues to have nausea after receiving zofran. He does say he gets nauseated because he ate too much at once. Order received from Dr Mayford Knife for scopolamine patch and phenergan

## 2020-10-03 NOTE — Progress Notes (Signed)
PROGRESS NOTE    Trevor Torres  PJA:[id number] DOB: [birth year] DOA: 10/01/2020 PCP: System, Provider Not In    Assessment & Plan:   Active Problems:   Near syncope  Vomiting after eating solids: etiology unclear. Hx of gastric sleeve. CT abd/pelvis shows no acute reason for vomiting after eating solids. S/p EGD which showed mucosal changes in the duodenum, biopsied & biopsies taken at the gastric body, incisura & gastric antrum. S/p colonoscopy showed non-bleeding internal hemorrhoids.   Near syncope: etiology unclear. Echo shows EF of 50-55%, normal diastolic function & no regional wall motion abnormalities   Chest discomfort: troponins neg. EKG shows no acute ischemic changes. Echo shows EF 50-55%, normal diastolic function & no regional wall motion abnormalities. Resolved    IDA: will continue on iron supplement   Thrombocytosis: etiology unclear. Will continue to monitor   Hyponatremia: continues to trend up. 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 B12 is low so will continue on B12 supplement   B12 deficiency: continue on B12 supplement. 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  Dispo: The patient is from: Home              Anticipated d/c is to: Home              Patient currently is not medically stable to d/c.   Difficult to place patient: unclear       Consultants:   GI    Procedures:    Antimicrobials:    Subjective: Pt c/o malaise   Objective: Vitals:   10/02/20 2100 10/02/20 2313 10/03/20 0351 10/03/20 0723  BP:  134/89 132/86 (!) 122/96  Pulse:  60 62 64  Resp:  16 20 16   Temp: (!) 97.5 F (36.4 C) 98.2 F (36.8 C) (!) 97.5 F (36.4 C) 97.9 F (36.6 C)  TempSrc: Oral Oral Oral   SpO2:   100% 99% 100%  Weight:      Height:        Intake/Output Summary (Last 24 hours) at 10/03/2020 10/05/2020 Last data filed at 10/03/2020 0209 Gross per 24 hour  Intake 2043.88 ml  Output --  Net 2043.88 ml   Filed Weights   10/01/20 1222 10/01/20 2151  Weight: 91.6 kg 95.2 kg    Examination:  General exam: Appears lethargic (seen after procedures today) Respiratory system: clear breath sounds b/l  Cardiovascular system: S1/S2+. No rubs or clicks   Gastrointestinal system: Abd is soft, NT, ND & hypoactive bowel sounds  Central nervous system: Lethargic. Moves all extremities  Psychiatry: judgement and insight appear normal. Flat mood and affect    Data Reviewed: I have personally reviewed following labs and imaging studies  CBC: Recent Labs  Lab 10/01/20 1225 10/02/20 0440 10/03/20 0400  WBC 5.6 6.2 5.0  HGB 10.6* 11.1* 10.3*  HCT 33.9* 35.3* 33.6*  MCV 71.1* 70.7* 72.1*  PLT 462* 428* 418*   Basic Metabolic Panel: Recent Labs  Lab 10/01/20 1225 10/02/20 0440 10/03/20 0400  NA 125* 130* 131*  K 4.4 4.2 3.9  CL 94* 99 101  CO2 24 24 24   GLUCOSE 89 84 79  BUN 19 14 8   CREATININE 0.99 0.87 0.83  CALCIUM 9.2 8.8* 8.4*  GFR: Estimated Creatinine Clearance: 125.3 mL/min (by C-G formula based on SCr of 0.83 mg/dL). Liver Function Tests: Recent Labs  Lab 10/01/20 1225 10/02/20 0440  AST 42* 34  ALT 37 31  ALKPHOS 37* 34*  BILITOT 0.9 1.0  PROT 7.0 6.7  ALBUMIN 4.3 3.8   No results for input(s): LIPASE, AMYLASE in the last 168 hours. No results for input(s): AMMONIA in the last 168 hours. Coagulation Profile: Recent Labs  Lab 10/02/20 0440  INR 1.2   Cardiac Enzymes: No results for input(s): CKTOTAL, CKMB, CKMBINDEX, TROPONINI in the last 168 hours. BNP (last 3 results) No results for input(s): PROBNP in the last 8760 hours. HbA1C: No results for input(s): HGBA1C in the last 72 hours. CBG: No results for input(s): GLUCAP in the last 168  hours. Lipid Profile: No results for input(s): CHOL, HDL, LDLCALC, TRIG, CHOLHDL, LDLDIRECT in the last 72 hours. Thyroid Function Tests: Recent Labs    10/01/20 2211  TSH 4.896*   Anemia Panel: Recent Labs    10/01/20 1225 10/01/20 2211  VITAMINB12  --  60*  FOLATE 29.0  --   FERRITIN 9*  --   TIBC 427  --   IRON 16*  --   RETICCTPCT 1.3  --    Sepsis Labs: No results for input(s): PROCALCITON, LATICACIDVEN in the last 168 hours.  Recent Results (from the past 240 hour(s))  Resp Panel by RT-PCR (Flu A&B, Covid) Nasopharyngeal Swab     Status: None   Collection Time: 10/01/20  1:56 PM   Specimen: Nasopharyngeal Swab; Nasopharyngeal(NP) swabs in vial transport medium  Result Value Ref Range Status   SARS Coronavirus 2 by RT PCR NEGATIVE NEGATIVE Final    Comment: (NOTE) SARS-CoV-2 target nucleic acids are NOT DETECTED.  The SARS-CoV-2 RNA is generally detectable in upper respiratory specimens during the acute phase of infection. The lowest concentration of SARS-CoV-2 viral copies this assay can detect is 138 copies/mL. A negative result does not preclude SARS-Cov-2 infection and should not be used as the sole basis for treatment or other patient management decisions. A negative result may occur with  improper specimen collection/handling, submission of specimen other than nasopharyngeal swab, presence of viral mutation(s) within the areas targeted by this assay, and inadequate number of viral copies(<138 copies/mL). A negative result must be combined with clinical observations, patient history, and epidemiological information. The expected result is Negative.  Fact Sheet for Patients:  BloggerCourse.com  Fact Sheet for Healthcare Providers:  SeriousBroker.it  This test is no t yet approved or cleared by the [place] FDA and  has been authorized for detection and/or diagnosis of SARS-CoV-2 by FDA under an  Emergency Use Authorization (EUA). This EUA will remain  in effect (meaning this test can be used) for the duration of the COVID-19 declaration under Section 564(b)(1) of the Act, 21 U.S.C.section 360bbb-3(b)(1), unless the authorization is terminated  or revoked sooner.       Influenza A by PCR NEGATIVE NEGATIVE Final   Influenza B by PCR NEGATIVE NEGATIVE Final    Comment: (NOTE) The Xpert Xpress SARS-CoV-2/FLU/RSV plus assay is intended as an aid in the diagnosis of influenza from Nasopharyngeal swab specimens and should not be used as a sole basis for treatment. Nasal washings and aspirates are unacceptable for Xpert Xpress SARS-CoV-2/FLU/RSV testing.  Fact Sheet for Patients: BloggerCourse.com  Fact Sheet for Healthcare Providers: SeriousBroker.it  This test is not yet approved or cleared by the [place] FDA and has been  authorized for detection and/or diagnosis of SARS-CoV-2 by FDA under an Emergency Use Authorization (EUA). This EUA will remain in effect (meaning this test can be used) for the duration of the COVID-19 declaration under Section 564(b)(1) of the Act, 21 U.S.C. section 360bbb-3(b)(1), unless the authorization is terminated or revoked.  Performed at [facility], [address] [place], [place] [address]          Radiology Studies: CT ABDOMEN PELVIS WO CONTRAST  Result Date: 10/01/2020 CLINICAL DATA:  Epigastric abdominal pain. Remote history of gastric sleeve procedure. EXAM: CT ABDOMEN AND PELVIS WITHOUT CONTRAST TECHNIQUE: Multidetector CT imaging of the abdomen and pelvis was performed following the standard protocol without IV contrast. COMPARISON:  01/11/2018 FINDINGS: Lower chest: The lung bases are clear of an acute process. No pleural effusions or pulmonary lesions. The heart is normal in size. No pericardial effusion. Hepatobiliary: No hepatic lesions are identified without  contrast. No intrahepatic biliary dilatation. The gallbladder is surgically absent. No common bile duct dilatation. Pancreas: No obvious mass, acute inflammation ductal dilatation. Spleen: Normal size.  No focal lesions. Adrenals/Urinary Tract: The adrenal glands and kidneys are grossly normal. No renal calculi or obstructing ureteral calculi. The bladder is grossly normal. Stomach/Bowel: The surgical changes are noted along the GE junction from a probable hiatal hernia repair. I do not see any definite findings for recurrent hernia. There are also surgical changes from a gastric sleeve procedure. No complicating features are identified. The duodenum and small bowel are unremarkable. No evidence of colonic mass or colonic obstruction. Vascular/Lymphatic: Minimal scattered aortic calcifications. No aneurysm. No mesenteric or retroperitoneal mass or adenopathy. Reproductive: The prostate gland and seminal vesicles are unremarkable. The Other: No pelvic mass or significant free pelvic fluid collections. No inguinal mass or hernia. Musculoskeletal: No significant bony findings. IMPRESSION: 1. Surgical changes from a probable hiatal hernia repair and gastric sleeve procedure. No complicating features are identified. 2. No acute abdominal/pelvic findings, mass lesions or adenopathy. 3. Status post cholecystectomy. No biliary dilatation. Electronically Signed   By: Rudie Meyer M.D.   On: 10/01/2020 17:03   ECHOCARDIOGRAM COMPLETE  Result Date: 10/02/2020    ECHOCARDIOGRAM REPORT   Patient Name:   Trevor Torres Date of Exam: 10/02/2020 Medical Rec #:  [id number]      Height:       69.0 in Accession #:    [id number]     Weight:       209.9 lb Date of Birth:  1970-07-02     BSA:          2.109 m Patient Age:    49 years       BP:           130/87 mmHg Patient Gender: M              HR:           54 bpm. Exam Location:  [facility] Procedure: 2D Echo, Color Doppler, Cardiac Doppler and Strain Analysis Indications:     Dyspnea  R06.00  History:         Patient has no prior history of Echocardiogram examinations.                  Risk Factors:Sleep Apnea and Hypertension.  Sonographer:     Cristela Blue RDCS (AE) Referring Phys:  [id number] Diagnosing Phys: Debbe Odea MD  Sonographer Comments: Global longitudinal strain was attempted. IMPRESSIONS  1. Left ventricular ejection fraction, by estimation,  is 50 to 55%. The left ventricle has low normal function. The left ventricle has no regional wall motion abnormalities. Left ventricular diastolic parameters were normal.  2. Right ventricular systolic function is normal. The right ventricular size is normal.  3. The mitral valve is normal in structure. No evidence of mitral valve regurgitation.  4. The aortic valve is tricuspid. Aortic valve regurgitation is not visualized. FINDINGS  Left Ventricle: Left ventricular ejection fraction, by estimation, is 50 to 55%. The left ventricle has low normal function. The left ventricle has no regional wall motion abnormalities. Global longitudinal strain performed but not reported based on interpreter judgement due to suboptimal tracking. 3D left ventricular ejection fraction analysis performed but not reported based on interpreter judgement due to suboptimal quality. The left ventricular internal cavity size was normal in size. There is no left ventricular hypertrophy. Left ventricular diastolic parameters were normal. Right Ventricle: The right ventricular size is normal. No increase in right ventricular wall thickness. Right ventricular systolic function is normal. Left Atrium: Left atrial size was normal in size. Right Atrium: Right atrial size was normal in size. Pericardium: Trivial pericardial effusion is present. Mitral Valve: The mitral valve is normal in structure. No evidence of mitral valve regurgitation. Tricuspid Valve: The tricuspid valve is normal in structure. Tricuspid valve regurgitation is mild. Aortic Valve: The aortic  valve is tricuspid. Aortic valve regurgitation is not visualized. Aortic valve mean gradient measures 2.0 mmHg. Aortic valve peak gradient measures 3.8 mmHg. Aortic valve area, by VTI measures 3.43 cm. Pulmonic Valve: The pulmonic valve was not well visualized. Pulmonic valve regurgitation is not visualized. Aorta: The aortic root is normal in size and structure. Venous: The inferior vena cava was not well visualized. IAS/Shunts: No atrial level shunt detected by color flow Doppler.  LEFT VENTRICLE PLAX 2D LVIDd:         4.29 cm  Diastology LVIDs:         2.65 cm  LV e' medial:    5.77 cm/s LV PW:         1.11 cm  LV E/e' medial:  10.5 LV IVS:        0.83 cm  LV e' lateral:   13.20 cm/s LVOT diam:     2.30 cm  LV E/e' lateral: 4.6 LV SV:         72 LV SV Index:   34 LVOT Area:     4.15 cm  RIGHT VENTRICLE RV Basal diam:  4.17 cm RV S prime:     15.00 cm/s TAPSE (M-mode): 3.7 cm LEFT ATRIUM           Index       RIGHT ATRIUM           Index LA diam:      3.00 cm 1.42 cm/m  RA Area:     19.60 cm LA Vol (A2C): 37.7 ml 17.88 ml/m RA Volume:   54.40 ml  25.80 ml/m LA Vol (A4C): 21.7 ml 10.29 ml/m  AORTIC VALVE                   PULMONIC VALVE AV Area (Vmax):    3.48 cm    PV Vmax:        0.75 m/s AV Area (Vmean):   3.67 cm    PV Peak grad:   2.2 mmHg AV Area (VTI):     3.43 cm    RVOT Peak grad: 3 mmHg AV Vmax:  97.60 cm/s AV Vmean:          74.200 cm/s AV VTI:            0.211 m AV Peak Grad:      3.8 mmHg AV Mean Grad:      2.0 mmHg LVOT Vmax:         81.80 cm/s LVOT Vmean:        65.600 cm/s LVOT VTI:          0.174 m LVOT/AV VTI ratio: 0.82  AORTA Ao Root diam: 3.70 cm MITRAL VALVE               TRICUSPID VALVE MV Area (PHT): 2.49 cm    TR Peak grad:   19.4 mmHg MV Decel Time: 305 msec    TR Vmax:        220.00 cm/s MV E velocity: 60.40 cm/s MV A velocity: 51.80 cm/s  SHUNTS MV E/A ratio:  1.17        Systemic VTI:  0.17 m                            Systemic Diam: 2.30 cm Debbe OdeaBrian Agbor-Etang MD  Electronically signed by Debbe OdeaBrian Agbor-Etang MD Signature Date/Time: 10/02/2020/5:09:30 PM    Final         Scheduled Meds: . cholecalciferol  5,000 Units Oral Daily  . ferrous gluconate  324 mg Oral BID WC  . melatonin  2.5 mg Oral QHS  . pantoprazole (PROTONIX) IV  40 mg Intravenous Q12H  . vitamin B-12  1,000 mcg Oral Daily   Continuous Infusions: . sodium chloride 20 mL/hr at 10/03/20 0209     LOS: 0 days    Time spent: 30 mins     Charise KillianJamiese M Geraline Halberstadt, MD [facility] Pager [phone]  If 7PM-7AM, please contact night-coverage 10/03/2020, 8:07 AM

## 2020-10-03 NOTE — Anesthesia Postprocedure Evaluation (Signed)
Anesthesia Post Note  Patient: NABIL BUBOLZ  Procedure(s) Performed: ESOPHAGOGASTRODUODENOSCOPY (EGD) (N/A ) COLONOSCOPY (N/A )  Patient location during evaluation: Endoscopy Anesthesia Type: General Level of consciousness: awake and alert Pain management: pain level controlled Vital Signs Assessment: post-procedure vital signs reviewed and stable Respiratory status: spontaneous breathing, nonlabored ventilation, respiratory function stable and patient connected to nasal cannula oxygen Cardiovascular status: blood pressure returned to baseline and stable Postop Assessment: no apparent nausea or vomiting Anesthetic complications: no   No complications documented.   Last Vitals:  Vitals:   10/03/20 1108 10/03/20 1118  BP: 117/71   Pulse: (!) 58 (!) 57  Resp: 10 14  Temp:    SpO2: 99% 100%    Last Pain:  Vitals:   10/03/20 1118  TempSrc:   PainSc: Asleep                 Lenard Simmer

## 2020-10-03 NOTE — Progress Notes (Signed)
Order received from Dr Maximino Greenland for a heart healthy diet

## 2020-10-03 NOTE — Progress Notes (Signed)
Patient being transported to endoscopy

## 2020-10-03 NOTE — Op Note (Signed)
[facility] Patient Name: Trevor Torres Procedure Date: 10/03/2020 9:52 AM MRN: [id number] Account #: [id number] Date of Birth: 1970-07-13 Admit Type: Inpatient Age: 50 Room: [facility] ENDO ROOM 2 Gender: Male Note Status: Finalized Procedure:             Colonoscopy Indications:           Iron deficiency anemia Providers:             Jennipher Weatherholtz B. Bonna Gains MD, MD Referring MD:          Forest Gleason Md, MD (Referring MD) Medicines:             Monitored Anesthesia Care Complications:         No immediate complications. Procedure:             Pre-Anesthesia Assessment:                        - Prior to the procedure, a History and Physical was                         performed, and patient medications, allergies and                         sensitivities were reviewed. The patient's tolerance                         of previous anesthesia was reviewed.                        - The risks and benefits of the procedure and the                         sedation options and risks were discussed with the                         patient. All questions were answered and informed                         consent was obtained.                        - Patient identification and proposed procedure were                         verified prior to the procedure by the physician, the                         nurse, the anesthesiologist, the anesthetist and the                         technician. The procedure was verified in the                         pre-procedure area in the procedure room in the                         endoscopy suite.                        - Prophylactic Antibiotics: The patient  does not                         require prophylactic antibiotics.                        - ASA Grade Assessment: II - A patient with mild                         systemic disease.                        - After reviewing the risks and benefits, the patient                          was deemed in satisfactory condition to undergo the                         procedure.                        - Monitored anesthesia care was determined to be                         medically necessary for this procedure based on review                         of the patient's medical history, medications, and                         prior anesthesia history.                        - The anesthesia plan was to use monitored anesthesia                         care (MAC).                        After obtaining informed consent, the colonoscope was                         passed under direct vision. Throughout the procedure,                         the patient's blood pressure, pulse, and oxygen                         saturations were monitored continuously. The                         Colonoscope was introduced through the anus and                         advanced to the the cecum, identified by appendiceal                         orifice and ileocecal valve. The colonoscopy was                         performed with ease. The  patient tolerated the                         procedure well. The quality of the bowel preparation                         was fair. Findings:      The perianal and digital rectal examinations were normal.      The exam was otherwise normal throughout the examined colon.      Non-bleeding internal hemorrhoids were found during retroflexion. Impression:            - Preparation of the colon was fair.                        - Non-bleeding internal hemorrhoids.                        - No specimens collected. Recommendation:        - Small bowel capsule study as an outpatient for                         evaluation of iron deficiency anemia. With no evidence                         of active GI bleeding and Hgb above 10, no indication                         for urgent capsule study as an inpatient.                        - High fiber diet.                        -  Return patient to hospital ward for ongoing care.                        - Return to GI clinic in 4 weeks.                        - Resume previous diet.                        - The findings and recommendations were discussed with                         the patient. Procedure Code(s):     --- Professional ---                        [phone], Colonoscopy, flexible; diagnostic, including                         collection of specimen(s) by brushing or washing, when                         performed (separate procedure) Diagnosis Code(s):     --- Professional ---                        E83.1, Other hemorrhoids  D50.9, Iron deficiency anemia, unspecified CPT copyright 2019 American Medical Association. All rights reserved. The codes documented in this report are preliminary and upon coder review may  be revised to meet current compliance requirements.  Vonda Antigua, MD Margretta Sidle B. Bonna Gains MD, MD 10/03/2020 10:53:55 AM This report has been signed electronically. Number of Addenda: 0 Note Initiated On: 10/03/2020 9:52 AM Scope Withdrawal Time: 0 hours 12 minutes 45 seconds  Total Procedure Duration: 0 hours 20 minutes 45 seconds  Estimated Blood Loss:  Estimated blood loss: none.      [facility]

## 2020-10-03 NOTE — Op Note (Signed)
[facility] Gastroenterology Patient Name: Trevor Torres Procedure Date: 10/03/2020 9:53 AM MRN: [id number] Account #: [id number] Date of Birth: 09/08/1970 Admit Type: Inpatient Age: 50 Room: [facility] ENDO ROOM 2 Gender: Male Note Status: Finalized Procedure:             Upper GI endoscopy Indications:           Abdominal pain, Dysphagia, Nausea with vomiting Providers:             Dimple Bastyr B. Maximino Greenland MD, MD Referring MD:          Sallye Lat Md, MD (Referring MD) Medicines:             Monitored Anesthesia Care Complications:         No immediate complications. Procedure:             Pre-Anesthesia Assessment:                        - Prior to the procedure, a History and Physical was                         performed, and patient medications, allergies and                         sensitivities were reviewed. The patient's tolerance                         of previous anesthesia was reviewed.                        - The risks and benefits of the procedure and the                         sedation options and risks were discussed with the                         patient. All questions were answered and informed                         consent was obtained.                        - Patient identification and proposed procedure were                         verified prior to the procedure by the physician, the                         nurse, the anesthesiologist, the anesthetist and the                         technician. The procedure was verified in the                         procedure room.                        - ASA Grade Assessment: III - A patient with severe  systemic disease.                        After obtaining informed consent, the endoscope was                         passed under direct vision. Throughout the procedure,                         the patient's blood pressure, pulse, and oxygen                         saturations were  monitored continuously. The Endoscope                         was introduced through the mouth, and advanced to the                         second part of duodenum. The upper GI endoscopy was                         accomplished with ease. The patient tolerated the                         procedure well. Findings:      The examined esophagus was normal. Biopsies were obtained from the       proximal and distal esophagus with cold forceps for histology of       suspected eosinophilic esophagitis. Biopsies were obtained from the       proximal and distal esophagus with cold forceps for histology of       suspected eosinophilic esophagitis.      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 This was       characterized by healthy appearing mucosa.      Patchy mildly erythematous mucosa without bleeding was found in the       gastric antrum. Biopsies were obtained in the gastric body, at the       incisura and in the gastric antrum with cold forceps for histology.      The exam of the stomach was otherwise normal.      Localized mild mucosal changes characterized by scalloping were found in       the duodenal bulb. Biopsies were taken with a cold forceps for histology.      The exam of the duodenum was otherwise normal. Impression:            - Normal esophagus. Biopsied.                        - A sleeve gastrectomy was found, characterized by                         healthy appearing mucosa.                        - Erythematous mucosa in the antrum.                        - Mucosal changes in the duodenum. Biopsied.                        -  Biopsies were obtained in the gastric body, at the                         incisura and in the gastric antrum. Recommendation:        - Await pathology results.                        - Continue present medications.                        - Patient has a contact number available for                         emergencies. The signs and  symptoms of potential                         delayed complications were discussed with the patient.                         Return to normal activities tomorrow. Written                         discharge instructions were provided to the patient.                        - The findings and recommendations were discussed with                         the patient. Procedure Code(s):     --- Professional ---                        [phone], Esophagogastroduodenoscopy, flexible,                         transoral; with biopsy, single or multiple Diagnosis Code(s):     --- Professional ---                        Z98.84, Bariatric surgery status                        K31.89, Other diseases of stomach and duodenum                        R10.9, Unspecified abdominal pain                        R13.10, Dysphagia, unspecified                        R11.2, Nausea with vomiting, unspecified CPT copyright 2019 American Medical Association. All rights reserved. The codes documented in this report are preliminary and upon coder review may  be revised to meet current compliance requirements.  [name], MD Michel Bickers B. Maximino Greenland MD, MD 10/03/2020 10:22:41 AM This report has been signed electronically. Number of Addenda: 0 Note Initiated On: 10/03/2020 9:53 AM Estimated Blood Loss:  Estimated blood loss: none.      Libertas [place]

## 2020-10-03 NOTE — Progress Notes (Signed)
Trevor Bouillon, MD [address], [address], [place], [place], [address] [address], [place], [place], [address] Phone: [phone]  Fax: [phone]   Subjective:  No acute events or issues overnight.  No evidence of active GI bleeding.  Objective: Exam: Vital signs in last 24 hours: Vitals:   10/02/20 2313 10/03/20 0351 10/03/20 0723 10/03/20 0927  BP: 134/89 132/86 (!) 122/96 (!) 128/97  Pulse: 60 62 64 (!) 58  Resp: 16 20 16 17   Temp: 98.2 F (36.8 C) (!) 97.5 F (36.4 C) 97.9 F (36.6 C) (!) 97 F (36.1 C)  TempSrc: Oral Oral  Temporal  SpO2: 100% 99% 100% 100%  Weight:    95.2 kg  Height:    5\' 9"  (1.753 m)   Weight change:   Intake/Output Summary (Last 24 hours) at 10/03/2020 0951 Last data filed at 10/03/2020 0209 Gross per 24 hour  Intake 2043.88 ml  Output --  Net 2043.88 ml    General: No acute distress, AAO x3 Abd: Soft, NT/ND, No HSM Skin: Warm, no rashes Neck: Supple, Trachea midline   Lab Results: Lab Results  Component Value Date   WBC 5.0 10/03/2020   HGB 10.3 (L) 10/03/2020   HCT 33.6 (L) 10/03/2020   MCV 72.1 (L) 10/03/2020   PLT 418 (H) 10/03/2020   Micro Results: Recent Results (from the past 240 hour(s))  Resp Panel by RT-PCR (Flu A&B, Covid) Nasopharyngeal Swab     Status: None   Collection Time: 10/01/20  1:56 PM   Specimen: Nasopharyngeal Swab; Nasopharyngeal(NP) swabs in vial transport medium  Result Value Ref Range Status   SARS Coronavirus 2 by RT PCR NEGATIVE NEGATIVE Final    Comment: (NOTE) SARS-CoV-2 target nucleic acids are NOT DETECTED.  The SARS-CoV-2 RNA is generally detectable in upper respiratory specimens during the acute phase of infection. The lowest concentration of SARS-CoV-2 viral copies this assay can detect is 138 copies/mL. A negative result does not preclude SARS-Cov-2 infection and should not be used as the sole basis for treatment or other patient management decisions. A negative  result may occur with  improper specimen collection/handling, submission of specimen other than nasopharyngeal swab, presence of viral mutation(s) within the areas targeted by this assay, and inadequate number of viral copies(<138 copies/mL). A negative result must be combined with clinical observations, patient history, and epidemiological information. The expected result is Negative.  Fact Sheet for Patients:  10/05/2020  Fact Sheet for Healthcare Providers:  10/03/20  This test is no t yet approved or cleared by the [employer] FDA and  has been authorized for detection and/or diagnosis of SARS-CoV-2 by FDA under an Emergency Use Authorization (EUA). This EUA will remain  in effect (meaning this test can be used) for the duration of the COVID-19 declaration under Section 564(b)(1) of the Act, 21 U.S.C.section 360bbb-3(b)(1), unless the authorization is terminated  or revoked sooner.       Influenza A by PCR NEGATIVE NEGATIVE Final   Influenza B by PCR NEGATIVE NEGATIVE Final    Comment: (NOTE) The Xpert Xpress SARS-CoV-2/FLU/RSV plus assay is intended as an aid in the diagnosis of influenza from Nasopharyngeal swab specimens and should not be used as a sole basis for treatment. Nasal washings and aspirates are unacceptable for Xpert Xpress SARS-CoV-2/FLU/RSV testing.  Fact Sheet for Patients: [employer]it  Fact Sheet for Healthcare Providers: [place]  This test is not yet approved or cleared by the [employer] and has been authorized  for detection and/or diagnosis of SARS-CoV-2 by FDA under an Emergency Use Authorization (EUA). This EUA will remain in effect (meaning this test can be used) for the duration of the COVID-19 declaration under Section 564(b)(1) of the Act, 21 U.S.C. section 360bbb-3(b)(1), unless the authorization is  terminated or revoked.  Performed at [facility], [address] [place], [place] [address]    Studies/Results: CT ABDOMEN PELVIS WO CONTRAST  Result Date: 10/01/2020 CLINICAL DATA:  Epigastric abdominal pain. Remote history of gastric sleeve procedure. EXAM: CT ABDOMEN AND PELVIS WITHOUT CONTRAST TECHNIQUE: Multidetector CT imaging of the abdomen and pelvis was performed following the standard protocol without IV contrast. COMPARISON:  01/11/2018 FINDINGS: Lower chest: The lung bases are clear of an acute process. No pleural effusions or pulmonary lesions. The heart is normal in size. No pericardial effusion. Hepatobiliary: No hepatic lesions are identified without contrast. No intrahepatic biliary dilatation. The gallbladder is surgically absent. No common bile duct dilatation. Pancreas: No obvious mass, acute inflammation ductal dilatation. Spleen: Normal size.  No focal lesions. Adrenals/Urinary Tract: The adrenal glands and kidneys are grossly normal. No renal calculi or obstructing ureteral calculi. The bladder is grossly normal. Stomach/Bowel: The surgical changes are noted along the GE junction from a probable hiatal hernia repair. I do not see any definite findings for recurrent hernia. There are also surgical changes from a gastric sleeve procedure. No complicating features are identified. The duodenum and small bowel are unremarkable. No evidence of colonic mass or colonic obstruction. Vascular/Lymphatic: Minimal scattered aortic calcifications. No aneurysm. No mesenteric or retroperitoneal mass or adenopathy. Reproductive: The prostate gland and seminal vesicles are unremarkable. The Other: No pelvic mass or significant free pelvic fluid collections. No inguinal mass or hernia. Musculoskeletal: No significant bony findings. IMPRESSION: 1. Surgical changes from a probable hiatal hernia repair and gastric sleeve procedure. No complicating features are identified. 2. No acute  abdominal/pelvic findings, mass lesions or adenopathy. 3. Status post cholecystectomy. No biliary dilatation. Electronically Signed   By: Rudie Meyer M.D.   On: 10/01/2020 17:03   ECHOCARDIOGRAM COMPLETE  Result Date: 10/02/2020    ECHOCARDIOGRAM REPORT   Patient Name:   Trevor Torres Date of Exam: 10/02/2020 Medical Rec #:  [id number]      Height:       69.0 in Accession #:    [id number]     Weight:       209.9 lb Date of Birth:  [birth year]     BSA:          2.109 m Patient Age:    49 years       BP:           130/87 mmHg Patient Gender: M              HR:           54 bpm. Exam Location:  [facility] Procedure: 2D Echo, Color Doppler, Cardiac Doppler and Strain Analysis Indications:     Dyspnea R06.00  History:         Patient has no prior history of Echocardiogram examinations.                  Risk Factors:Sleep Apnea and Hypertension.  Sonographer:     Cristela Blue RDCS (AE) Referring Phys:  [id number] Diagnosing Phys: Debbe Odea MD  Sonographer Comments: Global longitudinal strain was attempted. IMPRESSIONS  1. Left ventricular ejection fraction, by estimation, is 50 to 55%. The left ventricle has  low normal function. The left ventricle has no regional wall motion abnormalities. Left ventricular diastolic parameters were normal.  2. Right ventricular systolic function is normal. The right ventricular size is normal.  3. The mitral valve is normal in structure. No evidence of mitral valve regurgitation.  4. The aortic valve is tricuspid. Aortic valve regurgitation is not visualized. FINDINGS  Left Ventricle: Left ventricular ejection fraction, by estimation, is 50 to 55%. The left ventricle has low normal function. The left ventricle has no regional wall motion abnormalities. Global longitudinal strain performed but not reported based on interpreter judgement due to suboptimal tracking. 3D left ventricular ejection fraction analysis performed but not reported based on interpreter judgement due to  suboptimal quality. The left ventricular internal cavity size was normal in size. There is no left ventricular hypertrophy. Left ventricular diastolic parameters were normal. Right Ventricle: The right ventricular size is normal. No increase in right ventricular wall thickness. Right ventricular systolic function is normal. Left Atrium: Left atrial size was normal in size. Right Atrium: Right atrial size was normal in size. Pericardium: Trivial pericardial effusion is present. Mitral Valve: The mitral valve is normal in structure. No evidence of mitral valve regurgitation. Tricuspid Valve: The tricuspid valve is normal in structure. Tricuspid valve regurgitation is mild. Aortic Valve: The aortic valve is tricuspid. Aortic valve regurgitation is not visualized. Aortic valve mean gradient measures 2.0 mmHg. Aortic valve peak gradient measures 3.8 mmHg. Aortic valve area, by VTI measures 3.43 cm. Pulmonic Valve: The pulmonic valve was not well visualized. Pulmonic valve regurgitation is not visualized. Aorta: The aortic root is normal in size and structure. Venous: The inferior vena cava was not well visualized. IAS/Shunts: No atrial level shunt detected by color flow Doppler.  LEFT VENTRICLE PLAX 2D LVIDd:         4.29 cm  Diastology LVIDs:         2.65 cm  LV e' medial:    5.77 cm/s LV PW:         1.11 cm  LV E/e' medial:  10.5 LV IVS:        0.83 cm  LV e' lateral:   13.20 cm/s LVOT diam:     2.30 cm  LV E/e' lateral: 4.6 LV SV:         72 LV SV Index:   34 LVOT Area:     4.15 cm  RIGHT VENTRICLE RV Basal diam:  4.17 cm RV S prime:     15.00 cm/s TAPSE (M-mode): 3.7 cm LEFT ATRIUM           Index       RIGHT ATRIUM           Index LA diam:      3.00 cm 1.42 cm/m  RA Area:     19.60 cm LA Vol (A2C): 37.7 ml 17.88 ml/m RA Volume:   54.40 ml  25.80 ml/m LA Vol (A4C): 21.7 ml 10.29 ml/m  AORTIC VALVE                   PULMONIC VALVE AV Area (Vmax):    3.48 cm    PV Vmax:        0.75 m/s AV Area (Vmean):   3.67  cm    PV Peak grad:   2.2 mmHg AV Area (VTI):     3.43 cm    RVOT Peak grad: 3 mmHg AV Vmax:  97.60 cm/s AV Vmean:          74.200 cm/s AV VTI:            0.211 m AV Peak Grad:      3.8 mmHg AV Mean Grad:      2.0 mmHg LVOT Vmax:         81.80 cm/s LVOT Vmean:        65.600 cm/s LVOT VTI:          0.174 m LVOT/AV VTI ratio: 0.82  AORTA Ao Root diam: 3.70 cm MITRAL VALVE               TRICUSPID VALVE MV Area (PHT): 2.49 cm    TR Peak grad:   19.4 mmHg MV Decel Time: 305 msec    TR Vmax:        220.00 cm/s MV E velocity: 60.40 cm/s MV A velocity: 51.80 cm/s  SHUNTS MV E/A ratio:  1.17        Systemic VTI:  0.17 m                            Systemic Diam: 2.30 cm Debbe OdeaBrian Agbor-Etang MD Electronically signed by Debbe OdeaBrian Agbor-Etang MD Signature Date/Time: 10/02/2020/5:09:30 PM    Final    Medications:  Scheduled Meds: . [MAR Hold] cholecalciferol  5,000 Units Oral Daily  . [MAR Hold] ferrous gluconate  324 mg Oral BID WC  . [MAR Hold] melatonin  2.5 mg Oral QHS  . [MAR Hold] pantoprazole (PROTONIX) IV  40 mg Intravenous Q12H  . [MAR Hold] vitamin B-12  1,000 mcg Oral Daily   Continuous Infusions: . sodium chloride 20 mL/hr at 10/03/20 0930   PRN Meds:.[MAR Hold] acetaminophen, [MAR Hold] dicyclomine, [MAR Hold] ondansetron (ZOFRAN) IV   Assessment: Active Problems:   Near syncope Iron deficiency anemia Nausea and vomiting Dysphagia   Plan: Proceed with upper and lower endoscopy today for further evaluation of new onset iron deficiency anemia  Patient has had chronic nausea vomiting but reports worsening and also has dysphagia  I have discussed alternative options, risks & benefits,  which include, but are not limited to, bleeding, infection, perforation,respiratory complication & drug reaction.  The patient agrees with this plan & written consent will be obtained.      LOS: 0 days   Trevor BouillonVarnita Arif Amendola, MD 10/03/2020, 9:51 AM

## 2020-10-03 NOTE — Anesthesia Preprocedure Evaluation (Signed)
Anesthesia Evaluation  Patient identified by MRN, date of birth, ID band Patient awake    Reviewed: Allergy & Precautions, H&P , NPO status , Patient's Chart, lab work & pertinent test results  History of Anesthesia Complications (+) PONV and history of anesthetic complications  Airway Mallampati: III  TM Distance: >3 FB     Dental  (+) Partial Upper, Dental Advidsory Given   Pulmonary shortness of breath (secondary to anemia), sleep apnea ("occasionally" uses CPAP) and Continuous Positive Airway Pressure Ventilation , neg COPD, neg recent URI,    breath sounds clear to auscultation       Cardiovascular hypertension, (-) angina(-) Past MI and (-) Cardiac Stents (-) dysrhythmias  Rhythm:regular Rate:Normal     Neuro/Psych  Headaches, neg Seizures PSYCHIATRIC DISORDERS Anxiety Depression    GI/Hepatic Neg liver ROS, GERD  Controlled,H/o gastric sleeve   Endo/Other  Hypothyroidism   Renal/GU      Musculoskeletal   Abdominal   Peds  Hematology  (+) Blood dyscrasia, anemia ,   Anesthesia Other Findings Past Medical History: No date: Anxiety 01-2015: Cellulitis     Comment:  left arm No date: GERD (gastroesophageal reflux disease)     Comment:  H/O PRIOR TO WEIGHT LOSS No date: Headache 01/07/2015: HTN (hypertension)     Comment:  H/O/ GASTRIC SLEEVE /WT LOSS No date: Hypothyroidism     Comment:  not since weight loss 2014: MRSA infection     Comment:  h/o years ago No date: Renal insufficiency     Comment:  error in entry No date: Sleep apnea     Comment:  H/O PRIOR TO WEIGHT LOSS  Past Surgical History: 01/14/2018: CHOLECYSTECTOMY; N/A     Comment:  Procedure: LAPAROSCOPIC CHOLECYSTECTOMY;  Surgeon:               Ancil Linsey, MD;  Location: [facility] ORS;  Service:               General;  Laterality: N/A; 2015: COLONOSCOPY 2015: ESOPHAGOGASTRODUODENOSCOPY 2013: LAPAROSCOPIC GASTRIC SLEEVE  RESECTION 02/06/2015: SHOULDER ARTHROSCOPY WITH OPEN ROTATOR CUFF REPAIR; Right     Comment:  Procedure: SHOULDER ARTHROSCOPY WITH OPEN ROTATOR CUFF               REPAIR;  Surgeon: Juanell Fairly, MD;  Location: [facility]               ORS;  Service: Orthopedics;  Laterality: Right;     Reproductive/Obstetrics negative OB ROS                             Anesthesia Physical  Anesthesia Plan  ASA: II  Anesthesia Plan: General   Post-op Pain Management:    Induction: Intravenous  PONV Risk Score and Plan: TIVA and Propofol infusion  Airway Management Planned: Natural Airway and Nasal Cannula  Additional Equipment:   Intra-op Plan:   Post-operative Plan:   Informed Consent: I have reviewed the patients History and Physical, chart, labs and discussed the procedure including the risks, benefits and alternatives for the proposed anesthesia with the patient or authorized representative who has indicated his/her understanding and acceptance.     Dental Advisory Given  Plan Discussed with: Anesthesiologist, CRNA and Surgeon  Anesthesia Plan Comments:         Anesthesia Quick Evaluation

## 2020-10-04 ENCOUNTER — Encounter: Payer: Self-pay | Admitting: Gastroenterology

## 2020-10-04 DIAGNOSIS — Z20822 Contact with and (suspected) exposure to covid-19: Secondary | ICD-10-CM | POA: Diagnosis present

## 2020-10-04 DIAGNOSIS — E039 Hypothyroidism, unspecified: Secondary | ICD-10-CM | POA: Diagnosis present

## 2020-10-04 DIAGNOSIS — D509 Iron deficiency anemia, unspecified: Secondary | ICD-10-CM | POA: Diagnosis present

## 2020-10-04 DIAGNOSIS — G4700 Insomnia, unspecified: Secondary | ICD-10-CM | POA: Diagnosis present

## 2020-10-04 DIAGNOSIS — R0789 Other chest pain: Secondary | ICD-10-CM | POA: Diagnosis present

## 2020-10-04 DIAGNOSIS — F419 Anxiety disorder, unspecified: Secondary | ICD-10-CM | POA: Diagnosis present

## 2020-10-04 DIAGNOSIS — R112 Nausea with vomiting, unspecified: Secondary | ICD-10-CM | POA: Diagnosis present

## 2020-10-04 DIAGNOSIS — E871 Hypo-osmolality and hyponatremia: Secondary | ICD-10-CM | POA: Diagnosis present

## 2020-10-04 DIAGNOSIS — Z9049 Acquired absence of other specified parts of digestive tract: Secondary | ICD-10-CM | POA: Diagnosis not present

## 2020-10-04 DIAGNOSIS — E538 Deficiency of other specified B group vitamins: Secondary | ICD-10-CM | POA: Diagnosis present

## 2020-10-04 DIAGNOSIS — G4730 Sleep apnea, unspecified: Secondary | ICD-10-CM | POA: Diagnosis present

## 2020-10-04 DIAGNOSIS — I10 Essential (primary) hypertension: Secondary | ICD-10-CM | POA: Diagnosis present

## 2020-10-04 DIAGNOSIS — K648 Other hemorrhoids: Secondary | ICD-10-CM | POA: Diagnosis present

## 2020-10-04 DIAGNOSIS — K219 Gastro-esophageal reflux disease without esophagitis: Secondary | ICD-10-CM | POA: Diagnosis present

## 2020-10-04 DIAGNOSIS — Z79899 Other long term (current) drug therapy: Secondary | ICD-10-CM | POA: Diagnosis not present

## 2020-10-04 DIAGNOSIS — Z9884 Bariatric surgery status: Secondary | ICD-10-CM | POA: Diagnosis not present

## 2020-10-04 DIAGNOSIS — R55 Syncope and collapse: Secondary | ICD-10-CM | POA: Diagnosis present

## 2020-10-04 DIAGNOSIS — R1314 Dysphagia, pharyngoesophageal phase: Secondary | ICD-10-CM | POA: Diagnosis present

## 2020-10-04 DIAGNOSIS — R1319 Other dysphagia: Secondary | ICD-10-CM | POA: Diagnosis not present

## 2020-10-04 DIAGNOSIS — D75839 Thrombocytosis, unspecified: Secondary | ICD-10-CM | POA: Diagnosis present

## 2020-10-04 DIAGNOSIS — Z8614 Personal history of Methicillin resistant Staphylococcus aureus infection: Secondary | ICD-10-CM | POA: Diagnosis not present

## 2020-10-04 DIAGNOSIS — Z8249 Family history of ischemic heart disease and other diseases of the circulatory system: Secondary | ICD-10-CM | POA: Diagnosis not present

## 2020-10-04 DIAGNOSIS — D518 Other vitamin B12 deficiency anemias: Secondary | ICD-10-CM | POA: Diagnosis not present

## 2020-10-04 DIAGNOSIS — Z886 Allergy status to analgesic agent status: Secondary | ICD-10-CM | POA: Diagnosis not present

## 2020-10-04 LAB — CBC
HCT: 34.5 % — ABNORMAL LOW (ref 39.0–52.0)
Hemoglobin: 10.9 g/dL — ABNORMAL LOW (ref 13.0–17.0)
MCH: 22.6 pg — ABNORMAL LOW (ref 26.0–34.0)
MCHC: 31.6 g/dL (ref 30.0–36.0)
MCV: 71.4 fL — ABNORMAL LOW (ref 80.0–100.0)
Platelets: 435 10*3/uL — ABNORMAL HIGH (ref 150–400)
RBC: 4.83 MIL/uL (ref 4.22–5.81)
RDW: 17.2 % — ABNORMAL HIGH (ref 11.5–15.5)
WBC: 7.3 10*3/uL (ref 4.0–10.5)
nRBC: 0 % (ref 0.0–0.2)

## 2020-10-04 LAB — BASIC METABOLIC PANEL
Anion gap: 7 (ref 5–15)
BUN: 11 mg/dL (ref 6–20)
CO2: 23 mmol/L (ref 22–32)
Calcium: 8.6 mg/dL — ABNORMAL LOW (ref 8.9–10.3)
Chloride: 102 mmol/L (ref 98–111)
Creatinine, Ser: 0.98 mg/dL (ref 0.61–1.24)
GFR, Estimated: >60 mL/min (ref >60)
Glucose, Bld: 79 mg/dL (ref 70–99)
Potassium: 4.2 mmol/L (ref 3.5–5.1)
Sodium: 132 mmol/L — ABNORMAL LOW (ref 135–145)

## 2020-10-04 LAB — SURGICAL PATHOLOGY

## 2020-10-04 MED ORDER — CYANOCOBALAMIN 1000 MCG PO TABS: 1000.0000 ug | ORAL_TABLET | Freq: Every day | ORAL | 0 refills | Status: AC

## 2020-10-04 MED ORDER — TRAZODONE HCL 50 MG PO TABS
25.0000 mg | ORAL_TABLET | Freq: Every evening | ORAL | Status: DC | PRN
  Administered 2020-10-04: 25 mg via ORAL
  Filled 2020-10-04: qty 1

## 2020-10-04 MED ORDER — TRAZODONE HCL 50 MG PO TABS: 25.0000 mg | ORAL_TABLET | Freq: Every evening | ORAL | 0 refills | Status: AC | PRN

## 2020-10-04 MED ORDER — FERROUS GLUCONATE 324 (38 FE) MG PO TABS: 324.0000 mg | ORAL_TABLET | Freq: Two times a day (BID) | ORAL | 0 refills | Status: AC

## 2020-10-04 NOTE — Plan of Care (Signed)
Discharge teaching completed with patient who is in stable condition. 

## 2020-10-04 NOTE — Discharge Summary (Addendum)
Physician Discharge Summary  Trevor Torres:[id number] DOB: 08-Oct-1970 DOA: 10/01/2020  PCP: System, Provider Not In  Admit date: 10/01/2020 Discharge date: 10/04/2020  Admitted From: home  Disposition: home  Recommendations for Outpatient Follow-up:  1. Follow up with PCP in 1-2 weeks 2. F/u w/ GI, Dr. Maximino Greenland in 3-4 weeks  Home Health:  Equipment/Devices:  Discharge Condition: stable  CODE STATUS: full  Diet recommendation: High fiber diet  Brief/Interim Summary: HPI was taken from Dr. 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 -He reports feeling weak for the last 2 days which he attributed to his job, today continued to feel weak and nauseous, associated with episode where he felt he was going to pass out, subsequently went to nurses office at his workplace as blood pressure was noted to be unusually high at 180.  He was then asked to come to the emergency room -While in the ED started to experience nausea and epigastric abdominal pain and lower abdominal cramps, no vomiting, denies any diarrhea. 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 Mayford Knife 5/18-5/20/22: Pt presented w/ near syncope & chest discomfort of unknown etiology. Echo was done and was WNL. Troponins were neg and no acute ischemic changes were noted on EKG. Furthermore, pt c/o vomiting after eating solids x several years and has not improved. GI was  consulted and EGD was done which showed mucosal changes in the duodenum, biopsied & biopsies were taken at the gastric body, incisura & gastric antrum. Pt will get pathology results from GI as an outpatient. Pt is s/p colonoscopy showed non-bleeding internal hemorrhoids.Of note, pt was found to have IDA and start iron supplements as well as B12 deficiency and was started on B12 supplements. For more information, please see previous progress/consult notes.   Discharge Diagnoses:  Active Problems:   Near syncope   Intractable vomiting   Esophageal dysphagia   Abdominal pain   Gastric erythema   Iron deficiency anemia   Intractable nausea and vomiting  Vomiting after eating solids/esophageal dysphagia: etiology unclear. Hx of gastric sleeve. CT abd/pelvis shows no acute reason for vomiting after eating solids. S/p EGD which showed mucosal changes in the duodenum, biopsied & biopsies taken at the gastric body, incisura & gastric antrum. S/p colonoscopy showed non-bleeding internal hemorrhoids.   Near syncope: etiology unclear. Echo shows EF of 50-55%, normal diastolic function & no regional wall motion abnormalities   Chest discomfort: troponins neg. EKG shows no acute ischemic changes. Echo shows EF 50-55%, normal diastolic function & no regional wall motion abnormalities. Resolved    IDA: will continue on iron supplement   Thrombocytosis: etiology unclear. Will continue to monitor   Hyponatremia: trending up daily. 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 B12 is low so will continue on B12 supplement   B12 deficiency: continue on B12 supplement. Likely secondary to gastric sleeve   Insomnia: started on trazodone and this will continue at d/c  Discharge Instructions  Discharge Instructions    Diet general   Complete by: As directed    High fiber diet   Discharge instructions   Complete by: As directed    F/u w/ GI, Dr. Maximino Greenlandahiliani, in 3-4 weeks. F/u w/  PCP in 1-2 weeks   Increase activity slowly   Complete by: As directed      Allergies as of 10/04/2020      Reactions   Nsaids Other (See Comments)   Pt is unable to take due to gastric sleeve surgery.      Medication List    STOP taking these medications   meloxicam 15 MG tablet Commonly known as: MOBIC     TAKE these medications   acetaminophen 500 MG tablet Commonly known as: TYLENOL Take 500-1,000 mg by mouth every 6 (six) hours as needed (for pain.).   cyanocobalamin 1000 MCG tablet Take 1 tablet (1,000 mcg total) by mouth daily. Start taking on: Oct 05, 2020   ferrous gluconate 324 MG tablet Commonly known as: FERGON Take 1 tablet (324 mg total) by mouth 2 (two) times daily with a meal.   ibuprofen 600 MG tablet Commonly known as: ADVIL Take 1 tablet (600 mg total) by mouth every 6 (six) hours as needed.   methocarbamol 500 MG tablet Commonly known as: ROBAXIN Take 1 tablet (500 mg total) by mouth 2 (two) times daily.   multivitamin with minerals Tabs tablet Take 1 tablet by mouth daily.   traZODone 50 MG tablet Commonly known as: DESYREL Take 0.5 tablets (25 mg total) by mouth at bedtime as needed for sleep.   Vitamin D3 125 MCG (5000 UT) Caps Take 1 capsule by mouth daily.   zinc gluconate 50 MG tablet Take 50 mg by mouth daily.       Follow-up Information    Pasty Spillersahiliani, Varnita B, MD Follow up in 3 week(s).   Specialty: Gastroenterology Contact information: [address] [place] [id number] [phone]              Allergies  Allergen Reactions  . Nsaids Other (See Comments)    Pt is unable to take due to gastric sleeve surgery.    Consultations:  GI, Dr. Maximino Greenlandahiliani    Procedures/Studies: CT ABDOMEN PELVIS WO CONTRAST  Result Date: 10/01/2020 CLINICAL DATA:  Epigastric abdominal pain. Remote history of gastric sleeve procedure. EXAM: CT ABDOMEN AND PELVIS WITHOUT CONTRAST TECHNIQUE: Multidetector CT imaging of the  abdomen and pelvis was performed following the standard protocol without IV contrast. COMPARISON:  01/11/2018 FINDINGS: Lower chest: The lung bases are clear of an acute process. No pleural effusions or pulmonary lesions. The heart is normal in size. No pericardial effusion. Hepatobiliary: No hepatic lesions are identified without contrast. No intrahepatic biliary dilatation. The gallbladder is surgically absent. No common bile duct dilatation. Pancreas: No obvious mass, acute inflammation ductal dilatation. Spleen: Normal size.  No focal lesions. Adrenals/Urinary Tract: The adrenal glands and kidneys are grossly normal. No renal calculi or obstructing ureteral calculi. The bladder is grossly normal. Stomach/Bowel: The surgical changes are noted along the GE junction from a probable hiatal hernia repair. I do not see any definite findings for recurrent hernia. There are also surgical changes from a gastric sleeve procedure. No complicating features are identified. The duodenum and small bowel are unremarkable. No evidence of colonic mass or colonic obstruction. Vascular/Lymphatic: Minimal scattered aortic calcifications. No aneurysm. No mesenteric or retroperitoneal mass or adenopathy. Reproductive: The prostate gland and seminal  vesicles are unremarkable. The Other: No pelvic mass or significant free pelvic fluid collections. No inguinal mass or hernia. Musculoskeletal: No significant bony findings. IMPRESSION: 1. Surgical changes from a probable hiatal hernia repair and gastric sleeve procedure. No complicating features are identified. 2. No acute abdominal/pelvic findings, mass lesions or adenopathy. 3. Status post cholecystectomy. No biliary dilatation. Electronically Signed   By: Rudie Meyer M.D.   On: 10/01/2020 17:03   ECHOCARDIOGRAM COMPLETE  Result Date: 10/02/2020    ECHOCARDIOGRAM REPORT   Patient Name:   KIRKE BREACH Date of Exam: 10/02/2020 Medical Rec #:  [id number]      Height:       69.0 in  Accession #:    [id number]     Weight:       209.9 lb Date of Birth:  March 31, 1971     BSA:          2.109 m Patient Age:    49 years       BP:           130/87 mmHg Patient Gender: M              HR:           54 bpm. Exam Location:  [facility] Procedure: 2D Echo, Color Doppler, Cardiac Doppler and Strain Analysis Indications:     Dyspnea R06.00  History:         Patient has no prior history of Echocardiogram examinations.                  Risk Factors:Sleep Apnea and Hypertension.  Sonographer:     Cristela Blue RDCS (AE) Referring Phys:  [id number] Diagnosing Phys: Debbe Odea MD  Sonographer Comments: Global longitudinal strain was attempted. IMPRESSIONS  1. Left ventricular ejection fraction, by estimation, is 50 to 55%. The left ventricle has low normal function. The left ventricle has no regional wall motion abnormalities. Left ventricular diastolic parameters were normal.  2. Right ventricular systolic function is normal. The right ventricular size is normal.  3. The mitral valve is normal in structure. No evidence of mitral valve regurgitation.  4. The aortic valve is tricuspid. Aortic valve regurgitation is not visualized. FINDINGS  Left Ventricle: Left ventricular ejection fraction, by estimation, is 50 to 55%. The left ventricle has low normal function. The left ventricle has no regional wall motion abnormalities. Global longitudinal strain performed but not reported based on interpreter judgement due to suboptimal tracking. 3D left ventricular ejection fraction analysis performed but not reported based on interpreter judgement due to suboptimal quality. The left ventricular internal cavity size was normal in size. There is no left ventricular hypertrophy. Left ventricular diastolic parameters were normal. Right Ventricle: The right ventricular size is normal. No increase in right ventricular wall thickness. Right ventricular systolic function is normal. Left Atrium: Left atrial size was normal in  size. Right Atrium: Right atrial size was normal in size. Pericardium: Trivial pericardial effusion is present. Mitral Valve: The mitral valve is normal in structure. No evidence of mitral valve regurgitation. Tricuspid Valve: The tricuspid valve is normal in structure. Tricuspid valve regurgitation is mild. Aortic Valve: The aortic valve is tricuspid. Aortic valve regurgitation is not visualized. Aortic valve mean gradient measures 2.0 mmHg. Aortic valve peak gradient measures 3.8 mmHg. Aortic valve area, by VTI measures 3.43 cm. Pulmonic Valve: The pulmonic valve was not well visualized. Pulmonic valve regurgitation is not visualized. Aorta: The aortic root is normal in size and  structure. Venous: The inferior vena cava was not well visualized. IAS/Shunts: No atrial level shunt detected by color flow Doppler.  LEFT VENTRICLE PLAX 2D LVIDd:         4.29 cm  Diastology LVIDs:         2.65 cm  LV e' medial:    5.77 cm/s LV PW:         1.11 cm  LV E/e' medial:  10.5 LV IVS:        0.83 cm  LV e' lateral:   13.20 cm/s LVOT diam:     2.30 cm  LV E/e' lateral: 4.6 LV SV:         72 LV SV Index:   34 LVOT Area:     4.15 cm  RIGHT VENTRICLE RV Basal diam:  4.17 cm RV S prime:     15.00 cm/s TAPSE (M-mode): 3.7 cm LEFT ATRIUM           Index       RIGHT ATRIUM           Index LA diam:      3.00 cm 1.42 cm/m  RA Area:     19.60 cm LA Vol (A2C): 37.7 ml 17.88 ml/m RA Volume:   54.40 ml  25.80 ml/m LA Vol (A4C): 21.7 ml 10.29 ml/m  AORTIC VALVE                   PULMONIC VALVE AV Area (Vmax):    3.48 cm    PV Vmax:        0.75 m/s AV Area (Vmean):   3.67 cm    PV Peak grad:   2.2 mmHg AV Area (VTI):     3.43 cm    RVOT Peak grad: 3 mmHg AV Vmax:           97.60 cm/s AV Vmean:          74.200 cm/s AV VTI:            0.211 m AV Peak Grad:      3.8 mmHg AV Mean Grad:      2.0 mmHg LVOT Vmax:         81.80 cm/s LVOT Vmean:        65.600 cm/s LVOT VTI:          0.174 m LVOT/AV VTI ratio: 0.82  AORTA Ao Root diam: 3.70 cm  MITRAL VALVE               TRICUSPID VALVE MV Area (PHT): 2.49 cm    TR Peak grad:   19.4 mmHg MV Decel Time: 305 msec    TR Vmax:        220.00 cm/s MV E velocity: 60.40 cm/s MV A velocity: 51.80 cm/s  SHUNTS MV E/A ratio:  1.17        Systemic VTI:  0.17 m                            Systemic Diam: 2.30 cm Debbe Odea MD Electronically signed by Debbe Odea MD Signature Date/Time: 10/02/2020/5:09:30 PM    Final       Subjective: Pt c/o fatigue    Discharge Exam: Vitals:   10/04/20 0436 10/04/20 1125  BP: 128/88 121/83  Pulse: (!) 59 (!) 55  Resp: 20 18  Temp: 97.8 F (36.6 C) (!) 97.5 F (36.4 C)  SpO2: 100% 100%  Vitals:   10/03/20 2012 10/03/20 2353 10/04/20 0436 10/04/20 1125  BP: 136/90 135/89 128/88 121/83  Pulse: (!) 47 (!) 55 (!) 59 (!) 55  Resp: Temp: 98.1 F (36.7 C) (!) 97.5 F (36.4 C) 97.8 F (36.6 C) (!) 97.5 F (36.4 C)  TempSrc: Oral Oral Oral   SpO2: 100% 93% 100% 100%  Weight:      Height:        General: Pt is alert, awake, not in acute distress Cardiovascular:S1/S2 +, no rubs, no gallops Respiratory: CTA bilaterally, no wheezing, no rhonchi Abdominal: Soft, NT, ND, bowel sounds + Extremities: no edema, no cyanosis    The results of significant diagnostics from this hospitalization (including imaging, microbiology, ancillary and laboratory) are listed below for reference.     Microbiology: Recent Results (from the past 240 hour(s))  Resp Panel by RT-PCR (Flu A&B, Covid) Nasopharyngeal Swab     Status: None   Collection Time: 10/01/20  1:56 PM   Specimen: Nasopharyngeal Swab; Nasopharyngeal(NP) swabs in vial transport medium  Result Value Ref Range Status   SARS Coronavirus 2 by RT PCR NEGATIVE NEGATIVE Final    Comment: (NOTE) SARS-CoV-2 target nucleic acids are NOT DETECTED.  The SARS-CoV-2 RNA is generally detectable in upper respiratory specimens during the acute phase of infection. The lowest concentration of  SARS-CoV-2 viral copies this assay can detect is 138 copies/mL. A negative result does not preclude SARS-Cov-2 infection and should not be used as the sole basis for treatment or other patient management decisions. A negative result may occur with  improper specimen collection/handling, submission of specimen other than nasopharyngeal swab, presence of viral mutation(s) within the areas targeted by this assay, and inadequate number of viral copies(<138 copies/mL). A negative result must be combined with clinical observations, patient history, and epidemiological information. The expected result is Negative.  Fact Sheet for Patients:  BloggerCourse.com  Fact Sheet for Healthcare Providers:  SeriousBroker.it  This test is no t yet approved or cleared by the [place] FDA and  has been authorized for detection and/or diagnosis of SARS-CoV-2 by FDA under an Emergency Use Authorization (EUA). This EUA will remain  in effect (meaning this test can be used) for the duration of the COVID-19 declaration under Section 564(b)(1) of the Act, 21 U.S.C.section 360bbb-3(b)(1), unless the authorization is terminated  or revoked sooner.       Influenza A by PCR NEGATIVE NEGATIVE Final   Influenza B by PCR NEGATIVE NEGATIVE Final    Comment: (NOTE) The Xpert Xpress SARS-CoV-2/FLU/RSV plus assay is intended as an aid in the diagnosis of influenza from Nasopharyngeal swab specimens and should not be used as a sole basis for treatment. Nasal washings and aspirates are unacceptable for Xpert Xpress SARS-CoV-2/FLU/RSV testing.  Fact Sheet for Patients: BloggerCourse.com  Fact Sheet for Healthcare Providers: SeriousBroker.it  This test is not yet approved or cleared by the [place] FDA and has been authorized for detection and/or diagnosis of SARS-CoV-2 by FDA under an Emergency Use  Authorization (EUA). This EUA will remain in effect (meaning this test can be used) for the duration of the COVID-19 declaration under Section 564(b)(1) of the Act, 21 U.S.C. section 360bbb-3(b)(1), unless the authorization is terminated or revoked.  Performed at [facility], [address] [place], [place] [address]      Labs: BNP (last 3 results) No results for input(s): BNP in the last 8760 hours. Basic Metabolic Panel: Recent Labs  Lab  10/01/20 1225 10/02/20 0440 10/03/20 0400 10/04/20 0505  NA 125* 130* 131* 132*  K 4.4 4.2 3.9 4.2  CL 94* 99 101 102  CO2 GLUCOSE 89 84 79 79  BUN CREATININE 0.99 0.87 0.83 0.98  CALCIUM 9.2 8.8* 8.4* 8.6*   Liver Function Tests: Recent Labs  Lab 10/01/20 1225 10/02/20 0440  AST 42* 34  ALT 37 31  ALKPHOS 37* 34*  BILITOT 0.9 1.0  PROT 7.0 6.7  ALBUMIN 4.3 3.8   No results for input(s): LIPASE, AMYLASE in the last 168 hours. No results for input(s): AMMONIA in the last 168 hours. CBC: Recent Labs  Lab 10/01/20 1225 10/02/20 0440 10/03/20 0400 10/04/20 0505  WBC 5.6 6.2 5.0 7.3  HGB 10.6* 11.1* 10.3* 10.9*  HCT 33.9* 35.3* 33.6* 34.5*  MCV 71.1* 70.7* 72.1* 71.4*  PLT 462* 428* 418* 435*   Cardiac Enzymes: No results for input(s): CKTOTAL, CKMB, CKMBINDEX, TROPONINI in the last 168 hours. BNP: Invalid input(s): POCBNP CBG: No results for input(s): GLUCAP in the last 168 hours. D-Dimer Recent Labs    10/01/20 1225  DDIMER 0.46   Hgb A1c No results for input(s): HGBA1C in the last 72 hours. Lipid Profile No results for input(s): CHOL, HDL, LDLCALC, TRIG, CHOLHDL, LDLDIRECT in the last 72 hours. Thyroid function studies Recent Labs    10/01/20 2211  TSH 4.896*   Anemia work up Recent Labs    10/01/20 1225 10/01/20 2211  VITAMINB12  --  60*  FOLATE 29.0  --   FERRITIN 9*  --   TIBC 427  --   IRON 16*  --   RETICCTPCT 1.3  --    Urinalysis    Component Value  Date/Time   COLORURINE STRAW (A) 10/01/2020 1357   APPEARANCEUR CLEAR (A) 10/01/2020 1357   APPEARANCEUR Clear 11/29/2013 1233   LABSPEC 1.006 10/01/2020 1357   LABSPEC 1.011 11/29/2013 1233   PHURINE 6.0 10/01/2020 1357   GLUCOSEU NEGATIVE 10/01/2020 1357   GLUCOSEU Negative 11/29/2013 1233   HGBUR NEGATIVE 10/01/2020 1357   BILIRUBINUR NEGATIVE 10/01/2020 1357   BILIRUBINUR Negative 11/29/2013 1233   KETONESUR NEGATIVE 10/01/2020 1357   PROTEINUR NEGATIVE 10/01/2020 1357   UROBILINOGEN 1.0 09/06/2014 1204   NITRITE NEGATIVE 10/01/2020 1357   LEUKOCYTESUR NEGATIVE 10/01/2020 1357   LEUKOCYTESUR Negative 11/29/2013 1233   Sepsis Labs Invalid input(s): PROCALCITONIN,  WBC,  LACTICIDVEN Microbiology Recent Results (from the past 240 hour(s))  Resp Panel by RT-PCR (Flu A&B, Covid) Nasopharyngeal Swab     Status: None   Collection Time: 10/01/20  1:56 PM   Specimen: Nasopharyngeal Swab; Nasopharyngeal(NP) swabs in vial transport medium  Result Value Ref Range Status   SARS Coronavirus 2 by RT PCR NEGATIVE NEGATIVE Final    Comment: (NOTE) SARS-CoV-2 target nucleic acids are NOT DETECTED.  The SARS-CoV-2 RNA is generally detectable in upper respiratory specimens during the acute phase of infection. The lowest concentration of SARS-CoV-2 viral copies this assay can detect is 138 copies/mL. A negative result does not preclude SARS-Cov-2 infection and should not be used as the sole basis for treatment or other patient management decisions. A negative result may occur with  improper specimen collection/handling, submission of specimen other than nasopharyngeal swab, presence of viral mutation(s) within the areas targeted by this assay, and inadequate number of viral copies(<138 copies/mL). A negative result must be combined with clinical observations, patient history, and epidemiological information. The expected result  is Negative.  Fact Sheet for Patients:   BloggerCourse.com  Fact Sheet for Healthcare Providers:  SeriousBroker.it  This test is no t yet approved or cleared by the [place] FDA and  has been authorized for detection and/or diagnosis of SARS-CoV-2 by FDA under an Emergency Use Authorization (EUA). This EUA will remain  in effect (meaning this test can be used) for the duration of the COVID-19 declaration under Section 564(b)(1) of the Act, 21 U.S.C.section 360bbb-3(b)(1), unless the authorization is terminated  or revoked sooner.       Influenza A by PCR NEGATIVE NEGATIVE Final   Influenza B by PCR NEGATIVE NEGATIVE Final    Comment: (NOTE) The Xpert Xpress SARS-CoV-2/FLU/RSV plus assay is intended as an aid in the diagnosis of influenza from Nasopharyngeal swab specimens and should not be used as a sole basis for treatment. Nasal washings and aspirates are unacceptable for Xpert Xpress SARS-CoV-2/FLU/RSV testing.  Fact Sheet for Patients: BloggerCourse.com  Fact Sheet for Healthcare Providers: SeriousBroker.it  This test is not yet approved or cleared by the [place] FDA and has been authorized for detection and/or diagnosis of SARS-CoV-2 by FDA under an Emergency Use Authorization (EUA). This EUA will remain in effect (meaning this test can be used) for the duration of the COVID-19 declaration under Section 564(b)(1) of the Act, 21 U.S.C. section 360bbb-3(b)(1), unless the authorization is terminated or revoked.  Performed at [facility], [address], [place], [place] [address]      Time coordinating discharge: Over 30 minutes  SIGNED:   Charise Killian, MD  [facility] 10/04/2020, 11:55 AM Pager   If 7PM-7AM, please contact night-coverage

## 2020-10-04 NOTE — Progress Notes (Signed)
Pt states he threw up all his dinner last evening. Prn Zofran adm once for nausea. Safety measures in place. Will continue to monitor.

## 2020-10-04 NOTE — Plan of Care (Signed)

## 2020-10-16 ENCOUNTER — Other Ambulatory Visit: Payer: Self-pay

## 2020-10-16 ENCOUNTER — Encounter: Payer: Self-pay | Admitting: Emergency Medicine

## 2020-10-16 ENCOUNTER — Emergency Department
Admission: EM | Admit: 2020-10-16 | Discharge: 2020-10-16 | Disposition: A | Discharge status: Home / Self Care | Payer: Managed Care, Other (non HMO) | Attending: Emergency Medicine | Admitting: Emergency Medicine

## 2020-10-16 DIAGNOSIS — E039 Hypothyroidism, unspecified: Secondary | ICD-10-CM | POA: Insufficient documentation

## 2020-10-16 DIAGNOSIS — R5383 Other fatigue: Secondary | ICD-10-CM | POA: Diagnosis not present

## 2020-10-16 DIAGNOSIS — E871 Hypo-osmolality and hyponatremia: Secondary | ICD-10-CM

## 2020-10-16 DIAGNOSIS — I10 Essential (primary) hypertension: Secondary | ICD-10-CM | POA: Diagnosis not present

## 2020-10-16 DIAGNOSIS — R799 Abnormal finding of blood chemistry, unspecified: Secondary | ICD-10-CM | POA: Diagnosis present

## 2020-10-16 LAB — URINALYSIS, COMPLETE (UACMP) WITH MICROSCOPIC
Bacteria, UA: NONE SEEN
Bilirubin Urine: NEGATIVE
Glucose, UA: NEGATIVE mg/dL
Hgb urine dipstick: NEGATIVE
Ketones, ur: NEGATIVE mg/dL
Leukocytes,Ua: NEGATIVE
Nitrite: NEGATIVE
Protein, ur: NEGATIVE mg/dL
Specific Gravity, Urine: 1.008 (ref 1.005–1.030)
Squamous Epithelial / HPF: NONE SEEN (ref 0–5)
pH: 7 (ref 5.0–8.0)

## 2020-10-16 LAB — COMPREHENSIVE METABOLIC PANEL
ALT: 29 U/L (ref 0–44)
AST: 33 U/L (ref 15–41)
Albumin: 4.1 g/dL (ref 3.5–5.0)
Alkaline Phosphatase: 33 U/L — ABNORMAL LOW (ref 38–126)
Anion gap: 11 (ref 5–15)
BUN: 22 mg/dL — ABNORMAL HIGH (ref 6–20)
CO2: 24 mmol/L (ref 22–32)
Calcium: 9.2 mg/dL (ref 8.9–10.3)
Chloride: 96 mmol/L — ABNORMAL LOW (ref 98–111)
Creatinine, Ser: 1.07 mg/dL (ref 0.61–1.24)
GFR, Estimated: >60 mL/min (ref >60)
Glucose, Bld: 84 mg/dL (ref 70–99)
Potassium: 4.1 mmol/L (ref 3.5–5.1)
Sodium: 131 mmol/L — ABNORMAL LOW (ref 135–145)
Total Bilirubin: 1.3 mg/dL — ABNORMAL HIGH (ref 0.3–1.2)
Total Protein: 6.8 g/dL (ref 6.5–8.1)

## 2020-10-16 LAB — TSH: TSH: 2.871 u[IU]/mL (ref 0.350–4.500)

## 2020-10-16 LAB — OSMOLALITY, URINE: Osmolality, Ur: 277 mOsm/kg — ABNORMAL LOW (ref 300–900)

## 2020-10-16 LAB — T4, FREE: Free T4: 0.63 ng/dL (ref 0.61–1.12)

## 2020-10-16 LAB — SODIUM, URINE, RANDOM: Sodium, Ur: 33 mmol/L

## 2020-10-16 LAB — OSMOLALITY: Osmolality: 278 mOsm/kg (ref 275–295)

## 2020-10-16 NOTE — ED Provider Notes (Signed)
[facility] Emergency Department Provider Note  ____________________________________________  Time seen: Approximately 11:29 PM  I have reviewed the triage vital signs and the nursing notes.   HISTORY  Chief Complaint Abnormal Lab    HPI Trevor Torres is a 50 y.o. male with a history of GERD, hypertension, resolved hypothyroidism who comes ED complaining of fatigue for the past several weeks.  He was recently hospitalized for generalized weakness and hyponatremia with a sodium of about 131.  Work-up at that time was overall unremarkable and he ended up being discharged home.  He has follow-up with gastroenterology tomorrow and with primary care in 12 days.  Taking B12 and iron supplements as prescribed at his recent hospitalization.  Denies black or bloody stool, no vomiting or diarrhea, eating and drinking normally.  He is very deliberate about his diet.  Has a history of bariatric gastric sleeve surgery 7 years ago.   No chest pain shortness of breath or cough, no weight loss.  He does feel cold all the time.  Fatigue symptoms are constant, no aggravating or alleviating factors.  Denies pain.   Past Medical History:  Diagnosis Date  . Anxiety   . Cellulitis 01-2015   left arm  . GERD (gastroesophageal reflux disease)    H/O PRIOR TO WEIGHT LOSS  . Headache   . HTN (hypertension) 01/07/2015   H/O/ GASTRIC SLEEVE /WT LOSS  . Hypothyroidism    not since weight loss  . MRSA infection 2014   h/o years ago  . PONV (postoperative nausea and vomiting) 01/29/2020  . Renal insufficiency    error in entry  . Sleep apnea    H/O PRIOR TO WEIGHT LOSS     Patient Active Problem List   Diagnosis Date Noted  . Intractable nausea and vomiting 10/04/2020  . Intractable vomiting   . Esophageal dysphagia   . Abdominal pain   . Gastric erythema   . Iron deficiency anemia   . Near syncope 10/01/2020  . PONV (postoperative nausea and vomiting) 01/29/2020  .  Calculus of gallbladder without cholecystitis without obstruction   . Shoulder pain 02/07/2015  . HTN (hypertension) 01/07/2015  . Vitamin B12 deficiency 01/03/2015  . 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 Major depressive disorder, recurrent, severe without psychotic behavior (HCC) 01/02/2015  . Hypothyroidism 01/02/2015  . Opioid use disorder, mild, abuse (HCC) 01/02/2015  . Rotator cuff injury 01/02/2015  . 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 CHOLECYSTECTOMY N/A 01/14/2018   Procedure: LAPAROSCOPIC CHOLECYSTECTOMY;  Surgeon: Ancil Linsey, MD;  Location: [facility] ORS;  Service: General;  Laterality: N/A;  . COLONOSCOPY  2015  . COLONOSCOPY N/A 10/03/2020   Procedure: COLONOSCOPY;  Surgeon: Pasty Spillers, MD;  Location: [facility] ENDOSCOPY;  Service: Endoscopy;  Laterality: N/A;  . ESOPHAGOGASTRODUODENOSCOPY  2015  . ESOPHAGOGASTRODUODENOSCOPY N/A 10/03/2020   Procedure: ESOPHAGOGASTRODUODENOSCOPY (EGD);  Surgeon: Pasty Spillers, MD;  Location: [facility] ENDOSCOPY;  Service: Endoscopy;  Laterality: N/A;  . LAPAROSCOPIC GASTRIC SLEEVE RESECTION  2013  . SHOULDER ARTHROSCOPY WITH OPEN ROTATOR CUFF REPAIR Right 02/06/2015   Procedure: SHOULDER ARTHROSCOPY WITH OPEN ROTATOR CUFF REPAIR;  Surgeon: Juanell Fairly, MD;  Location: [facility] ORS;  Service: Orthopedics;  Laterality: Right;  . XI ROBOTIC ASSISTED VENTRAL HERNIA N/A 01/29/2020   Procedure: XI ROBOTIC ASSISTED INCISIONAL HERNIA REPAIR WITH MESH;  Surgeon: Carolan Shiver, MD;  Location: [facility] ORS;  Service: General;  Laterality: N/A;     Prior to Admission medications   Medication Sig Start Date End Date Taking? Authorizing Provider  acetaminophen (TYLENOL) 500 MG tablet Take 500-1,000 mg by mouth every 6 (six) hours as needed (for pain.).    [provider]  Cholecalciferol (VITAMIN D3)  125 MCG (5000 UT) CAPS Take 1 capsule by mouth daily.    [provider]  ferrous gluconate (FERGON) 324 MG tablet Take 1 tablet (324 mg total) by mouth 2 (two) times daily with a meal. 10/04/20 11/03/20  Charise Killian, MD  ibuprofen (ADVIL) 600 MG tablet Take 1 tablet (600 mg total) by mouth every 6 (six) hours as needed. 07/17/20   Becky Augusta, NP  methocarbamol (ROBAXIN) 500 MG tablet Take 1 tablet (500 mg total) by mouth 2 (two) times daily. Patient not taking: No sig reported 07/17/20   Becky Augusta, NP  Multiple Vitamin (MULTIVITAMIN WITH MINERALS) TABS tablet Take 1 tablet by mouth daily.    [provider]  traZODone (DESYREL) 50 MG tablet Take 0.5 tablets (25 mg total) by mouth at bedtime as needed for sleep. 10/04/20 11/03/20  Charise Killian, MD  vitamin B-12 1000 MCG tablet Take 1 tablet (1,000 mcg total) by mouth daily. 10/05/20 11/04/20  Charise Killian, MD  zinc gluconate 50 MG tablet Take 50 mg by mouth daily.    [provider]     Allergies Nsaids   Family History  Problem Relation Age of Onset  . Diabetes Mother   . Hypertension Mother   . Cancer Mother   . Cancer Father     Social History Social History   Tobacco Use  . Smoking status: Never Smoker  . Smokeless tobacco: Never Used  Vaping Use  . Vaping Use: Never used  Substance Use Topics  . Alcohol use: Not Currently    Comment: states he quit drinkin  . Drug use: No    Review of Systems  Constitutional:   No fever or chills.  Positive fatigue ENT:   No sore throat. No rhinorrhea. Cardiovascular:   No chest pain or syncope. Respiratory:   No dyspnea or cough. Gastrointestinal:   Negative for abdominal pain, vomiting and diarrhea.  Musculoskeletal:   Negative for focal pain or swelling All other systems reviewed and are negative except as documented above in ROS and HPI.  ____________________________________________   PHYSICAL EXAM:  VITAL SIGNS: ED Triage  Vitals [10/16/20 1825]  Enc Vitals Group     BP (!) 145/95     Pulse Rate 78     Resp 18     Temp 98.4 F (36.9 C)     Temp Source Oral     SpO2 98 %     Weight 209 lb (94.8 kg)     Height 5\' 9"  (1.753 m)     Head Circumference      Peak Flow      Pain Score 0     Pain Loc      Pain Edu?      Excl. in GC?     Vital signs reviewed, nursing assessments reviewed.   Constitutional:   Alert and oriented. Non-toxic appearance. Eyes:   Conjunctivae are normal. EOMI. PERRL. ENT      Head:   Normocephalic and atraumatic.      Nose:   Wearing a mask.      Mouth/Throat:   Wearing a mask.  Neck:   No meningismus. Full ROM.  Thyroid nonpalpable, nontender Hematological/Lymphatic/Immunilogical:   No cervical lymphadenopathy. Cardiovascular:   RRR. Symmetric bilateral radial and DP pulses.  No murmurs. Cap refill less than 2 seconds. Respiratory:   Normal respiratory effort without tachypnea/retractions. Breath sounds are clear and equal bilaterally. No wheezes/rales/rhonchi. Gastrointestinal:   Soft and nontender. Non distended. There is no CVA tenderness.  No rebound, rigidity, or guarding. Genitourinary:   deferred Musculoskeletal:   Normal range of motion in all extremities. No joint effusions.  No lower extremity tenderness.  No edema. Neurologic:   Normal speech and language.  Motor grossly intact. No acute focal neurologic deficits are appreciated.  Skin:    Skin is warm, dry and intact. No rash noted.  No petechiae, purpura, or bullae.  ____________________________________________    LABS (pertinent positives/negatives) (all labs ordered are listed, but only abnormal results are displayed) Labs Reviewed  URINALYSIS, COMPLETE (UACMP) WITH MICROSCOPIC - Abnormal; Notable for the following components:      Result Value   Color, Urine STRAW (*)    APPearance CLEAR (*)    All other components within normal limits  OSMOLALITY, URINE - Abnormal; Notable for the following  components:   Osmolality, Ur 277 (*)    All other components within normal limits  COMPREHENSIVE METABOLIC PANEL - Abnormal; Notable for the following components:   Sodium 131 (*)    Chloride 96 (*)    BUN 22 (*)    Alkaline Phosphatase 33 (*)    Total Bilirubin 1.3 (*)    All other components within normal limits  SODIUM, URINE, RANDOM  TSH  T4, FREE  OSMOLALITY   ____________________________________________   EKG    ____________________________________________    RADIOLOGY  No results found.  ____________________________________________   PROCEDURES Procedures  ____________________________________________  DIFFERENTIAL DIAGNOSIS   Electrolyte abnormality, dehydration, hypothyroidism, nutritional deficiency  CLINICAL IMPRESSION / ASSESSMENT AND PLAN / ED COURSE  Medications ordered in the ED: Medications - No data to display  Pertinent labs & imaging results that were available during my care of the patient were reviewed by me and considered in my medical decision making (see chart for details).  Erskine EmeryMatthew J Bidwell was evaluated in Emergency Department on 10/16/2020 for the symptoms described in the history of present illness. He was evaluated in the context of the global COVID-19 pandemic, which necessitated consideration that the patient might be at risk for infection with the SARS-CoV-2 virus that causes COVID-19. Institutional protocols and algorithms that pertain to the evaluation of patients at risk for COVID-19 are in a state of rapid change based on information released by regulatory bodies including the CDC and federal and state organizations. These policies and algorithms were followed during the patient's care in the ED.   Patient presents with subacute to chronic fatigue, has outpatient follow-ups scheduled.  Vital signs are normal, exam is reassuring.  Sodium today at walk-in clinic was 129 which is stable from previous values of about 131 or 132.  I doubt  this is the cause of his symptoms.  Metabolic work-up of hyponatremia is overall reassuring and suggesting of hypovolemia despite patient's conscious efforts to increase his fluid intake.  TSH normal.  Other outstanding possibility is SIADH which is not effectively tested for with random lab draws.  Recommend he continue B12 and iron supplements.  Counseled on avoiding dairy and taking a vitamin C supplement with the iron as well.  He is taking stool softener.  Continue follow-ups, can  pursue SIADH work-up outpatient as needed.  Recommended increasing salt intake for now as well especially with hot weather.      ____________________________________________   FINAL CLINICAL IMPRESSION(S) / ED DIAGNOSES    Final diagnoses:  Hyponatremia  Fatigue, unspecified type     ED Discharge Orders    None      Portions of this note were generated with dragon dictation software. Dictation errors may occur despite best attempts at proofreading.   Sharman Cheek, MD 10/16/20 2333

## 2020-10-16 NOTE — ED Triage Notes (Signed)
Pt comes into the ED from [facility] clinic for abnormal lab. Pt was just discharged on 10/04/20 for the same thing.  Pt shows Sodium at 129 and Hgb at 10.  Pt has f/u scheduled with GI from an endoscopy but it is scheduled for next week.  Pt does admit to weakness and fatigue.  PT ambulatory to triage at this time and in NAD.  Pt denies any known rectal bleeding.

## 2020-10-16 NOTE — Discharge Instructions (Addendum)
Results for orders placed or performed during the hospital encounter of 10/16/20  Urinalysis, Complete w Microscopic Urine, Clean Catch  Result Value Ref Range   Color, Urine STRAW (A) YELLOW   APPearance CLEAR (A) CLEAR   Specific Gravity, Urine 1.008 1.005 - 1.030   pH 7.0 5.0 - 8.0   Glucose, UA NEGATIVE NEGATIVE mg/dL   Hgb urine dipstick NEGATIVE NEGATIVE   Bilirubin Urine NEGATIVE NEGATIVE   Ketones, ur NEGATIVE NEGATIVE mg/dL   Protein, ur NEGATIVE NEGATIVE mg/dL   Nitrite NEGATIVE NEGATIVE   Leukocytes,Ua NEGATIVE NEGATIVE   RBC / HPF 0-5 0 - 5 RBC/hpf   WBC, UA 0-5 0 - 5 WBC/hpf   Bacteria, UA NONE SEEN NONE SEEN   Squamous Epithelial / LPF NONE SEEN 0 - 5  Sodium, urine, random  Result Value Ref Range   Sodium, Ur 33 mmol/L  Osmolality, urine  Result Value Ref Range   Osmolality, Ur 277 (L) 300 - 900 mOsm/kg  Comprehensive metabolic panel  Result Value Ref Range   Sodium 131 (L) 135 - 145 mmol/L   Potassium 4.1 3.5 - 5.1 mmol/L   Chloride 96 (L) 98 - 111 mmol/L   CO2 24 22 - 32 mmol/L   Glucose, Bld 84 70 - 99 mg/dL   BUN 22 (H) 6 - 20 mg/dL   Creatinine, Ser 2.83 0.61 - 1.24 mg/dL   Calcium 9.2 8.9 - 15.1 mg/dL   Total Protein 6.8 6.5 - 8.1 g/dL   Albumin 4.1 3.5 - 5.0 g/dL   AST 33 15 - 41 U/L   ALT 29 0 - 44 U/L   Alkaline Phosphatase 33 (L) 38 - 126 U/L   Total Bilirubin 1.3 (H) 0.3 - 1.2 mg/dL   GFR, Estimated >76 >16 mL/min   Anion gap 11 5 - 15  TSH  Result Value Ref Range   TSH 2.871 0.350 - 4.500 uIU/mL  T4, free  Result Value Ref Range   Free T4 0.63 0.61 - 1.12 ng/dL  Osmolality  Result Value Ref Range   Osmolality 278 275 - 295 mOsm/kg   CT ABDOMEN PELVIS WO CONTRAST  Result Date: 10/01/2020 CLINICAL DATA:  Epigastric abdominal pain. Remote history of gastric sleeve procedure. EXAM: CT ABDOMEN AND PELVIS WITHOUT CONTRAST TECHNIQUE: Multidetector CT imaging of the abdomen and pelvis was performed following the standard protocol without IV  contrast. COMPARISON:  01/11/2018 FINDINGS: Lower chest: The lung bases are clear of an acute process. No pleural effusions or pulmonary lesions. The heart is normal in size. No pericardial effusion. Hepatobiliary: No hepatic lesions are identified without contrast. No intrahepatic biliary dilatation. The gallbladder is surgically absent. No common bile duct dilatation. Pancreas: No obvious mass, acute inflammation ductal dilatation. Spleen: Normal size.  No focal lesions. Adrenals/Urinary Tract: The adrenal glands and kidneys are grossly normal. No renal calculi or obstructing ureteral calculi. The bladder is grossly normal. Stomach/Bowel: The surgical changes are noted along the GE junction from a probable hiatal hernia repair. I do not see any definite findings for recurrent hernia. There are also surgical changes from a gastric sleeve procedure. No complicating features are identified. The duodenum and small bowel are unremarkable. No evidence of colonic mass or colonic obstruction. Vascular/Lymphatic: Minimal scattered aortic calcifications. No aneurysm. No mesenteric or retroperitoneal mass or adenopathy. Reproductive: The prostate gland and seminal vesicles are unremarkable. The Other: No pelvic mass or significant free pelvic fluid collections. No inguinal mass or hernia. Musculoskeletal: No significant bony findings.  IMPRESSION: 1. Surgical changes from a probable hiatal hernia repair and gastric sleeve procedure. No complicating features are identified. 2. No acute abdominal/pelvic findings, mass lesions or adenopathy. 3. Status post cholecystectomy. No biliary dilatation. Electronically Signed   By: Rudie Meyer M.D.   On: 10/01/2020 17:03   ECHOCARDIOGRAM COMPLETE  Result Date: 10/02/2020    ECHOCARDIOGRAM REPORT   Patient Name:   Trevor Torres Date of Exam: 10/02/2020 Medical Rec #:  [id number]      Height:       69.0 in Accession #:    [id number]     Weight:       209.9 lb Date of Birth:   [birth year]     BSA:          2.109 m Patient Age:    50 years       BP:           130/87 mmHg Patient Gender: M              HR:           54 bpm. Exam Location:  [facility] Procedure: 2D Echo, Color Doppler, Cardiac Doppler and Strain Analysis Indications:     Dyspnea R06.00  History:         Patient has no prior history of Echocardiogram examinations.                  Risk Factors:Sleep Apnea and Hypertension.  Sonographer:     Cristela Blue RDCS (AE) Referring Phys:  [id number] [address] Diagnosing Phys: Debbe Odea MD  Sonographer Comments: Global longitudinal strain was attempted. IMPRESSIONS  1. Left ventricular ejection fraction, by estimation, is 50 to 55%. The left ventricle has low normal function. The left ventricle has no regional wall motion abnormalities. Left ventricular diastolic parameters were normal.  2. Right ventricular systolic function is normal. The right ventricular size is normal.  3. The mitral valve is normal in structure. No evidence of mitral valve regurgitation.  4. The aortic valve is tricuspid. Aortic valve regurgitation is not visualized. FINDINGS  Left Ventricle: Left ventricular ejection fraction, by estimation, is 50 to 55%. The left ventricle has low normal function. The left ventricle has no regional wall motion abnormalities. Global longitudinal strain performed but not reported based on interpreter judgement due to suboptimal tracking. 3D left ventricular ejection fraction analysis performed but not reported based on interpreter judgement due to suboptimal quality. The left ventricular internal cavity size was normal in size. There is no left ventricular hypertrophy. Left ventricular diastolic parameters were normal. Right Ventricle: The right ventricular size is normal. No increase in right ventricular wall thickness. Right ventricular systolic function is normal. Left Atrium: Left atrial size was normal in size. Right Atrium: Right atrial size was normal in size. Pericardium:  Trivial pericardial effusion is present. Mitral Valve: The mitral valve is normal in structure. No evidence of mitral valve regurgitation. Tricuspid Valve: The tricuspid valve is normal in structure. Tricuspid valve regurgitation is mild. Aortic Valve: The aortic valve is tricuspid. Aortic valve regurgitation is not visualized. Aortic valve mean gradient measures 2.0 mmHg. Aortic valve peak gradient measures 3.8 mmHg. Aortic valve area, by VTI measures 3.43 cm. Pulmonic Valve: The pulmonic valve was not well visualized. Pulmonic valve regurgitation is not visualized. Aorta: The aortic root is normal in size and structure. Venous: The inferior vena cava was not well visualized. IAS/Shunts: No atrial level shunt detected by color flow Doppler.  LEFT VENTRICLE PLAX  2D LVIDd:         4.29 cm  Diastology LVIDs:         2.65 cm  LV e' medial:    5.77 cm/s LV PW:         1.11 cm  LV E/e' medial:  10.5 LV IVS:        0.83 cm  LV e' lateral:   13.20 cm/s LVOT diam:     2.30 cm  LV E/e' lateral: 4.6 LV SV:         72 LV SV Index:   34 LVOT Area:     4.15 cm  RIGHT VENTRICLE RV Basal diam:  4.17 cm RV S prime:     15.00 cm/s TAPSE (M-mode): 3.7 cm LEFT ATRIUM           Index       RIGHT ATRIUM           Index LA diam:      3.00 cm 1.42 cm/m  RA Area:     19.60 cm LA Vol (A2C): 37.7 ml 17.88 ml/m RA Volume:   54.40 ml  25.80 ml/m LA Vol (A4C): 21.7 ml 10.29 ml/m  AORTIC VALVE                   PULMONIC VALVE AV Area (Vmax):    3.48 cm    PV Vmax:        0.75 m/s AV Area (Vmean):   3.67 cm    PV Peak grad:   2.2 mmHg AV Area (VTI):     3.43 cm    RVOT Peak grad: 3 mmHg AV Vmax:           97.60 cm/s AV Vmean:          74.200 cm/s AV VTI:            0.211 m AV Peak Grad:      3.8 mmHg AV Mean Grad:      2.0 mmHg LVOT Vmax:         81.80 cm/s LVOT Vmean:        65.600 cm/s LVOT VTI:          0.174 m LVOT/AV VTI ratio: 0.82  AORTA Ao Root diam: 3.70 cm MITRAL VALVE               TRICUSPID VALVE MV Area (PHT): 2.49 cm     TR Peak grad:   19.4 mmHg MV Decel Time: 305 msec    TR Vmax:        220.00 cm/s MV E velocity: 60.40 cm/s MV A velocity: 51.80 cm/s  SHUNTS MV E/A ratio:  1.17        Systemic VTI:  0.17 m                            Systemic Diam: 2.30 cm Debbe Odea MD Electronically signed by Debbe Odea MD Signature Date/Time: 10/02/2020/5:09:30 PM    Final

## 2020-10-16 NOTE — ED Notes (Signed)
Pt waiting to see er md.  Pt alert.

## 2020-10-16 NOTE — ED Notes (Signed)
Pt sent to er for eval of low hgb.  Pt denies blood in stools or tarry stools.  No etoh use.  Gastric sleeve 7 years ago.  Denies vomiting.  Pt alert  Speech clear.  Pt in hallway bed.

## 2020-11-20 ENCOUNTER — Encounter: Payer: Self-pay | Admitting: Oncology

## 2020-11-20 ENCOUNTER — Inpatient Hospital Stay: Payer: Managed Care, Other (non HMO)

## 2020-11-20 ENCOUNTER — Inpatient Hospital Stay: Payer: Managed Care, Other (non HMO) | Attending: Oncology | Admitting: Oncology

## 2020-11-20 VITALS — BP 139/90 | HR 67 | Temp 97.0°F | Resp 18 | Ht 69.7 in | Wt 210.6 lb

## 2020-11-20 DIAGNOSIS — Z809 Family history of malignant neoplasm, unspecified: Secondary | ICD-10-CM

## 2020-11-20 DIAGNOSIS — Z803 Family history of malignant neoplasm of breast: Secondary | ICD-10-CM | POA: Insufficient documentation

## 2020-11-20 DIAGNOSIS — D508 Other iron deficiency anemias: Secondary | ICD-10-CM | POA: Diagnosis not present

## 2020-11-20 DIAGNOSIS — E871 Hypo-osmolality and hyponatremia: Secondary | ICD-10-CM | POA: Diagnosis not present

## 2020-11-20 DIAGNOSIS — Z801 Family history of malignant neoplasm of trachea, bronchus and lung: Secondary | ICD-10-CM

## 2020-11-20 DIAGNOSIS — Z9884 Bariatric surgery status: Secondary | ICD-10-CM | POA: Insufficient documentation

## 2020-11-20 DIAGNOSIS — E538 Deficiency of other specified B group vitamins: Secondary | ICD-10-CM

## 2020-11-20 DIAGNOSIS — Z87891 Personal history of nicotine dependence: Secondary | ICD-10-CM | POA: Insufficient documentation

## 2020-11-20 NOTE — Progress Notes (Signed)
Hematology/Oncology Consult note [facility] [phone] [phone]   Patient Care Team: System, Provider Not In as PCP - General  REFERRING PROVIDER: Gilda Crease, PA*  CHIEF COMPLAINTS/REASON FOR VISIT:  Evaluation of iron deficiency anemia  HISTORY OF PRESENTING ILLNESS:   Trevor Torres is a  50 y.o.  male with PMH listed below was seen in consultation at the request of  Wylie Hail M, [place]*  for evaluation of iron deficiency anemia  Patient has history of gastric sleeve. 10/01/2020- 10/04/2020 admitted due to near syncope, echo showed LVEF 50-55% with normal diastolic function, no wall motion abnormalities. Hyponatremia, which improved with hydration.  Low B12 level at 60, started on B12 supplementation.  Anemia, microcytic, low iron, started on oral iron supplementation.  Dysphagia, S/p EGD which showed mucosal changes in the duodenum, biopsied & biopsies taken at the gastric body, incisura & gastric antrum. S/p colonoscopy showed non-bleeding internal hemorrhoids.  all biopsies negative for malignancy, dysplasia. Negative H Pylori. 10/16/2020 ED visit for fatigue. Hb slightly improved to 10.9.  10/24/2020 seen [facility]. 11/04/2020 capsule study is negative.  10/30/2020 cbc showed hb 11.3, mcv 72, TIBC 429, iron saturation is 5, B12 is 164.  Patient was referred to establish care with Hematology for evaluation.   He also has chronic hyponatremia and he puts salt on his food.  Significant family history of cancer- see family history. He smoked socially for a few years in his 27s. Quitted remotely.    Review of Systems  Constitutional:  Positive for fatigue. Negative for appetite change, chills, fever and unexpected weight change.  HENT:   Negative for hearing loss and voice change.   Eyes:  Negative for eye problems and icterus.  Respiratory:  Negative for chest tightness, cough and shortness of breath.   Cardiovascular:   Negative for chest pain and leg swelling.  Gastrointestinal:  Negative for abdominal distention and abdominal pain.  Endocrine: Negative for hot flashes.  Genitourinary:  Negative for difficulty urinating, dysuria and frequency.   Musculoskeletal:  Negative for arthralgias.  Skin:  Negative for itching and rash.  Neurological:  Negative for light-headedness and numbness.  Hematological:  Negative for adenopathy. Does not bruise/bleed easily.  Psychiatric/Behavioral:  Negative for confusion.    MEDICAL HISTORY:  Past Medical History:  Diagnosis Date   Anemia    Anxiety    Cellulitis 01/2015   left arm   GERD (gastroesophageal reflux disease)    H/O PRIOR TO WEIGHT LOSS   Headache    HTN (hypertension) 01/07/2015   H/O/ GASTRIC SLEEVE /WT LOSS   Hypothyroidism    not since weight loss   MRSA infection 2014   h/o years ago   PONV (postoperative nausea and vomiting) 01/29/2020   Renal insufficiency    error in entry   Sleep apnea    H/O PRIOR TO WEIGHT LOSS    SURGICAL HISTORY: Past Surgical History:  Procedure Laterality Date   CHOLECYSTECTOMY N/A 01/14/2018   Procedure: LAPAROSCOPIC CHOLECYSTECTOMY;  Surgeon: Ancil Linsey, MD;  Location: [facility] ORS;  Service: General;  Laterality: N/A;   COLONOSCOPY  2015   COLONOSCOPY N/A 10/03/2020   Procedure: COLONOSCOPY;  Surgeon: Pasty Spillers, MD;  Location: [facility] ENDOSCOPY;  Service: Endoscopy;  Laterality: N/A;   ESOPHAGOGASTRODUODENOSCOPY  2015   ESOPHAGOGASTRODUODENOSCOPY N/A 10/03/2020   Procedure: ESOPHAGOGASTRODUODENOSCOPY (EGD);  Surgeon: Pasty Spillers, MD;  Location: [facility]COPY;  Service: Endoscopy;  Laterality: N/A;   LAPAROSCOPIC GASTRIC SLEEVE  RESECTION  2013   SHOULDER ARTHROSCOPY WITH OPEN ROTATOR CUFF REPAIR Right 02/06/2015   Procedure: SHOULDER ARTHROSCOPY WITH OPEN ROTATOR CUFF REPAIR;  Surgeon: Juanell Fairly, MD;  Location: [facility] ORS;  Service: Orthopedics;  Laterality: Right;   XI ROBOTIC  ASSISTED VENTRAL HERNIA N/A 01/29/2020   Procedure: XI ROBOTIC ASSISTED INCISIONAL HERNIA REPAIR WITH MESH;  Surgeon: Carolan Shiver, MD;  Location: [facility];  Service: General;  Laterality: N/A;    SOCIAL HISTORY: Social History   Socioeconomic History   Marital status: Divorced    Spouse name: Not on file   Number of children: Not on file   Years of education: Not on file   Highest education level: Not on file  Occupational History   Not on file  Tobacco Use   Smoking status: Never   Smokeless tobacco: Never  Vaping Use   Vaping Use: Never used  Substance and Sexual Activity   Alcohol use: Not Currently    Comment: states he quit drinkin   Drug use: No   Sexual activity: Not on file  Other Topics Concern   Not on file  Social History Narrative   Not on file   Social Determinants of Health   Financial Resource Strain: Not on file  Food Insecurity: Not on file  Transportation Needs: Not on file  Physical Activity: Not on file  Stress: Not on file  Social Connections: Not on file  Intimate Partner Violence: Not on file    FAMILY HISTORY: Family History  Problem Relation Age of Onset   Cancer Mother        Breast Ca   Diabetes Mother    Hypertension Mother    Cancer Father        Lung Ca   Cancer Maternal Aunt        Colon and Kidney Ca   Cancer Maternal Aunt        Breast Ca   Cancer Maternal Grandfather        Lung Ca    ALLERGIES:  is allergic to nsaids.  MEDICATIONS:  Current Outpatient Medications  Medication Sig Dispense Refill   acetaminophen (TYLENOL) 500 MG tablet Take 500-1,000 mg by mouth every 6 (six) hours as needed (for pain.).     Cholecalciferol (VITAMIN D3) 125 MCG (5000 UT) CAPS Take 1 capsule by mouth daily.     ferrous gluconate (FERGON) 324 MG tablet Take 1 tablet (324 mg total) by mouth 2 (two) times daily with a meal. 60 tablet 0   ibuprofen (ADVIL) 600 MG tablet Take 1 tablet (600 mg total) by mouth every 6 (six) hours as  needed. 30 tablet 0   Multiple Vitamin (MULTIVITAMIN WITH MINERALS) TABS tablet Take 1 tablet by mouth daily.     traZODone (DESYREL) 50 MG tablet Take 0.5 tablets (25 mg total) by mouth at bedtime as needed for sleep. 15 tablet 0   zinc gluconate 50 MG tablet Take 50 mg by mouth daily.     No current facility-administered medications for this visit.     PHYSICAL EXAMINATION: ECOG PERFORMANCE STATUS: 1 - Symptomatic but completely ambulatory Vitals:   11/20/20 1559  BP: 139/90  Pulse: 67  Resp: 18  Temp: (!) 97 F (36.1 C)  SpO2: 100%   Filed Weights   11/20/20 1559  Weight: 210 lb 9.6 oz (95.5 kg)    Physical Exam Constitutional:      General: He is not in acute distress. HENT:     Head:  Normocephalic and atraumatic.  Eyes:     General: No scleral icterus. Cardiovascular:     Rate and Rhythm: Normal rate and regular rhythm.     Heart sounds: Normal heart sounds.  Pulmonary:     Effort: Pulmonary effort is normal. No respiratory distress.     Breath sounds: No wheezing.  Abdominal:     General: Bowel sounds are normal. There is no distension.     Palpations: Abdomen is soft.  Musculoskeletal:        General: No deformity. Normal range of motion.     Cervical back: Normal range of motion and neck supple.  Skin:    General: Skin is warm and dry.     Findings: No erythema or rash.  Neurological:     Mental Status: He is alert and oriented to person, place, and time. Mental status is at baseline.     Cranial Nerves: No cranial nerve deficit.     Coordination: Coordination normal.  Psychiatric:        Mood and Affect: Mood normal.    LABORATORY DATA:  I have reviewed the data as listed Lab Results  Component Value Date   WBC 7.3 10/04/2020   HGB 10.9 (L) 10/04/2020   HCT 34.5 (L) 10/04/2020   MCV 71.4 (L) 10/04/2020   PLT 435 (H) 10/04/2020   Recent Labs    10/01/20 1225 10/02/20 0440 10/03/20 0400 10/04/20 0505 10/16/20 2117  NA 125* 130* 131* 132*  131*  K 4.4 4.2 3.9 4.2 4.1  CL 94* 99 101 102 96*  CO2 24 24 24 23 24   GLUCOSE 89 84 79 79 84  BUN 19 14 8 11  22*  CREATININE 0.99 0.87 0.83 0.98 1.07  CALCIUM 9.2 8.8* 8.4* 8.6* 9.2  GFRNONAA >60 >60 >60 >60 >60  PROT 7.0 6.7  --   --  6.8  ALBUMIN 4.3 3.8  --   --  4.1  AST 42* 34  --   --  33  ALT 37 31  --   --  29  ALKPHOS 37* 34*  --   --  33*  BILITOT 0.9 1.0  --   --  1.3*   Iron/TIBC/Ferritin/ %Sat    Component Value Date/Time   IRON 16 (L) 10/01/2020 1225   IRON 94 12/07/2012 0845   TIBC 427 10/01/2020 1225   TIBC 372 12/07/2012 0845   FERRITIN 9 (L) 10/01/2020 1225   FERRITIN 49 12/07/2012 0845   IRONPCTSAT 4 (L) 10/01/2020 1225   IRONPCTSAT 25 12/07/2012 0845      RADIOGRAPHIC STUDIES: I have personally reviewed the radiological images as listed and agreed with the findings in the report. No results found.    ASSESSMENT & PLAN:  1. Vitamin B12 deficiency   2. Other iron deficiency anemia   3. Hyponatremia   4. Family history of cancer    # Iron deficiency anemia.  Labs are reviewed and discussed with patient. Consistent with iron deficiency anemia. He has had extensive GI work up done with no obvious bleeding source found. 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  Discussed with him about IV venofer option.  Plan IV iron with Venofer 200mg  weekly x 4 doses. Allergy reactions/infusion reaction including anaphylactic reaction discussed with patient. Other side effects include but not limited to high blood pressure, skin rash, weight gain, leg swelling, etc. Patient voices understanding and willing to proceed.  Vitamin B12 deficiency.  Recommend parental B12 injections. Will start  him on weekly B12 1000 mcg x 4, followed by monthly.   Hyponatremia, etiology unknown.  Serum osmolarity is 275, urine osmolarity is decreased at 277. Urine sodium 33.  Check morning cortisol level- [facility] Rx was given.   Family history of cancer. Refer to [facility]  counselor.    All questions were answered. The patient knows to call the clinic with any problems questions or concerns.   Wylie Hailroley, [place]ville CovingtonM, [place]*    Return of visit: 8 weeks. labs cbc iron tibc ferritin, B12 level/ B12 injection - prior to virtual MD + venofer Thank you for this kind referral and the opportunity to participate in the care of this patient. A copy of today's note is routed to referring provider    Rickard PatienceZhou Daanya Lanphier, MD, PhD Hematology Oncology [facility] Pager- [phone] 11/20/2020

## 2020-11-20 NOTE — Progress Notes (Signed)
New IDA patient today. Hx gastric sleeve 7 years ago. Taking oral iron twice a day. Iron causing nausea and constipation.

## 2020-11-22 ENCOUNTER — Encounter: Payer: Self-pay | Admitting: Oncology

## 2020-11-27 ENCOUNTER — Inpatient Hospital Stay: Payer: Managed Care, Other (non HMO)

## 2020-11-27 ENCOUNTER — Other Ambulatory Visit: Payer: Self-pay

## 2020-11-27 VITALS — BP 158/98 | HR 71 | Temp 97.5°F | Resp 18

## 2020-11-27 DIAGNOSIS — Z87891 Personal history of nicotine dependence: Secondary | ICD-10-CM | POA: Diagnosis not present

## 2020-11-27 DIAGNOSIS — E871 Hypo-osmolality and hyponatremia: Secondary | ICD-10-CM | POA: Diagnosis not present

## 2020-11-27 DIAGNOSIS — Z803 Family history of malignant neoplasm of breast: Secondary | ICD-10-CM | POA: Diagnosis not present

## 2020-11-27 DIAGNOSIS — E538 Deficiency of other specified B group vitamins: Secondary | ICD-10-CM | POA: Diagnosis not present

## 2020-11-27 DIAGNOSIS — Z801 Family history of malignant neoplasm of trachea, bronchus and lung: Secondary | ICD-10-CM | POA: Diagnosis not present

## 2020-11-27 DIAGNOSIS — Z9884 Bariatric surgery status: Secondary | ICD-10-CM | POA: Diagnosis not present

## 2020-11-27 DIAGNOSIS — D508 Other iron deficiency anemias: Secondary | ICD-10-CM | POA: Diagnosis not present

## 2020-11-27 MED ORDER — CYANOCOBALAMIN 1000 MCG/ML IJ SOLN
1000.0000 ug | Freq: Once | INTRAMUSCULAR | Status: AC
  Administered 2020-11-27: 1000 ug via INTRAMUSCULAR
  Filled 2020-11-27: qty 1

## 2020-11-27 MED ORDER — SODIUM CHLORIDE 0.9 % IV SOLN: 200.0000 mg | Freq: Once | INTRAVENOUS | Status: DC

## 2020-11-27 MED ORDER — IRON SUCROSE 20 MG/ML IV SOLN
200.0000 mg | Freq: Once | INTRAVENOUS | Status: AC
  Administered 2020-11-27: 200 mg via INTRAVENOUS
  Filled 2020-11-27: qty 10

## 2020-11-27 MED ORDER — SODIUM CHLORIDE 0.9 % IV SOLN
Freq: Once | INTRAVENOUS | Status: AC
Start: 2020-11-27 — End: 2020-11-27
  Filled 2020-11-27: qty 250

## 2020-11-27 NOTE — Progress Notes (Signed)
Pt tolerated first Venofer infusion without any issues or complications. All questions answered and support given

## 2020-11-27 NOTE — Patient Instructions (Signed)
[facility] [place] [facility]  Discharge Instructions: Thank you for choosing [facility] to provide your oncology and hematology care.  If you have a lab appointment with the Cancer Center, please go directly to the Cancer Center and check in at the registration area.  Wear comfortable clothing and clothing appropriate for easy access to any Portacath or PICC line.   We strive to give you quality time with your provider. You may need to reschedule your appointment if you arrive late (15 or more minutes).  Arriving late affects you and other patients whose appointments are after yours.  Also, if you miss three or more appointments without notifying the office, you may be dismissed from the clinic at the provider's discretion.      For prescription refill requests, have your pharmacy contact our office and allow 72 hours for refills to be completed.    Today you received the following chemotherapy and/or immunotherapy agents VENOFER      To help prevent nausea and vomiting after your treatment, we encourage you to take your nausea medication as directed.  BELOW ARE SYMPTOMS THAT SHOULD BE REPORTED IMMEDIATELY: *FEVER GREATER THAN 100.4 F (38 C) OR HIGHER *CHILLS OR SWEATING *NAUSEA AND VOMITING THAT IS NOT CONTROLLED WITH YOUR NAUSEA MEDICATION *UNUSUAL SHORTNESS OF BREATH *UNUSUAL BRUISING OR BLEEDING *URINARY PROBLEMS (pain or burning when urinating, or frequent urination) *BOWEL PROBLEMS (unusual diarrhea, constipation, pain near the anus) TENDERNESS IN MOUTH AND THROAT WITH OR WITHOUT PRESENCE OF ULCERS (sore throat, sores in mouth, or a toothache) UNUSUAL RASH, SWELLING OR PAIN  UNUSUAL VAGINAL DISCHARGE OR ITCHING   Items with * indicate a potential emergency and should be followed up as soon as possible or go to the Emergency Department if any problems should occur.  Please show the CHEMOTHERAPY ALERT CARD or IMMUNOTHERAPY ALERT CARD at check-in to  the Emergency Department and triage nurse.  Should you have questions after your visit or need to cancel or reschedule your appointment, please contact CANCER CENTER [place] [facility]  [phone] and follow the prompts.  Office hours are 8:00 a.m. to 4:30 p.m. Monday - Friday. Please note that voicemails left after 4:00 p.m. may not be returned until the following business day.  We are closed weekends and major holidays. You have access to a nurse at all times for urgent questions. Please call the main number to the clinic [phone] and follow the prompts.  For any non-urgent questions, you may also contact your provider using MyChart. We now offer e-Visits for anyone 18 and older to request care online for non-urgent symptoms. For details visit [email or web address].   Also download the MyChart app! Go to the app store, search "MyChart", open the app, select [facility], and log in with your MyChart username and password.  Due to Covid, a mask is required upon entering the hospital/clinic. If you do not have a mask, one will be given to you upon arrival. For doctor visits, patients may have 1 support person aged 18 or older with them. For treatment visits, patients cannot have anyone with them due to current Covid guidelines and our immunocompromised population.   Iron Sucrose injection What is this medication? IRON SUCROSE (AHY ern SOO krohs) is an iron complex. Iron is used to make healthy red blood cells, which carry oxygen and nutrients throughout the body. This medicine is used to treat iron deficiency anemia in people with chronickidney disease. This medicine may be used for other   purposes; ask your health care provider orpharmacist if you have questions. COMMON BRAND NAME(S): Venofer What should I tell my care team before I take this medication? They need to know if you have any of these conditions: anemia not caused by low iron levels heart disease high levels of  iron in the blood kidney disease liver disease an unusual or allergic reaction to iron, other medicines, foods, dyes, or preservatives pregnant or trying to get pregnant breast-feeding How should I use this medication? This medicine is for infusion into a vein. It is given by a health careprofessional in a hospital or clinic setting. Talk to your pediatrician regarding the use of this medicine in children. While this drug may be prescribed for children as young as 2 years for selectedconditions, precautions do apply. Overdosage: If you think you have taken too much of this medicine contact apoison control center or emergency room at once. NOTE: This medicine is only for you. Do not share this medicine with others. What if I miss a dose? It is important not to miss your dose. Call your doctor or health careprofessional if you are unable to keep an appointment. What may interact with this medication? Do not take this medicine with any of the following medications: deferoxamine dimercaprol other iron products This medicine may also interact with the following medications: chloramphenicol deferasirox This list may not describe all possible interactions. Give your health care provider a list of all the medicines, herbs, non-prescription drugs, or dietary supplements you use. Also tell them if you smoke, drink alcohol, or use illegaldrugs. Some items may interact with your medicine. What should I watch for while using this medication? Visit your doctor or healthcare professional regularly. Tell your doctor or healthcare professional if your symptoms do not start to get better or if theyget worse. You may need blood work done while you are taking this medicine. You may need to follow a special diet. Talk to your doctor. Foods that contain iron include: whole grains/cereals, dried fruits, beans, or peas, leafy greenvegetables, and organ meats (liver, kidney). What side effects may I notice from  receiving this medication? Side effects that you should report to your doctor or health care professionalas soon as possible: allergic reactions like skin rash, itching or hives, swelling of the face, lips, or tongue breathing problems changes in blood pressure cough fast, irregular heartbeat feeling faint or lightheaded, falls fever or chills flushing, sweating, or hot feelings joint or muscle aches/pains seizures swelling of the ankles or feet unusually weak or tired Side effects that usually do not require medical attention (report to yourdoctor or health care professional if they continue or are bothersome): diarrhea feeling achy headache irritation at site where injected nausea, vomiting stomach upset tiredness This list may not describe all possible side effects. Call your doctor for medical advice about side effects. You may report side effects to FDA at1-800-FDA-1088. Where should I keep my medication? This drug is given in a hospital or clinic and will not be stored at home. NOTE: This sheet is a summary. It may not cover all possible information. If you have questions about this medicine, talk to your doctor, pharmacist, orhealth care provider.  2022 Elsevier/Gold Standard (2011-02-12 17:14:35)  

## 2020-12-04 ENCOUNTER — Other Ambulatory Visit: Payer: Self-pay

## 2020-12-04 ENCOUNTER — Inpatient Hospital Stay: Payer: Managed Care, Other (non HMO)

## 2020-12-04 ENCOUNTER — Encounter: Payer: Self-pay | Admitting: Oncology

## 2020-12-04 VITALS — BP 144/99 | HR 87 | Resp 18

## 2020-12-04 DIAGNOSIS — D508 Other iron deficiency anemias: Secondary | ICD-10-CM

## 2020-12-04 DIAGNOSIS — E871 Hypo-osmolality and hyponatremia: Secondary | ICD-10-CM

## 2020-12-04 DIAGNOSIS — Z809 Family history of malignant neoplasm, unspecified: Secondary | ICD-10-CM

## 2020-12-04 LAB — RETIC PANEL
Immature Retic Fract: 19.4 % — ABNORMAL HIGH (ref 2.3–15.9)
RBC.: 4.74 MIL/uL (ref 4.22–5.81)
Retic Count, Absolute: 83.4 10*3/uL (ref 19.0–186.0)
Retic Ct Pct: 1.8 % (ref 0.4–3.1)
Reticulocyte Hemoglobin: 30.1 pg (ref >27.9)

## 2020-12-04 LAB — HEMOGLOBIN AND HEMATOCRIT, BLOOD
HCT: 34.5 % — ABNORMAL LOW (ref 39.0–52.0)
Hemoglobin: 10.8 g/dL — ABNORMAL LOW (ref 13.0–17.0)

## 2020-12-04 MED ORDER — SODIUM CHLORIDE 0.9 % IV SOLN
Freq: Once | INTRAVENOUS | Status: AC
  Filled 2020-12-04: qty 250

## 2020-12-04 MED ORDER — IRON SUCROSE 20 MG/ML IV SOLN
200.0000 mg | Freq: Once | INTRAVENOUS | Status: AC
Start: 2020-12-04 — End: 2020-12-04
  Administered 2020-12-04: 200 mg via INTRAVENOUS
  Filled 2020-12-04: qty 10

## 2020-12-04 MED ORDER — SODIUM CHLORIDE 0.9 % IV SOLN: 200.0000 mg | Freq: Once | INTRAVENOUS | Status: DC

## 2020-12-04 MED ORDER — CYANOCOBALAMIN 1000 MCG/ML IJ SOLN
1000.0000 ug | Freq: Once | INTRAMUSCULAR | Status: AC
  Administered 2020-12-04: 1000 ug via INTRAMUSCULAR
  Filled 2020-12-04: qty 1

## 2020-12-04 NOTE — Patient Instructions (Signed)
CANCER CENTER [facility] REG[facility] MEDICAL ONCOLOGY  Discharge Instructions: Thank you for choosing [facility] to provide your oncology and hematology care.  If you have a lab appointment with the Cancer Center, please go directly to the Cancer Center and check in at the registration area.  Wear comfortable clothing and clothing appropriate for easy access to any Portacath or PICC line.   We strive to give you quality time with your provider. You may need to reschedule your appointment if you arrive late (15 or more minutes).  Arriving late affects you and other patients whose appointments are after yours.  Also, if you miss three or more appointments without notifying the office, you may be dismissed from the clinic at the provider's discretion.      For prescription refill requests, have your pharmacy contact our office and allow 72 hours for refills to be completed.    Today you received the following : Venofer / Vit B12 injection    To help prevent nausea and vomiting after your treatment, we encourage you to take your nausea medication as directed.  BELOW ARE SYMPTOMS THAT SHOULD BE REPORTED IMMEDIATELY: *FEVER GREATER THAN 100.4 F (38 C) OR HIGHER *CHILLS OR SWEATING *NAUSEA AND VOMITING THAT IS NOT CONTROLLED WITH YOUR NAUSEA MEDICATION *UNUSUAL SHORTNESS OF BREATH *UNUSUAL BRUISING OR BLEEDING *URINARY PROBLEMS (pain or burning when urinating, or frequent urination) *BOWEL PROBLEMS (unusual diarrhea, constipation, pain near the anus) TENDERNESS IN MOUTH AND THROAT WITH OR WITHOUT PRESENCE OF ULCERS (sore throat, sores in mouth, or a toothache) UNUSUAL RASH, SWELLING OR PAIN  UNUSUAL VAGINAL DISCHARGE OR ITCHING   Items with * indicate a potential emergency and should be followed up as soon as possible or go to the Emergency Department if any problems should occur.  Please show the CHEMOTHERAPY ALERT CARD or IMMUNOTHERAPY ALERT CARD at check-in to the Emergency  Department and triage nurse.  Should you have questions after your visit or need to cancel or reschedule your appointment, please contact CANCER CENTER [facility] MEDICAL ONCOLOG[facility]  [phone] and follow the prompts.  Office hours are 8:00 a.m. to 4:30 p.m. Monday - Friday. Please note that voicemails left after 4:00 p.m. may not be returned until the following business day.  We are closed weekends and major holidays. You have access to a nurse at all times for urgent questions. Please call the main number to the clinic [phone] and follow the prompts.  For any non-urgent questions, you may also contact your provider using MyChart. We now offer e-Visits for anyone 47 and older to request care online for non-urgent symptoms. For details visit [email or web address].   Also download the MyChart app! Go to the app store, search "MyChart", open the app, select [facility], and log in with your MyChart username and password.  Due to Covid, a mask is required upon entering the hospital/clinic. If you do not have a mask, one will be given to you upon arrival. For doctor visits, patients may have 1 support person aged 31 or older with them. For treatment visits, patients cannot have anyone with them due to current Covid guidelines and our immunocompromised population.

## 2020-12-04 NOTE — Progress Notes (Signed)
Pt here for venofer / Vit B12 injection. Pt states last 2 days worsening fatigue/ dizziness . MD aware- labs ordered and drawn .

## 2020-12-11 ENCOUNTER — Other Ambulatory Visit: Payer: Self-pay

## 2020-12-11 ENCOUNTER — Inpatient Hospital Stay: Payer: Managed Care, Other (non HMO)

## 2020-12-11 VITALS — BP 139/92

## 2020-12-11 DIAGNOSIS — D508 Other iron deficiency anemias: Secondary | ICD-10-CM | POA: Diagnosis not present

## 2020-12-11 MED ORDER — IRON SUCROSE 20 MG/ML IV SOLN
200.0000 mg | Freq: Once | INTRAVENOUS | Status: AC
  Administered 2020-12-11: 200 mg via INTRAVENOUS
  Filled 2020-12-11: qty 10

## 2020-12-11 MED ORDER — SODIUM CHLORIDE 0.9 % IV SOLN
Freq: Once | INTRAVENOUS | Status: AC
  Filled 2020-12-11: qty 250

## 2020-12-11 MED ORDER — CYANOCOBALAMIN 1000 MCG/ML IJ SOLN
1000.0000 ug | Freq: Once | INTRAMUSCULAR | Status: AC
  Administered 2020-12-11: 1000 ug via INTRAMUSCULAR
  Filled 2020-12-11: qty 1

## 2020-12-11 MED ORDER — SODIUM CHLORIDE 0.9 % IV SOLN: 200.0000 mg | Freq: Once | INTRAVENOUS | Status: DC

## 2020-12-11 NOTE — Patient Instructions (Addendum)
CANCER CENTER [facility] REGIONAL MEDICAL ONCOLOGY  Discharge Instructions: Thank you for choosing [facility] to provide your oncology and hematology care.  If you have a lab appointment with the Cancer Center, please go directly to the Cancer Center and check in at the registration area.  Wear comfortable clothing and clothing appropriate for easy access to any Portacath or PICC line.   We strive to give you quality time with your provider. You may need to reschedule your appointment if you arrive late (15 or more minutes).  Arriving late affects you and other patients whose appointments are after yours.  Also, if you miss three or more appointments without notifying the office, you may be dismissed from the clinic at the provider's discretion.      For prescription refill requests, have your pharmacy contact our office and allow 72 hours for refills to be completed.    Today you received the following : Venofer & B12 shot   To help prevent nausea and vomiting after your treatment, we encourage you to take your nausea medication as directed.  BELOW ARE SYMPTOMS THAT SHOULD BE REPORTED IMMEDIATELY: *FEVER GREATER THAN 100.4 F (38 C) OR HIGHER *CHILLS OR SWEATING *NAUSEA AND VOMITING THAT IS NOT CONTROLLED WITH YOUR NAUSEA MEDICATION *UNUSUAL SHORTNESS OF BREATH *UNUSUAL BRUISING OR BLEEDING *URINARY PROBLEMS (pain or burning when urinating, or frequent urination) *BOWEL PROBLEMS (unusual diarrhea, constipation, pain near the anus) TENDERNESS IN MOUTH AND THROAT WITH OR WITHOUT PRESENCE OF ULCERS (sore throat, sores in mouth, or a toothache) UNUSUAL RASH, SWELLING OR PAIN  UNUSUAL VAGINAL DISCHARGE OR ITCHING   Items with * indicate a potential emergency and should be followed up as soon as possible or go to the Emergency Department if any problems should occur.  Please show the CHEMOTHERAPY ALERT CARD or IMMUNOTHERAPY ALERT CARD at check-in to the Emergency Department and  triage nurse.  Should you have questions after your visit or need to cancel or reschedule your appointment, please contact CANCER CENTER [facility] REGIONAL MEDICAL ONCOLOGY  [phone] and follow the prompts.  Office hours are 8:00 a.m. to 4:30 p.m. Monday - Friday. Please note that voicemails left after 4:00 p.m. may not be returned until the following business day.  We are closed weekends and major holidays. You have access to a nurse at all times for urgent questions. Please call the main number to the clinic [phone] and follow the prompts.  For any non-urgent questions, you may also contact your provider using MyChart. We now offer e-Visits for anyone 32 and older to request care online for non-urgent symptoms. For details visit [email or web address].   Also download the MyChart app! Go to the app store, search "MyChart", open the app, select [facility], and log in with your MyChart username and password.  Due to Covid, a mask is required upon entering the hospital/clinic. If you do not have a mask, one will be given to you upon arrival. For doctor visits, patients may have 1 support person aged 73 or older with them. For treatment visits, patients cannot have anyone with them due to current Covid guidelines and our immunocompromised population.

## 2020-12-18 ENCOUNTER — Inpatient Hospital Stay: Payer: Managed Care, Other (non HMO) | Admitting: Licensed Clinical Social Worker

## 2020-12-18 ENCOUNTER — Encounter: Payer: Self-pay | Admitting: Licensed Clinical Social Worker

## 2020-12-18 ENCOUNTER — Encounter: Payer: Managed Care, Other (non HMO) | Admitting: Licensed Clinical Social Worker

## 2020-12-18 ENCOUNTER — Inpatient Hospital Stay (HOSPITAL_BASED_OUTPATIENT_CLINIC_OR_DEPARTMENT_OTHER): Payer: Managed Care, Other (non HMO) | Admitting: Licensed Clinical Social Worker

## 2020-12-18 ENCOUNTER — Inpatient Hospital Stay: Payer: Managed Care, Other (non HMO)

## 2020-12-18 ENCOUNTER — Inpatient Hospital Stay: Payer: Managed Care, Other (non HMO) | Attending: Oncology

## 2020-12-18 VITALS — BP 123/80 | HR 61 | Temp 96.0°F | Resp 18

## 2020-12-18 DIAGNOSIS — Z801 Family history of malignant neoplasm of trachea, bronchus and lung: Secondary | ICD-10-CM | POA: Insufficient documentation

## 2020-12-18 DIAGNOSIS — Z8042 Family history of malignant neoplasm of prostate: Secondary | ICD-10-CM | POA: Diagnosis not present

## 2020-12-18 DIAGNOSIS — D508 Other iron deficiency anemias: Secondary | ICD-10-CM | POA: Diagnosis not present

## 2020-12-18 DIAGNOSIS — Z8051 Family history of malignant neoplasm of kidney: Secondary | ICD-10-CM

## 2020-12-18 DIAGNOSIS — E538 Deficiency of other specified B group vitamins: Secondary | ICD-10-CM | POA: Insufficient documentation

## 2020-12-18 DIAGNOSIS — Z803 Family history of malignant neoplasm of breast: Secondary | ICD-10-CM

## 2020-12-18 DIAGNOSIS — Z800 Family history of malignant neoplasm of digestive organs: Secondary | ICD-10-CM

## 2020-12-18 MED ORDER — SODIUM CHLORIDE 0.9 % IV SOLN
Freq: Once | INTRAVENOUS | Status: AC
  Filled 2020-12-18: qty 250

## 2020-12-18 MED ORDER — IRON SUCROSE 20 MG/ML IV SOLN
200.0000 mg | Freq: Once | INTRAVENOUS | Status: AC
  Administered 2020-12-18: 200 mg via INTRAVENOUS
  Filled 2020-12-18: qty 10

## 2020-12-18 MED ORDER — SODIUM CHLORIDE 0.9 % IV SOLN: 200.0000 mg | Freq: Once | INTRAVENOUS | Status: DC

## 2020-12-18 NOTE — Patient Instructions (Signed)

## 2020-12-18 NOTE — Progress Notes (Signed)
REFERRING PROVIDER: Earlie Server, MD [address] [place] [address],  [place] [address]  PRIMARY PROVIDER:  System, Provider Not In  PRIMARY REASON FOR VISIT:  1. Family history of breast cancer   2. Family history of prostate cancer   3. Family history of colon cancer   4. Family history of kidney cancer   5. Family history of lung cancer      HISTORY OF PRESENT ILLNESS:   Trevor Torres, a 51 y.o. male, was seen for a [facility] cancer genetics consultation at the request of Dr. Tasia Catchings due to a family history of cancer.  Trevor Torres presents to clinic today to discuss the possibility of a hereditary predisposition to cancer, genetic testing, and to further clarify his future cancer risks, as well as potential cancer risks for family members.   Trevor Torres is a 50 y.o. male with no personal history of cancer.  He had a colonoscopy and upper endoscopy in May of this year, no polyps.  CANCER HISTORY:  Oncology History   No history exists.     Past Medical History:  Diagnosis Date   Anemia    Anxiety    Cellulitis 01/2015   left arm   Family history of breast cancer    Family history of breast cancer    Family history of colon cancer    Family history of kidney cancer    Family history of lung cancer    Family history of prostate cancer    GERD (gastroesophageal reflux disease)    H/O PRIOR TO WEIGHT LOSS   Headache    HTN (hypertension) 01/07/2015   H/O/ GASTRIC SLEEVE /WT LOSS   Hypothyroidism    not since weight loss   MRSA infection 2014   h/o years ago   PONV (postoperative nausea and vomiting) 01/29/2020   Renal insufficiency    error in entry   Sleep apnea    H/O PRIOR TO WEIGHT LOSS    Past Surgical History:  Procedure Laterality Date   CHOLECYSTECTOMY N/A 01/14/2018   Procedure: LAPAROSCOPIC CHOLECYSTECTOMY;  Surgeon: Vickie Epley, MD;  Location: [facility] ORS;  Service: General;  Laterality: N/A;   COLONOSCOPY  2015   COLONOSCOPY N/A 10/03/2020   Procedure: COLONOSCOPY;   Surgeon: Virgel Manifold, MD;  Location: [facility] ENDOSCOPY;  Service: Endoscopy;  Laterality: N/A;   ESOPHAGOGASTRODUODENOSCOPY  2015   ESOPHAGOGASTRODUODENOSCOPY N/A 10/03/2020   Procedure: ESOPHAGOGASTRODUODENOSCOPY (EGD);  Surgeon: Virgel Manifold, MD;  Location: [facility] ENDOSCOPY;  Service: Endoscopy;  Laterality: N/A;   LAPAROSCOPIC GASTRIC SLEEVE RESECTION  2013   SHOULDER ARTHROSCOPY WITH OPEN ROTATOR CUFF REPAIR Right 02/06/2015   Procedure: SHOULDER ARTHROSCOPY WITH OPEN ROTATOR CUFF REPAIR;  Surgeon: Thornton Park, MD;  Location: [facility] ORS;  Service: Orthopedics;  Laterality: Right;   XI ROBOTIC ASSISTED VENTRAL HERNIA N/A 01/29/2020   Procedure: XI ROBOTIC ASSISTED INCISIONAL HERNIA REPAIR WITH MESH;  Surgeon: Herbert Pun, MD;  Location: [facility] ORS;  Service: General;  Laterality: N/A;    Social History   Socioeconomic History   Marital status: Divorced    Spouse name: Not on file   Number of children: Not on file   Years of education: Not on file   Highest education level: Not on file  Occupational History   Not on file  Tobacco Use   Smoking status: Never   Smokeless tobacco: Never  Vaping Use   Vaping Use: Never used  Substance and Sexual Activity   Alcohol use: Not Currently  Comment: states he quit drinkin   Drug use: No   Sexual activity: Not on file  Other Topics Concern   Not on file  Social History Narrative   Not on file   Social Determinants of Health   Financial Resource Strain: Not on file  Food Insecurity: Not on file  Transportation Needs: Not on file  Physical Activity: Not on file  Stress: Not on file  Social Connections: Not on file     FAMILY HISTORY:  We obtained a detailed, 4-generation family history.  Significant diagnoses are listed below: Family History  Problem Relation Age of Onset   Cancer Mother        Breast Ca   Diabetes Mother    Hypertension Mother    Cancer Father        Lung Ca   Cancer Maternal Aunt         Colon and Kidney Ca   Cancer Maternal Aunt        Breast Ca   Cancer Maternal Grandfather        Lung Ca   Trevor Torres has 1 son and 1 brother, neither have had cancer.  Trevor Torres mother had breast cancer in her 52s and died in her 95s. She had a twin sister who also had breast cancer and is living in her 53s. Patient had 2 other maternal aunts. One had colon cancer several times, kidney cancer, and another unknown type of cancer. Unknown ages of diagnosis, but patient believes she had these cancers over the age of 52. No known maternal cousins with cancer. Maternal grandfather had lung and prostate cancer and died in his 44s, grandmother died in her 52s.  Trevor Torres father had lung cancer and history of smoking/tobacco use. He passed in his 70s. Patient had 1 paternal aunt, she had cancer but patient is not sure the type, he believes she had it at a young age but passed over 75. Paternal grandfather possibly had prostate cancer. Grandmother passed at a young age of an aneurysm.  Trevor Torres is unaware of previous family history of genetic testing for hereditary cancer risks. Patient's maternal ancestors are of unknown descent, and paternal ancestors are of unknown descent. There is no reported Ashkenazi Jewish ancestry. There is no known consanguinity.    GENETIC COUNSELING ASSESSMENT: Trevor Torres is a 50 y.o. male with a family history which is somewhat suggestive of a hereditary cancer syndrome and predisposition to cancer. We, therefore, discussed and recommended the following at today's visit.   DISCUSSION: We discussed that approximately 5-10% of cancer is hereditary. Most cases of hereditary breast/prostate cancer are associated with BRCA1/BRCA2 genes, although there are other genes associated with hereditary cancer as well. There are genes associated with other cancers we see in his family such as kidney and colon. Cancers and risks are gene specific.  We discussed that testing is beneficial  for several reasons including knowing about other cancer risks, identifying potential screening and risk-reduction options that may be appropriate, and to understand if other family members could be at risk for cancer and allow them to undergo genetic testing.   We reviewed the characteristics, features and inheritance patterns of hereditary cancer syndromes. We also discussed genetic testing, including the appropriate family members to test, the process of testing, insurance coverage and turn-around-time for results. We discussed the implications of a negative, positive and/or variant of uncertain significant result. We recommended Trevor Torres pursue genetic testing for the [employer] CancerNext-Expanded+RNA gene panel.  The CancerNext-Expanded + RNAinsight gene panel offered by [employer] and includes sequencing and rearrangement analysis for the following 77 genes: IP, ALK, APC*, ATM*, AXIN2, BAP1, BARD1, BLM, BMPR1A, BRCA1*, BRCA2*, BRIP1*, CDC73, CDH1*,CDK4, CDKN1B, CDKN2A, CHEK2*, CTNNA1, DICER1, FANCC, FH, FLCN, GALNT12, KIF1B, LZTR1, MAX, MEN1, MET, MLH1*, MSH2*, MSH3, MSH6*, MUTYH*, NBN, NF1*, NF2, NTHL1, PALB2*, PHOX2B, PMS2*, POT1, PRKAR1A, PTCH1, PTEN*, RAD51C*, RAD51D*,RB1, RECQL, RET, SDHA, SDHAF2, SDHB, SDHC, SDHD, SMAD4, SMARCA4, SMARCB1, SMARCE1, STK11, SUFU, TMEM127, TP53*,TSC1, TSC2, VHL and XRCC2 (sequencing and deletion/duplication); EGFR, EGLN1, HOXB13, KIT, MITF, PDGFRA, POLD1 and POLE (sequencing only); EPCAM and GREM1 (deletion/duplication only).  Based on Trevor Torres's family history of cancer, he meets medical criteria for genetic testing. Despite that he meets criteria, he may still have an out of pocket cost. We discussed that if his out of pocket cost for testing is over $100, the laboratory will call and confirm whether he wants to proceed with testing.  If the out of pocket cost of testing is less than $100 he will be billed by the genetic testing laboratory.   PLAN: After  considering the risks, benefits, and limitations, Trevor Torres provided informed consent to pursue genetic testing and the blood sample was sent to [facility] for analysis of the CancerNext-Expanded+RNA panel. Results should be available within approximately 2-3 weeks' time, at which point they will be disclosed by telephone to Trevor Torres, as will any additional recommendations warranted by these results. Trevor Torres will receive a summary of his genetic counseling visit and a copy of his results once available. This information will also be available in Epic.   Trevor Torres questions were answered to his satisfaction today. Our contact information was provided should additional questions or concerns arise. Thank you for the referral and allowing [place] to share in the care of your patient.   Faith Rogue, MS, [facility] Genetic Counselor [email or web address] Phone: [phone]  The patient was seen for a total of 25 minutes in face-to-face genetic counseling.  Patient was seen alone.  Dr. Grayland Ormond was available for discussion regarding this case.   _______________________________________________________________________ For Office Staff:  Number of people involved in session: 1 Was an Intern/ student involved with case: no

## 2020-12-19 ENCOUNTER — Telehealth: Payer: Self-pay

## 2020-12-19 NOTE — Telephone Encounter (Signed)
Per Trula Ore woods: CT Chest request denied. Please cancel. Not enough info to support request. Xray/US needed to support further imaging is needed. 7/6 note and 5/17 CT were uploaded with this request.   NO peer to peer will be done, per MD

## 2020-12-20 ENCOUNTER — Ambulatory Visit: Payer: Managed Care, Other (non HMO)

## 2020-12-26 ENCOUNTER — Telehealth: Payer: Self-pay

## 2020-12-26 DIAGNOSIS — E871 Hypo-osmolality and hyponatremia: Secondary | ICD-10-CM

## 2020-12-26 NOTE — Telephone Encounter (Signed)
Unable to reach pt by phone. Mychart message sent to pt letting him know to get xray.

## 2020-12-26 NOTE — Telephone Encounter (Signed)
-----   Message from Rickard Patience, MD sent at 12/25/2020  9:35 PM EDT ----- Regarding: RE: Ph Call/Gas Card Please obtain cxr thanks. Reason is hyponatremia ----- Message ----- From: Coralee Rud, RN Sent: 12/25/2020   9:18 AM EDT To: Garth Schlatter, # Subject: RE: Ph Call/Gas Card                           Dr. Cathie Hoops is there something else that needs to be done/ scheduled for this pt, since CT was not approved?  ----- Message ----- From: Courtney Paris Sent: 12/23/2020   8:40 AM EDT To: Loney Loh, RN, # Subject: Ph Call/Gas Card                               GM. Mr. Winthrop returned my call and I explained why his CT was canceled/denied. He would like someone to give him a call and see what the plan moving forward will be prior to his next MD appointment. Denial letter states x-ray/US needed prior to CT. I apologized for his inconvenience and let him know he should be receiving a gas card in the mail.  TY ----- Message ----- From: Courtney Paris Sent: 12/20/2020  12:01 PM EDT To: Loney Loh, RN  Due to insurance denial of CT, appointment was to be canceled. Patient states he was unaware and showed up for his appointment. I tried to contact him and give him an update as to why CT was denied, call went directly to VM. Message was left for him to contact me.  TY

## 2020-12-28 ENCOUNTER — Ambulatory Visit
Admission: RE | Admit: 2020-12-28 | Discharge: 2020-12-28 | Disposition: A | Discharge status: Home / Self Care | Payer: Managed Care, Other (non HMO) | Attending: Oncology | Admitting: Oncology

## 2020-12-31 ENCOUNTER — Ambulatory Visit
Admission: RE | Admit: 2020-12-31 | Discharge: 2020-12-31 | Disposition: A | Discharge status: Home / Self Care | Payer: Managed Care, Other (non HMO) | Attending: Oncology | Admitting: Oncology

## 2020-12-31 ENCOUNTER — Encounter: Payer: Self-pay | Admitting: Licensed Clinical Social Worker

## 2020-12-31 ENCOUNTER — Ambulatory Visit: Payer: Self-pay | Admitting: Licensed Clinical Social Worker

## 2020-12-31 ENCOUNTER — Ambulatory Visit
Admission: RE | Admit: 2020-12-31 | Discharge: 2020-12-31 | Disposition: A | Discharge status: Home / Self Care | Payer: Managed Care, Other (non HMO) | Source: Ambulatory Visit | Attending: Oncology | Admitting: Oncology

## 2020-12-31 ENCOUNTER — Telehealth: Payer: Self-pay | Admitting: Licensed Clinical Social Worker

## 2020-12-31 DIAGNOSIS — E871 Hypo-osmolality and hyponatremia: Secondary | ICD-10-CM | POA: Diagnosis not present

## 2020-12-31 DIAGNOSIS — Z801 Family history of malignant neoplasm of trachea, bronchus and lung: Secondary | ICD-10-CM

## 2020-12-31 DIAGNOSIS — Z1379 Encounter for other screening for genetic and chromosomal anomalies: Secondary | ICD-10-CM | POA: Insufficient documentation

## 2020-12-31 DIAGNOSIS — Z8051 Family history of malignant neoplasm of kidney: Secondary | ICD-10-CM

## 2020-12-31 DIAGNOSIS — Z803 Family history of malignant neoplasm of breast: Secondary | ICD-10-CM

## 2020-12-31 DIAGNOSIS — Z8042 Family history of malignant neoplasm of prostate: Secondary | ICD-10-CM

## 2020-12-31 DIAGNOSIS — Z800 Family history of malignant neoplasm of digestive organs: Secondary | ICD-10-CM

## 2020-12-31 NOTE — Progress Notes (Signed)
HPI:  Trevor Torres was previously seen in the [facility] clinic due to a family history of cancer and concerns regarding a hereditary predisposition to cancer. Please refer to our prior cancer genetics clinic note for more information regarding our discussion, assessment and recommendations, at the time. Trevor Torres recent genetic test results were disclosed to him, as were recommendations warranted by these results. These results and recommendations are discussed in more detail below.  CANCER HISTORY:  Oncology History   No history exists.    FAMILY HISTORY:  We obtained a detailed, 4-generation family history.  Significant diagnoses are listed below: Family History  Problem Relation Age of Onset   Cancer Mother        Breast Ca   Diabetes Mother    Hypertension Mother    Cancer Father        Lung Ca   Cancer Maternal Aunt        Colon and Kidney Ca   Cancer Maternal Aunt        Breast Ca   Cancer Maternal Grandfather        Lung Ca   Trevor Torres has 1 son and 1 brother, neither have had cancer.   Trevor Torres mother had breast cancer in her 26s and died in her 28s. She had a twin sister who also had breast cancer and is living in her 78s. Patient had 2 other maternal aunts. One had colon cancer several times, kidney cancer, and another unknown type of cancer. Unknown ages of diagnosis, but patient believes she had these cancers over the age of 35. No known maternal cousins with cancer. Maternal grandfather had lung and prostate cancer and died in his 74s, grandmother died in her 74s.   Trevor Torres father had lung cancer and history of smoking/tobacco use. He passed in his 71s. Patient had 1 paternal aunt, she had cancer but patient is not sure the type, he believes she had it at a young age but passed over 47. Paternal grandfather possibly had prostate cancer. Grandmother passed at a young age of an aneurysm.   Trevor Torres is unaware of previous family history of genetic testing  for hereditary cancer risks. Patient's maternal ancestors are of unknown descent, and paternal ancestors are of unknown descent. There is no reported Ashkenazi Jewish ancestry. There is no known consanguinity.      GENETIC TEST RESULTS: Genetic testing reported out on 12/31/2020 through the [employer] CancerNext-Expanded+RNA cancer panel found no pathogenic mutations.   The CancerNext-Expanded + RNAinsight gene panel offered by [employer] and includes sequencing and rearrangement analysis for the following 77 genes: IP, ALK, APC*, ATM*, AXIN2, BAP1, BARD1, BLM, BMPR1A, BRCA1*, BRCA2*, BRIP1*, CDC73, CDH1*,CDK4, CDKN1B, CDKN2A, CHEK2*, CTNNA1, DICER1, FANCC, FH, FLCN, GALNT12, KIF1B, LZTR1, MAX, MEN1, MET, MLH1*, MSH2*, MSH3, MSH6*, MUTYH*, NBN, NF1*, NF2, NTHL1, PALB2*, PHOX2B, PMS2*, POT1, PRKAR1A, PTCH1, PTEN*, RAD51C*, RAD51D*,RB1, RECQL, RET, SDHA, SDHAF2, SDHB, SDHC, SDHD, SMAD4, SMARCA4, SMARCB1, SMARCE1, STK11, SUFU, TMEM127, TP53*,TSC1, TSC2, VHL and XRCC2 (sequencing and deletion/duplication); EGFR, EGLN1, HOXB13, KIT, MITF, PDGFRA, POLD1 and POLE (sequencing only); EPCAM and GREM1 (deletion/duplication only).   The test report has been scanned into EPIC and is located under the Molecular Pathology section of the Results Review tab.  A portion of the result report is included below for reference.     We discussed that because current genetic testing is not perfect, it is possible there may be a gene mutation in one of these genes that  current testing cannot detect, but that chance is small.  There could be another gene that has not yet been discovered, or that we have not yet tested, that is responsible for the cancer diagnoses in the family. It is also possible there is a hereditary cause for the cancer in the family that Trevor Torres and therefore was not identified in his testing.  Therefore, it is important to remain in touch with cancer genetics in the future so that we can  continue to offer Trevor Torres the most up to date genetic testing.   ADDITIONAL GENETIC TESTING: We discussed with Trevor Torres that his genetic testing was fairly extensive.  If there are genes identified to increase cancer risk that can be analyzed in the future, we would be happy to discuss and coordinate this testing at that time.    CANCER SCREENING RECOMMENDATIONS: Trevor Torres test result is considered negative (normal).  This means that we have not identified a hereditary cause for his family history of cancer at this time.   While reassuring, this does not definitively rule out a hereditary predisposition to cancer. It is still possible that there could be genetic mutations that are undetectable by current technology. There could be genetic mutations in genes that have not been tested or identified to increase cancer risk.  Therefore, it is recommended he continue to follow the cancer management and screening guidelines provided by his primary healthcare provider.   An individual's cancer risk and medical management are not determined by genetic test results alone. Overall cancer risk assessment incorporates additional factors, including personal medical history, family history, and any available genetic information that may result in a personalized plan for cancer prevention and surveillance.  RECOMMENDATIONS FOR FAMILY MEMBERS:  Relatives in this family might be at some increased risk of developing cancer, over the general population risk, simply due to the family history of cancer.  We recommended male relatives in this family have a yearly mammogram beginning at age 83, or 10 years younger than the earliest onset of cancer, an annual clinical breast exam, and perform monthly breast self-exams. Male relatives in this family should also have a gynecological exam as recommended by their primary provider.  All family members should be referred for colonoscopy starting at age 102.    It is also  possible there is a hereditary cause for the cancer in Trevor Torres family that he did not Torres and therefore was not identified in him.  Based on Trevor Torres family history, we recommended maternal relatives have genetic counseling and testing. Trevor Torres will let us know if we can be of any assistance in coordinating genetic counseling and/or testing for these family members.  FOLLOW-UP: Lastly, we discussed with Trevor Torres that cancer genetics is a rapidly advancing field and it is possible that new genetic tests will be appropriate for him and/or his family members in the future. We encouraged him to remain in contact with cancer genetics on an annual basis so we can update his personal and family histories and let him know of advances in cancer genetics that may benefit this family.   Our contact number was provided. Trevor Torres questions were answered to his satisfaction, and he knows he is welcome to call us at anytime with additional questions or concerns.   Faith Rogue, MS, [facility] Genetic Counselor [email or web address] Phone: [phone]

## 2020-12-31 NOTE — Telephone Encounter (Signed)
Revealed negative genetic testing.  This normal result is reassuring.  It is unlikely that there is an increased risk of  cancer due to a mutation in one of these genes.  However, genetic testing is not perfect, and cannot definitively rule out a hereditary cause.  It will be important for him to keep in contact with genetics to learn if any additional testing may be needed in the future.      

## 2021-01-15 ENCOUNTER — Inpatient Hospital Stay: Payer: Managed Care, Other (non HMO)

## 2021-01-15 ENCOUNTER — Other Ambulatory Visit: Payer: Self-pay

## 2021-01-15 DIAGNOSIS — E538 Deficiency of other specified B group vitamins: Secondary | ICD-10-CM

## 2021-01-15 DIAGNOSIS — Z809 Family history of malignant neoplasm, unspecified: Secondary | ICD-10-CM

## 2021-01-15 DIAGNOSIS — D508 Other iron deficiency anemias: Secondary | ICD-10-CM

## 2021-01-15 DIAGNOSIS — E871 Hypo-osmolality and hyponatremia: Secondary | ICD-10-CM

## 2021-01-15 LAB — CBC WITH DIFFERENTIAL/PLATELET
Abs Immature Granulocytes: 0.02 10*3/uL (ref 0.00–0.07)
Basophils Absolute: 0.1 10*3/uL (ref 0.0–0.1)
Basophils Relative: 1 %
Eosinophils Absolute: 0.5 10*3/uL (ref 0.0–0.5)
Eosinophils Relative: 8 %
HCT: 40.8 % (ref 39.0–52.0)
Hemoglobin: 13.2 g/dL (ref 13.0–17.0)
Immature Granulocytes: 0 %
Lymphocytes Relative: 18 %
Lymphs Abs: 1 10*3/uL (ref 0.7–4.0)
MCH: 25.7 pg — ABNORMAL LOW (ref 26.0–34.0)
MCHC: 32.4 g/dL (ref 30.0–36.0)
MCV: 79.5 fL — ABNORMAL LOW (ref 80.0–100.0)
Monocytes Absolute: 0.6 10*3/uL (ref 0.1–1.0)
Monocytes Relative: 10 %
Neutro Abs: 3.4 10*3/uL (ref 1.7–7.7)
Neutrophils Relative %: 63 %
Platelets: 290 10*3/uL (ref 150–400)
RBC: 5.13 MIL/uL (ref 4.22–5.81)
RDW: 23.7 % — ABNORMAL HIGH (ref 11.5–15.5)
WBC: 5.5 10*3/uL (ref 4.0–10.5)
nRBC: 0 % (ref 0.0–0.2)

## 2021-01-15 LAB — IRON AND TIBC
Iron: 30 ug/dL — ABNORMAL LOW (ref 45–182)
Saturation Ratios: 8 % — ABNORMAL LOW (ref 17.9–39.5)
TIBC: 367 ug/dL (ref 250–450)
UIBC: 337 ug/dL

## 2021-01-15 LAB — FERRITIN: Ferritin: 13 ng/mL — ABNORMAL LOW (ref 24–336)

## 2021-01-15 LAB — VITAMIN B12: Vitamin B-12: 59 pg/mL — ABNORMAL LOW (ref 180–914)

## 2021-01-15 MED ORDER — CYANOCOBALAMIN 1000 MCG/ML IJ SOLN
1000.0000 ug | Freq: Once | INTRAMUSCULAR | Status: AC
  Administered 2021-01-15: 1000 ug via INTRAMUSCULAR
  Filled 2021-01-15: qty 1

## 2021-01-17 ENCOUNTER — Inpatient Hospital Stay: Payer: Managed Care, Other (non HMO) | Attending: Oncology | Admitting: Oncology

## 2021-01-17 ENCOUNTER — Telehealth: Payer: Self-pay | Admitting: *Deleted

## 2021-01-17 ENCOUNTER — Encounter: Payer: Self-pay | Admitting: Oncology

## 2021-01-17 DIAGNOSIS — E871 Hypo-osmolality and hyponatremia: Secondary | ICD-10-CM

## 2021-01-17 DIAGNOSIS — E538 Deficiency of other specified B group vitamins: Secondary | ICD-10-CM | POA: Diagnosis not present

## 2021-01-17 DIAGNOSIS — D508 Other iron deficiency anemias: Secondary | ICD-10-CM

## 2021-01-17 DIAGNOSIS — D509 Iron deficiency anemia, unspecified: Secondary | ICD-10-CM | POA: Insufficient documentation

## 2021-01-17 DIAGNOSIS — Z809 Family history of malignant neoplasm, unspecified: Secondary | ICD-10-CM

## 2021-01-17 MED ORDER — VITAMIN B-12 1000 MCG SL SUBL: 1000.0000 ug | SUBLINGUAL_TABLET | Freq: Every day | SUBLINGUAL | 2 refills | Status: DC

## 2021-01-17 NOTE — Progress Notes (Signed)
HEMATOLOGY-ONCOLOGY TeleHEALTH VISIT PROGRESS NOTE  I connected with Trevor EmeryMatthew J Zerkle on 01/17/21  at 12:15 PM EDT by video enabled telemedicine visit and verified that I am speaking with the correct person using two identifiers. I discussed the limitations, risks, security and privacy concerns of performing an evaluation and management service by telemedicine and the availability of in-person appointments. The patient expressed understanding and agreed to proceed.   Other persons participating in the visit and their role in the encounter:  None  Patient's location: Home  Provider's location: office Chief Complaint: IDA and B12 deficiency   INTERVAL HISTORY Trevor Torres is a 50 y.o. male who has above history reviewed by me today presents for follow up visit for IDA and vitamin B12 deficiency.   Problems and complaints are listed below:  I attempted to connect the patient for visual enabled telehealth visit.  Due to the technical difficulties with video,  Patient was transitioned to audio only visit. Patient has had IV Venofer treatments, he tolerates well. Energy level slightly improves.  He liberates salt intake.  June 2022, he has had capsule endoscopy and result was normal.   Review of Systems  Constitutional:  Negative for appetite change, chills, fatigue and fever.  HENT:   Negative for hearing loss and voice change.   Eyes:  Negative for eye problems.  Respiratory:  Negative for chest tightness and cough.   Cardiovascular:  Negative for chest pain.  Gastrointestinal:  Negative for abdominal distention, abdominal pain and blood in stool.  Endocrine: Negative for hot flashes.  Genitourinary:  Negative for difficulty urinating and frequency.   Musculoskeletal:  Negative for arthralgias.  Skin:  Negative for itching and rash.  Neurological:  Negative for extremity weakness.  Hematological:  Negative for adenopathy.  Psychiatric/Behavioral:  Negative for confusion.    Past  Medical History:  Diagnosis Date   Anemia    Anxiety    Cellulitis 01/2015   left arm   Family history of breast cancer    Family history of breast cancer    Family history of colon cancer    Family history of kidney cancer    Family history of lung cancer    Family history of prostate cancer    GERD (gastroesophageal reflux disease)    H/O PRIOR TO WEIGHT LOSS   Headache    HTN (hypertension) 01/07/2015   H/O/ GASTRIC SLEEVE /WT LOSS   Hypothyroidism    not since weight loss   MRSA infection 2014   h/o years ago   PONV (postoperative nausea and vomiting) 01/29/2020   Renal insufficiency    error in entry   Sleep apnea    H/O PRIOR TO WEIGHT LOSS   Past Surgical History:  Procedure Laterality Date   CHOLECYSTECTOMY N/A 01/14/2018   Procedure: LAPAROSCOPIC CHOLECYSTECTOMY;  Surgeon: Ancil Linseyavis, Jason Evan, MD;  Location: [facility] ORS;  Service: General;  Laterality: N/A;   COLONOSCOPY  2015   COLONOSCOPY N/A 10/03/2020   Procedure: COLONOSCOPY;  Surgeon: Pasty Spillersahiliani, Varnita B, MD;  Location: [facility];  Service: Endoscopy;  Laterality: N/A;   ESOPHAGOGASTRODUODENOSCOPY  2015   ESOPHAGOGASTRODUODENOSCOPY N/A 10/03/2020   Procedure: ESOPHAGOGASTRODUODENOSCOPY (EGD);  Surgeon: Pasty Spillersahiliani, Varnita B, MD;  [facility];  Service: Endoscopy;  Laterality: N/A;   LAPAROSCOPIC GASTRIC SLEEVE RESECTION  2013   SHOULDER ARTHROSCOPY WITH OPEN ROTATOR CUFF REPAIR Right 02/06/2015   Procedure: SHOULDER ARTHROSCOPY WITH OPEN ROTATOR CUFF REPAIR;  Surgeon: Juanell FairlyKevin Krasinski, MD;  Location: [facility] ORS;  Service:  Orthopedics;  Laterality: Right;   XI ROBOTIC ASSISTED VENTRAL HERNIA N/A 01/29/2020   Procedure: XI ROBOTIC ASSISTED INCISIONAL HERNIA REPAIR WITH MESH;  Surgeon: Carolan Shiver, MD;  Location: [facility] ORS;  Service: General;  Laterality: N/A;    Family History  Problem Relation Age of Onset   Cancer Mother        Breast Ca   Diabetes Mother    Hypertension Mother    Cancer  Father        Lung Ca   Cancer Maternal Aunt        Colon and Kidney Ca   Cancer Maternal Aunt        Breast Ca   Cancer Maternal Grandfather        Lung Ca    Social History   Socioeconomic History   Marital status: Divorced    Spouse name: Not on file   Number of children: Not on file   Years of education: Not on file   Highest education level: Not on file  Occupational History   Not on file  Tobacco Use   Smoking status: Never   Smokeless tobacco: Never  Vaping Use   Vaping Use: Never used  Substance and Sexual Activity   Alcohol use: Not Currently    Comment: states he quit drinkin   Drug use: No   Sexual activity: Not on file  Other Topics Concern   Not on file  Social History Narrative   Not on file   Social Determinants of Health   Financial Resource Strain: Not on file  Food Insecurity: Not on file  Transportation Needs: Not on file  Physical Activity: Not on file  Stress: Not on file  Social Connections: Not on file  Intimate Partner Violence: Not on file    Current Outpatient Medications on File Prior to Visit  Medication Sig Dispense Refill   acetaminophen (TYLENOL) 500 MG tablet Take 500-1,000 mg by mouth every 6 (six) hours as needed (for pain.).     Cholecalciferol (VITAMIN D3) 125 MCG (5000 UT) CAPS Take 1 capsule by mouth daily.     ferrous gluconate (FERGON) 324 MG tablet Take 1 tablet (324 mg total) by mouth 2 (two) times daily with a meal. 60 tablet 0   Multiple Vitamin (MULTIVITAMIN WITH MINERALS) TABS tablet Take 1 tablet by mouth daily.     traZODone (DESYREL) 50 MG tablet Take 0.5 tablets (25 mg total) by mouth at bedtime as needed for sleep. 15 tablet 0   HYDROcodone-acetaminophen (NORCO/VICODIN) 5-325 MG tablet Take by mouth. (Patient not taking: Reported on 01/17/2021)     ibuprofen (ADVIL) 600 MG tablet Take 1 tablet (600 mg total) by mouth every 6 (six) hours as needed. (Patient not taking: Reported on 01/17/2021) 30 tablet 0   meloxicam  (MOBIC) 15 MG tablet Take 15 mg by mouth daily. (Patient not taking: Reported on 01/17/2021)     metaxalone (SKELAXIN) 800 MG tablet Take by mouth. (Patient not taking: Reported on 01/17/2021)     predniSONE (DELTASONE) 20 MG tablet Take 20 mg by mouth 2 (two) times daily. (Patient not taking: Reported on 01/17/2021)     traMADol (ULTRAM) 50 MG tablet tramadol 50 mg tablet  TAKE 1 TABLET BY MOUTH EVERY 6 TO 8 HOURS FOR 5 DAYS AS NEEDED (Patient not taking: Reported on 01/17/2021)     zinc gluconate 50 MG tablet Take 50 mg by mouth daily. (Patient not taking: Reported on 01/17/2021)     No  current facility-administered medications on file prior to visit.    Allergies  Allergen Reactions   Nsaids Other (See Comments)    Pt is unable to take due to gastric sleeve surgery.       Observations/Objective: There were no vitals filed for this visit. There is no height or weight on file to calculate BMI.  Physical Exam  CBC    Component Value Date/Time   WBC 5.5 01/15/2021 0800   RBC 5.13 01/15/2021 0800   HGB 13.2 01/15/2021 0800   HGB 15.1 11/29/2013 1233   HCT 40.8 01/15/2021 0800   HCT 44.2 11/29/2013 1233   PLT 290 01/15/2021 0800   PLT 324 11/29/2013 1233   MCV 79.5 (L) 01/15/2021 0800   MCV 92 11/29/2013 1233   MCH 25.7 (L) 01/15/2021 0800   MCHC 32.4 01/15/2021 0800   RDW 23.7 (H) 01/15/2021 0800   RDW 13.8 11/29/2013 1233   LYMPHSABS 1.0 01/15/2021 0800   LYMPHSABS 2.0 11/29/2013 1233   MONOABS 0.6 01/15/2021 0800   MONOABS 0.5 11/29/2013 1233   EOSABS 0.5 01/15/2021 0800   EOSABS 0.6 11/29/2013 1233   BASOSABS 0.1 01/15/2021 0800   BASOSABS 0.1 11/29/2013 1233    CMP     Component Value Date/Time   NA 131 (L) 10/16/2020 2117   NA 137 11/29/2013 1233   K 4.1 10/16/2020 2117   K 4.7 11/29/2013 1233   CL 96 (L) 10/16/2020 2117   CL 105 11/29/2013 1233   CO2 24 10/16/2020 2117   CO2 27 11/29/2013 1233   GLUCOSE 84 10/16/2020 2117   GLUCOSE 99 11/29/2013 1233   BUN 22 (H)  10/16/2020 2117   BUN 17 11/29/2013 1233   CREATININE 1.07 10/16/2020 2117   CREATININE 1.34 (H) 11/29/2013 1233   CALCIUM 9.2 10/16/2020 2117   CALCIUM 8.6 11/29/2013 1233   PROT 6.8 10/16/2020 2117   PROT 7.9 11/29/2013 1233   ALBUMIN 4.1 10/16/2020 2117   ALBUMIN 4.1 11/29/2013 1233   AST 33 10/16/2020 2117   AST 28 11/29/2013 1233   ALT 29 10/16/2020 2117   ALT 37 11/29/2013 1233   ALKPHOS 33 (L) 10/16/2020 2117   ALKPHOS 71 11/29/2013 1233   BILITOT 1.3 (H) 10/16/2020 2117   BILITOT 0.6 11/29/2013 1233   GFRNONAA >60 10/16/2020 2117   GFRNONAA >60 11/29/2013 1233   GFRAA >60 01/10/2018 1212   GFRAA >60 11/29/2013 1233     Assessment and Plan: 1. Other iron deficiency anemia   2. Family history of cancer   3. Vitamin B12 deficiency   4. Hyponatremia     # iron deficiency anemia S/p IV venofer treatments.  Labs are reviewed and discussed with patient. Hemoglobin has improved and normalized.  Iron saturation is still low. Proceed with one dose of IV Venofer.   # Vitamin B12 deficiency He has received weekly B12 x 4 and currently on monthly B12.  B12 level is still low.  Recommend to proceed weekly Vitamin B12 x 4, in combination with sublingual B12 daily Repeat B12 in 4-6 weeks.  Check intrinsic factor antibody and antiparietal antibody   # Chronic hyponatremia Serum osmolarity 278 Urine osmolarity 276 - low, likely hyponatremia is not ADH dependant. ? Etoh, polydipsia, low solute diet Urine sodium 21 10/01/2020 CT abdomen pelvis no mass or lymphadenopathy.  12/21/2020 CXR negative. CT chest was denied by insurance.   # Family history of cancer,  Seen by genetic counselor. Genetic testing is negative for pathogenic  mutations.   Follow Up Instructions: 4- weeks.    I discussed the assessment and treatment plan with the patient. The patient was provided an opportunity to ask questions and all were answered. The patient agreed with the plan and  demonstrated an understanding of the instructions.  The patient was advised to call back or seek an in-person evaluation if the symptoms worsen or if the condition fails to improve as anticipated.  Rickard Patience, MD 01/17/2021 9:14 PM

## 2021-01-17 NOTE — Progress Notes (Signed)
Pt contacted for Mychart visit. No new concerns voiced.  

## 2021-01-17 NOTE — Telephone Encounter (Signed)
RN called and left message regarding the scheduled my chart video appt scheduled for today at 1215p and was requesting to review some information with the pt.  RN will call pt back closer to scheduled time.

## 2021-01-22 ENCOUNTER — Inpatient Hospital Stay: Payer: Managed Care, Other (non HMO)

## 2021-01-22 ENCOUNTER — Other Ambulatory Visit: Payer: Self-pay

## 2021-01-22 VITALS — BP 156/103 | HR 63 | Temp 96.9°F | Resp 18

## 2021-01-22 DIAGNOSIS — E538 Deficiency of other specified B group vitamins: Secondary | ICD-10-CM | POA: Diagnosis present

## 2021-01-22 DIAGNOSIS — D509 Iron deficiency anemia, unspecified: Secondary | ICD-10-CM | POA: Diagnosis not present

## 2021-01-22 DIAGNOSIS — D508 Other iron deficiency anemias: Secondary | ICD-10-CM

## 2021-01-22 MED ORDER — SODIUM CHLORIDE 0.9 % IV SOLN: 200.0000 mg | Freq: Once | INTRAVENOUS | Status: DC

## 2021-01-22 MED ORDER — IRON SUCROSE 20 MG/ML IV SOLN
200.0000 mg | Freq: Once | INTRAVENOUS | Status: AC
  Administered 2021-01-22: 200 mg via INTRAVENOUS
  Filled 2021-01-22: qty 10

## 2021-01-22 MED ORDER — SODIUM CHLORIDE 0.9 % IV SOLN
Freq: Once | INTRAVENOUS | Status: AC
Start: 2021-01-22 — End: 2021-01-22
  Filled 2021-01-22: qty 250

## 2021-01-22 MED ORDER — CYANOCOBALAMIN 1000 MCG/ML IJ SOLN
1000.0000 ug | Freq: Once | INTRAMUSCULAR | Status: AC
  Administered 2021-01-22: 1000 ug via INTRAMUSCULAR
  Filled 2021-01-22: qty 1

## 2021-01-22 NOTE — Patient Instructions (Addendum)
[facility] ONCOLOGY  Discharge Instructions: Thank you for choosing [facility] to provide your oncology and hematology care.  If you have a lab appointment with the [facility], please go directly to the [facility] and check in at the registration area.  Wear comfortable clothing and clothing appropriate for easy access to any Portacath or PICC line.   We strive to give you quality time with your provider. You may need to reschedule your appointment if you arrive late (15 or more minutes).  Arriving late affects you and other patients whose appointments are after yours.  Also, if you miss three or more appointments without notifying the office, you may be dismissed from the clinic at the provider's discretion.      For prescription refill requests, have your pharmacy contact our office and allow 72 hours for refills to be completed.    Today you received the following chemotherapy and/or immunotherapy agents VENOFER and VITAMIN B12       To help prevent nausea and vomiting after your treatment, we encourage you to take your nausea medication as directed.  BELOW ARE SYMPTOMS THAT SHOULD BE REPORTED IMMEDIATELY: *FEVER GREATER THAN 100.4 F (38 C) OR HIGHER *CHILLS OR SWEATING *NAUSEA AND VOMITING THAT IS NOT CONTROLLED WITH YOUR NAUSEA MEDICATION *UNUSUAL SHORTNESS OF BREATH *UNUSUAL BRUISING OR BLEEDING *URINARY PROBLEMS (pain or burning when urinating, or frequent urination) *BOWEL PROBLEMS (unusual diarrhea, constipation, pain near the anus) TENDERNESS IN MOUTH AND THROAT WITH OR WITHOUT PRESENCE OF ULCERS (sore throat, sores in mouth, or a toothache) UNUSUAL RASH, SWELLING OR PAIN  UNUSUAL VAGINAL DISCHARGE OR ITCHING   Items with * indicate a potential emergency and should be followed up as soon as possible or go to the Emergency Department if any problems should occur.  Please show the CHEMOTHERAPY ALERT CARD or IMMUNOTHERAPY ALERT  CARD at check-in to the Emergency Department and triage nurse.  Should you have questions after your visit or need to cancel or reschedule your appointment, please contact [facility]  [phone] and follow the prompts.  Office hours are 8:00 a.m. to 4:30 p.m. Monday - Friday. Please note that voicemails left after 4:00 p.m. may not be returned until the following business day.  We are closed weekends and major holidays. You have access to a nurse at all times for urgent questions. Please call the main number to the clinic [phone] and follow the prompts.  For any non-urgent questions, you may also contact your provider using MyChart. We now offer e-Visits for anyone 35 and older to request care online for non-urgent symptoms. For details visit [email or web address].   Also download the MyChart app! Go to the app store, search "MyChart", open the app, select [facility], and log in with your MyChart username and password.  Due to Covid, a mask is required upon entering the hospital/clinic. If you do not have a mask, one will be given to you upon arrival. For doctor visits, patients may have 1 support person aged 27 or older with them. For treatment visits, patients cannot have anyone with them due to current Covid guidelines and our immunocompromised population.   Iron Sucrose Injection What is this medication? IRON SUCROSE (EYE ern SOO krose) treats low levels of iron (iron deficiency anemia) in people with kidney disease. Iron is a mineral that plays an important role in making red blood cells, which carry oxygen from your lungs to the rest of your  body. This medicine may be used for other purposes; ask your health care provider or pharmacist if you have questions. COMMON BRAND NAME(S): Venofer What should I tell my care team before I take this medication? They need to know if you have any of these conditions: Anemia not caused by low iron levels Heart  disease High levels of iron in the blood Kidney disease Liver disease An unusual or allergic reaction to iron, other medications, foods, dyes, or preservatives Pregnant or trying to get pregnant Breast-feeding How should I use this medication? This medication is for infusion into a vein. It is given in a hospital or clinic setting. Talk to your care team about the use of this medication in children. While this medication may be prescribed for children as young as 2 years for selected conditions, precautions do apply. Overdosage: If you think you have taken too much of this medicine contact a poison control center or emergency room at once. NOTE: This medicine is only for you. Do not share this medicine with others. What if I miss a dose? It is important not to miss your dose. Call your care team if you are unable to keep an appointment. What may interact with this medication? Do not take this medication with any of the following: Deferoxamine Dimercaprol Other iron products This medication may also interact with the following: Chloramphenicol Deferasirox This list may not describe all possible interactions. Give your health care provider a list of all the medicines, herbs, non-prescription drugs, or dietary supplements you use. Also tell them if you smoke, drink alcohol, or use illegal drugs. Some items may interact with your medicine. What should I watch for while using this medication? Visit your care team regularly. Tell your care team if your symptoms do not start to get better or if they get worse. You may need blood work done while you are taking this medication. You may need to follow a special diet. Talk to your care team. Foods that contain iron include: whole grains/cereals, dried fruits, beans, or peas, leafy green vegetables, and organ meats (liver, kidney). What side effects may I notice from receiving this medication? Side effects that you should report to your care team as  soon as possible: Allergic reactions-skin rash, itching, hives, swelling of the face, lips, tongue, or throat Low blood pressure-dizziness, feeling faint or lightheaded, blurry vision Shortness of breath Side effects that usually do not require medical attention (report to your care team if they continue or are bothersome): Flushing Headache Joint pain Muscle pain Nausea Pain, redness, or irritation at injection site This list may not describe all possible side effects. Call your doctor for medical advice about side effects. You may report side effects to FDA at 1-800-FDA-1088. Where should I keep my medication? This medication is given in a hospital or clinic and will not be stored at home. NOTE: This sheet is a summary. It may not cover all possible information. If you have questions about this medicine, talk to your doctor, pharmacist, or health care provider.  2022 Elsevier/Gold Standard (2020-07-30 12:52:06)   Vitamin B12 Injection What is this medication? Vitamin B12 (VAHY tuh min B12) prevents and treats low vitamin B12 levels in your body. It is used in people who do not get enough vitamin B12 from their diet or when their digestive tract does not absorb enough. Vitamin B12 plays an important role in maintaining the health of your nervous system and red blood cells. This medicine may be used for  other purposes; ask your health care provider or pharmacist if you have questions. COMMON BRAND NAME(S): B-12 Compliance Kit, B-12 Injection Kit, Cyomin, Dodex, LA-12, Nutri-Twelve, Physicians EZ Use B-12, Primabalt What should I tell my care team before I take this medication? They need to know if you have any of these conditions: Kidney disease Leber's disease Megaloblastic anemia An unusual or allergic reaction to cyanocobalamin, cobalt, other medications, foods, dyes, or preservatives Pregnant or trying to get pregnant Breast-feeding How should I use this medication? This  medication is injected into a muscle or deeply under the skin. It is usually given in a clinic or care team's office. However, your care team may teach you how to inject yourself. Follow all instructions. Talk to your care team about the use of this medication in children. Special care may be needed. Overdosage: If you think you have taken too much of this medicine contact a poison control center or emergency room at once. NOTE: This medicine is only for you. Do not share this medicine with others. What if I miss a dose? If you are given your dose at a clinic or care team's office, call to reschedule your appointment. If you give your own injections, and you miss a dose, take it as soon as you can. If it is almost time for your next dose, take only that dose. Do not take double or extra doses. What may interact with this medication? Colchicine Heavy alcohol intake This list may not describe all possible interactions. Give your health care provider a list of all the medicines, herbs, non-prescription drugs, or dietary supplements you use. Also tell them if you smoke, drink alcohol, or use illegal drugs. Some items may interact with your medicine. What should I watch for while using this medication? Visit your care team regularly. You may need blood work done while you are taking this medication. You may need to follow a special diet. Talk to your care team. Limit your alcohol intake and avoid smoking to get the best benefit. What side effects may I notice from receiving this medication? Side effects that you should report to your care team as soon as possible: Allergic reactions-skin rash, itching, hives, swelling of the face, lips, tongue, or throat Swelling of the ankles, hands, or feet Trouble breathing Side effects that usually do not require medical attention (report to your care team if they continue or are bothersome): Diarrhea This list may not describe all possible side effects. Call your  doctor for medical advice about side effects. You may report side effects to FDA at 1-800-FDA-1088. Where should I keep my medication? Keep out of the reach of children. Store at room temperature between 15 and 30 degrees C (59 and 85 degrees F). Protect from light. Throw away any unused medication after the expiration date. NOTE: This sheet is a summary. It may not cover all possible information. If you have questions about this medicine, talk to your doctor, pharmacist, or health care provider.  2022 Elsevier/Gold Standard (2020-06-24 11:47:06)

## 2021-01-22 NOTE — Progress Notes (Signed)
Pt aware of inc BP- denies any lightheaded / dizziness/ feeling off.  Will follow up with health nurse at his work

## 2021-01-29 ENCOUNTER — Inpatient Hospital Stay: Payer: Managed Care, Other (non HMO)

## 2021-01-29 DIAGNOSIS — D509 Iron deficiency anemia, unspecified: Secondary | ICD-10-CM | POA: Diagnosis not present

## 2021-01-29 DIAGNOSIS — D508 Other iron deficiency anemias: Secondary | ICD-10-CM

## 2021-01-29 MED ORDER — CYANOCOBALAMIN 1000 MCG/ML IJ SOLN
1000.0000 ug | Freq: Once | INTRAMUSCULAR | Status: AC
  Administered 2021-01-29: 1000 ug via INTRAMUSCULAR
  Filled 2021-01-29: qty 1

## 2021-02-05 ENCOUNTER — Inpatient Hospital Stay: Payer: Managed Care, Other (non HMO)

## 2021-02-05 DIAGNOSIS — D509 Iron deficiency anemia, unspecified: Secondary | ICD-10-CM | POA: Diagnosis not present

## 2021-02-05 DIAGNOSIS — D508 Other iron deficiency anemias: Secondary | ICD-10-CM

## 2021-02-05 MED ORDER — CYANOCOBALAMIN 1000 MCG/ML IJ SOLN
1000.0000 ug | Freq: Once | INTRAMUSCULAR | Status: AC
  Administered 2021-02-05: 1000 ug via INTRAMUSCULAR
  Filled 2021-02-05: qty 1

## 2021-02-12 ENCOUNTER — Inpatient Hospital Stay: Payer: Managed Care, Other (non HMO)

## 2021-02-12 ENCOUNTER — Ambulatory Visit: Payer: Managed Care, Other (non HMO)

## 2021-02-12 ENCOUNTER — Other Ambulatory Visit: Payer: Managed Care, Other (non HMO)

## 2021-02-12 DIAGNOSIS — Z809 Family history of malignant neoplasm, unspecified: Secondary | ICD-10-CM

## 2021-02-12 DIAGNOSIS — E538 Deficiency of other specified B group vitamins: Secondary | ICD-10-CM

## 2021-02-12 DIAGNOSIS — E871 Hypo-osmolality and hyponatremia: Secondary | ICD-10-CM

## 2021-02-12 DIAGNOSIS — D508 Other iron deficiency anemias: Secondary | ICD-10-CM

## 2021-02-12 DIAGNOSIS — D509 Iron deficiency anemia, unspecified: Secondary | ICD-10-CM | POA: Diagnosis not present

## 2021-02-12 LAB — VITAMIN B12: Vitamin B-12: 269 pg/mL (ref 180–914)

## 2021-02-12 LAB — CBC WITH DIFFERENTIAL/PLATELET
Abs Immature Granulocytes: 0.02 10*3/uL (ref 0.00–0.07)
Basophils Absolute: 0.1 10*3/uL (ref 0.0–0.1)
Basophils Relative: 1 %
Eosinophils Absolute: 0.9 10*3/uL — ABNORMAL HIGH (ref 0.0–0.5)
Eosinophils Relative: 15 %
HCT: 44.5 % (ref 39.0–52.0)
Hemoglobin: 14.5 g/dL (ref 13.0–17.0)
Immature Granulocytes: 0 %
Lymphocytes Relative: 22 %
Lymphs Abs: 1.3 10*3/uL (ref 0.7–4.0)
MCH: 26.9 pg (ref 26.0–34.0)
MCHC: 32.6 g/dL (ref 30.0–36.0)
MCV: 82.4 fL (ref 80.0–100.0)
Monocytes Absolute: 0.6 10*3/uL (ref 0.1–1.0)
Monocytes Relative: 10 %
Neutro Abs: 3 10*3/uL (ref 1.7–7.7)
Neutrophils Relative %: 52 %
Platelets: 341 10*3/uL (ref 150–400)
RBC: 5.4 MIL/uL (ref 4.22–5.81)
RDW: 21.4 % — ABNORMAL HIGH (ref 11.5–15.5)
WBC: 5.8 10*3/uL (ref 4.0–10.5)
nRBC: 0 % (ref 0.0–0.2)

## 2021-02-12 LAB — COMPREHENSIVE METABOLIC PANEL
ALT: 27 U/L (ref 0–44)
AST: 30 U/L (ref 15–41)
Albumin: 4.4 g/dL (ref 3.5–5.0)
Alkaline Phosphatase: 38 U/L (ref 38–126)
Anion gap: 6 (ref 5–15)
BUN: 8 mg/dL (ref 6–20)
CO2: 26 mmol/L (ref 22–32)
Calcium: 8.9 mg/dL (ref 8.9–10.3)
Chloride: 97 mmol/L — ABNORMAL LOW (ref 98–111)
Creatinine, Ser: 1.09 mg/dL (ref 0.61–1.24)
GFR, Estimated: >60 mL/min (ref >60)
Glucose, Bld: 55 mg/dL — ABNORMAL LOW (ref 70–99)
Potassium: 4.1 mmol/L (ref 3.5–5.1)
Sodium: 129 mmol/L — ABNORMAL LOW (ref 135–145)
Total Bilirubin: 1 mg/dL (ref 0.3–1.2)
Total Protein: 7.3 g/dL (ref 6.5–8.1)

## 2021-02-12 LAB — IRON AND TIBC
Iron: 43 ug/dL — ABNORMAL LOW (ref 45–182)
Saturation Ratios: 11 % — ABNORMAL LOW (ref 17.9–39.5)
TIBC: 396 ug/dL (ref 250–450)
UIBC: 353 ug/dL

## 2021-02-12 LAB — FERRITIN: Ferritin: 14 ng/mL — ABNORMAL LOW (ref 24–336)

## 2021-02-12 MED ORDER — CYANOCOBALAMIN 1000 MCG/ML IJ SOLN
1000.0000 ug | Freq: Once | INTRAMUSCULAR | Status: AC
Start: 2021-02-12 — End: 2021-02-12
  Administered 2021-02-12: 1000 ug via INTRAMUSCULAR
  Filled 2021-02-12: qty 1

## 2021-02-13 LAB — ANTI-PARIETAL ANTIBODY: Parietal Cell Antibody-IgG: 18.7 Units (ref 0.0–20.0)

## 2021-02-15 LAB — INTRINSIC FACTOR ANTIBODIES: Intrinsic Factor: 8.6 AU/mL — ABNORMAL HIGH (ref 0.0–1.1)

## 2021-02-17 ENCOUNTER — Inpatient Hospital Stay: Payer: Managed Care, Other (non HMO) | Attending: Oncology | Admitting: Oncology

## 2021-02-17 ENCOUNTER — Inpatient Hospital Stay: Payer: Managed Care, Other (non HMO)

## 2021-02-17 ENCOUNTER — Encounter: Payer: Self-pay | Admitting: Oncology

## 2021-02-17 VITALS — BP 157/90 | HR 76 | Temp 98.2°F | Resp 18 | Wt 207.6 lb

## 2021-02-17 DIAGNOSIS — E871 Hypo-osmolality and hyponatremia: Secondary | ICD-10-CM | POA: Diagnosis not present

## 2021-02-17 DIAGNOSIS — Z809 Family history of malignant neoplasm, unspecified: Secondary | ICD-10-CM

## 2021-02-17 DIAGNOSIS — D509 Iron deficiency anemia, unspecified: Secondary | ICD-10-CM | POA: Insufficient documentation

## 2021-02-17 DIAGNOSIS — E538 Deficiency of other specified B group vitamins: Secondary | ICD-10-CM | POA: Insufficient documentation

## 2021-02-17 DIAGNOSIS — D508 Other iron deficiency anemias: Secondary | ICD-10-CM | POA: Diagnosis not present

## 2021-02-17 MED ORDER — CYANOCOBALAMIN 1000 MCG/ML IJ SOLN
1000.0000 ug | Freq: Once | INTRAMUSCULAR | Status: AC
  Administered 2021-02-17: 1000 ug via INTRAMUSCULAR
  Filled 2021-02-17: qty 1

## 2021-02-17 NOTE — Progress Notes (Signed)
Hematology/Oncology Consult note [facility] Telephone:([phone] [phone]   Patient Care Team: Pcp, No as PCP - General Rickard Patience, MD as Consulting Physician (Hematology and Oncology)  REFERRING PROVIDER: No ref. provider found  CHIEF COMPLAINTS/REASON FOR VISIT:   iron deficiency anemia, B12 deficiency.   HISTORY OF PRESENTING ILLNESS:   Trevor Torres is a  50 y.o.  male with PMH listed below was seen in consultation at the request of  No ref. provider found  for evaluation of iron deficiency anemia  Patient has history of gastric sleeve. 10/01/2020- 10/04/2020 admitted due to near syncope, echo showed LVEF 50-55% with normal diastolic function, no wall motion abnormalities. Hyponatremia, which improved with hydration.  Low B12 level at 60, started on B12 supplementation.  Anemia, microcytic, low iron, started on oral iron supplementation.  Dysphagia, S/p EGD which showed mucosal changes in the duodenum, biopsied & biopsies taken at the gastric body, incisura & gastric antrum. S/p colonoscopy showed non-bleeding internal hemorrhoids.  all biopsies negative for malignancy, dysplasia. Negative H Pylori. 10/16/2020 ED visit for fatigue. Hb slightly improved to 10.9.  10/24/2020 seen [facility]. 11/04/2020 capsule study is negative.   He also has chronic hyponatremia and he puts salt on his food.  Significant family history of cancer- see family history. He smoked socially for a few years in his 74s. Quitted remotely.   INTERVAL HISTORY Trevor Torres is a 50 y.o. male who has above history reviewed by me today presents for follow up visit for management of  iron deficiency anemia, B12 deficiency.  Feels weak and fatigued. Denies any bleeding events.  No improvement after previous iron infusions.    Review of Systems  Constitutional:  Positive for fatigue. Negative for appetite change, chills, fever and unexpected weight change.  HENT:   Negative for  hearing loss and voice change.   Eyes:  Negative for eye problems and icterus.  Respiratory:  Negative for chest tightness, cough and shortness of breath.   Cardiovascular:  Negative for chest pain and leg swelling.  Gastrointestinal:  Negative for abdominal distention and abdominal pain.  Endocrine: Negative for hot flashes.  Genitourinary:  Negative for difficulty urinating, dysuria and frequency.   Musculoskeletal:  Negative for arthralgias.  Skin:  Negative for itching and rash.  Neurological:  Negative for light-headedness and numbness.  Hematological:  Negative for adenopathy. Does not bruise/bleed easily.  Psychiatric/Behavioral:  Negative for confusion.    MEDICAL HISTORY:  Past Medical History:  Diagnosis Date   Anemia    Anxiety    Cellulitis 01/2015   left arm   Family history of breast cancer    Family history of breast cancer    Family history of colon cancer    Family history of kidney cancer    Family history of lung cancer    Family history of prostate cancer    GERD (gastroesophageal reflux disease)    H/O PRIOR TO WEIGHT LOSS   Headache    HTN (hypertension) 01/07/2015   H/O/ GASTRIC SLEEVE /WT LOSS   Hypothyroidism    not since weight loss   MRSA infection 2014   h/o years ago   PONV (postoperative nausea and vomiting) 01/29/2020   Renal insufficiency    error in entry   Sleep apnea    H/O PRIOR TO WEIGHT LOSS    SURGICAL HISTORY: Past Surgical History:  Procedure Laterality Date   CHOLECYSTECTOMY N/A 01/14/2018   Procedure: LAPAROSCOPIC CHOLECYSTECTOMY;  Surgeon: Earlene Plater,  Rosana Berger, MD;  Location: [facility] ORS;  Service: General;  Laterality: N/A;   COLONOSCOPY  2015   COLONOSCOPY N/A 10/03/2020   Procedure: COLONOSCOPY;  Surgeon: Pasty Spillers, MD;  Location: [facility] ENDOSCOPY;  Service: Endoscopy;  Laterality: N/A;   ESOPHAGOGASTRODUODENOSCOPY  2015   ESOPHAGOGASTRODUODENOSCOPY N/A 10/03/2020   Procedure: ESOPHAGOGASTRODUODENOSCOPY (EGD);   Surgeon: Pasty Spillers, MD;  Location: [facility] ENDOSCOPY;  Service: Endoscopy;  Laterality: N/A;   LAPAROSCOPIC GASTRIC SLEEVE RESECTION  2013   SHOULDER ARTHROSCOPY WITH OPEN ROTATOR CUFF REPAIR Right 02/06/2015   Procedure: SHOULDER ARTHROSCOPY WITH OPEN ROTATOR CUFF REPAIR;  Surgeon: Juanell Fairly, MD;  Location: [facility] ORS;  Service: Orthopedics;  Laterality: Right;   XI ROBOTIC ASSISTED VENTRAL HERNIA N/A 01/29/2020   Procedure: XI ROBOTIC ASSISTED INCISIONAL HERNIA REPAIR WITH MESH;  Surgeon: Carolan Shiver, MD;  Location: [facility] ORS;  Service: General;  Laterality: N/A;    SOCIAL HISTORY: Social History   Socioeconomic History   Marital status: Divorced    Spouse name: Not on file   Number of children: Not on file   Years of education: Not on file   Highest education level: Not on file  Occupational History   Not on file  Tobacco Use   Smoking status: Never   Smokeless tobacco: Never  Vaping Use   Vaping Use: Never used  Substance and Sexual Activity   Alcohol use: Not Currently    Comment: 3-4 beers per week   Drug use: No   Sexual activity: Not on file  Other Topics Concern   Not on file  Social History Narrative   Not on file   Social Determinants of Health   Financial Resource Strain: Not on file  Food Insecurity: Not on file  Transportation Needs: Not on file  Physical Activity: Not on file  Stress: Not on file  Social Connections: Not on file  Intimate Partner Violence: Not on file    FAMILY HISTORY: Family History  Problem Relation Age of Onset   Cancer Mother        Breast Ca   Diabetes Mother    Hypertension Mother    Cancer Father        Lung Ca   Cancer Maternal Aunt        Colon and Kidney Ca   Cancer Maternal Aunt        Breast Ca   Cancer Maternal Grandfather        Lung Ca    ALLERGIES:  has no active allergies.  MEDICATIONS:  Current Outpatient Medications  Medication Sig Dispense Refill   acetaminophen (TYLENOL) 500 MG  tablet Take 500-1,000 mg by mouth every 6 (six) hours as needed (for pain.).     Cholecalciferol (VITAMIN D3) 125 MCG (5000 UT) CAPS Take 1 capsule by mouth daily.     Cyanocobalamin (VITAMIN B-12) 1000 MCG SUBL Place 1 tablet (1,000 mcg total) under the tongue daily. 30 tablet 2   ferrous gluconate (FERGON) 324 MG tablet Take 1 tablet (324 mg total) by mouth 2 (two) times daily with a meal. 60 tablet 0   ibuprofen (ADVIL) 600 MG tablet Take 1 tablet (600 mg total) by mouth every 6 (six) hours as needed. 30 tablet 0   Multiple Vitamin (MULTIVITAMIN WITH MINERALS) TABS tablet Take 1 tablet by mouth daily.     sildenafil (VIAGRA) 25 MG tablet Take by mouth.     traZODone (DESYREL) 50 MG tablet Take 0.5 tablets (25 mg total) by mouth  at bedtime as needed for sleep. 15 tablet 0   zinc gluconate 50 MG tablet Take 50 mg by mouth daily.     metaxalone (SKELAXIN) 800 MG tablet Take by mouth. (Patient not taking: No sig reported)     No current facility-administered medications for this visit.     PHYSICAL EXAMINATION: ECOG PERFORMANCE STATUS: 1 - Symptomatic but completely ambulatory Vitals:   02/17/21 1452  BP: (!) 157/90  Pulse: 76  Resp: 18  Temp: 98.2 F (36.8 C)  SpO2: 98%   Filed Weights   02/17/21 1452  Weight: 207 lb 9.6 oz (94.2 kg)    Physical Exam Constitutional:      General: He is not in acute distress. HENT:     Head: Normocephalic and atraumatic.  Eyes:     General: No scleral icterus. Cardiovascular:     Rate and Rhythm: Normal rate and regular rhythm.     Heart sounds: Normal heart sounds.  Pulmonary:     Effort: Pulmonary effort is normal. No respiratory distress.     Breath sounds: No wheezing.  Abdominal:     General: Bowel sounds are normal. There is no distension.     Palpations: Abdomen is soft.  Musculoskeletal:        General: No deformity. Normal range of motion.     Cervical back: Normal range of motion and neck supple.  Skin:    General: Skin is  warm and dry.     Findings: No erythema or rash.  Neurological:     Mental Status: He is alert and oriented to person, place, and time. Mental status is at baseline.     Cranial Nerves: No cranial nerve deficit.     Coordination: Coordination normal.  Psychiatric:        Mood and Affect: Mood normal.    LABORATORY DATA:  I have reviewed the data as listed Lab Results  Component Value Date   WBC 5.8 02/12/2021   HGB 14.5 02/12/2021   HCT 44.5 02/12/2021   MCV 82.4 02/12/2021   PLT 341 02/12/2021   Recent Labs    10/02/20 0440 10/03/20 0400 10/04/20 0505 10/16/20 2117 02/12/21 1113  NA 130*   < > 132* 131* 129*  K 4.2   < > 4.2 4.1 4.1  CL 99   < > 102 96* 97*  CO2 24   < > 23 24 26   GLUCOSE 84   < > 79 84 55*  BUN 14   < > 11 22* 8  CREATININE 0.87   < > 0.98 1.07 1.09  CALCIUM 8.8*   < > 8.6* 9.2 8.9  GFRNONAA >60   < > >60 >60 >60  PROT 6.7  --   --  6.8 7.3  ALBUMIN 3.8  --   --  4.1 4.4  AST 34  --   --  33 30  ALT 31  --   --  29 27  ALKPHOS 34*  --   --  33* 38  BILITOT 1.0  --   --  1.3* 1.0   < > = values in this interval not displayed.    Iron/TIBC/Ferritin/ %Sat    Component Value Date/Time   IRON 43 (L) 02/12/2021 1108   IRON 94 12/07/2012 0845   TIBC 396 02/12/2021 1108   TIBC 372 12/07/2012 0845   FERRITIN 14 (L) 02/12/2021 1108   FERRITIN 49 12/07/2012 0845   IRONPCTSAT 11 (L) 02/12/2021 1108  IRONPCTSAT 25 12/07/2012 0845       RADIOGRAPHIC STUDIES: I have personally reviewed the radiological images as listed and agreed with the findings in the report. DG Chest 2 View  Result Date: 01/01/2021 CLINICAL DATA:  Hyponatremia EXAM: CHEST - 2 VIEW COMPARISON:  05/23/2020 FINDINGS: Normal heart size, mediastinal contours, and pulmonary vascularity. Lungs clear. No pleural effusion or pneumothorax. Minimal scoliosis. IMPRESSION: No acute abnormalities. Electronically Signed   By: Ulyses Southward M.D.   On: 01/01/2021 10:55       ASSESSMENT &  PLAN:  1. Other iron deficiency anemia   2. Vitamin B12 deficiency   3. Hyponatremia   4. Family history of cancer    # Iron deficiency anemia.  Labs are reviewed and discussed with patient. Hemoglobin has improved. He has persistent iron deficiency. Suspect ongoing blood loss.  Previously had EGD/Colonoscopy/capsule study which showed no bleeding.  Recommend additional IV venofer weekly x 3  # Vitamin B12 deficiency, positive intrinsive factor antibody. - pernicious anemia.   has improved. B12 level is 269 today, continue B12 injection monthly.  # Chronic hyponatremia. Normal morning cortisol, serum osm 275, urine osm decreased at 277.  likely hyponatremia is not ADH dependant. ? Etoh, polydipsia, low solute diet 10/01/2020 CT abdomen pelvis no mass or lymphadenopathy 12/21/2020 CXR negative. Obtain CT chest   Family history of cancer. Genetic testing is negative.    All questions were answered. The patient knows to call the clinic with any problems questions or concerns.  Return of visit: 8 weeks. labs cbc iron tibc ferritin, B12 level/ B12 injection - prior to virtual MD + venofer   Rickard Patience, MD, PhD 02/17/2021

## 2021-02-17 NOTE — Progress Notes (Signed)
Pt here for IDA. No visible bleeding. Pt feels weak, fatigued. No energy.No improvement after iron infusions.

## 2021-02-18 ENCOUNTER — Telehealth: Payer: Self-pay

## 2021-02-18 NOTE — Telephone Encounter (Signed)
-----   Message from Rickard Patience, MD sent at 02/17/2021  9:46 PM EDT ----- Please reschedule his CT scan [ his preference].  Hopefully with CXR checked, CT could be authorized. Thanks.

## 2021-02-18 NOTE — Telephone Encounter (Signed)
Please check on CT auth and schedule scan. Thanks

## 2021-02-19 ENCOUNTER — Inpatient Hospital Stay: Payer: Managed Care, Other (non HMO)

## 2021-02-19 VITALS — BP 152/101 | HR 58 | Temp 96.1°F | Resp 16

## 2021-02-19 DIAGNOSIS — D508 Other iron deficiency anemias: Secondary | ICD-10-CM

## 2021-02-19 DIAGNOSIS — D509 Iron deficiency anemia, unspecified: Secondary | ICD-10-CM | POA: Diagnosis not present

## 2021-02-19 MED ORDER — IRON SUCROSE 20 MG/ML IV SOLN
200.0000 mg | Freq: Once | INTRAVENOUS | Status: AC
  Administered 2021-02-19: 200 mg via INTRAVENOUS
  Filled 2021-02-19: qty 10

## 2021-02-19 MED ORDER — SODIUM CHLORIDE 0.9 % IV SOLN: 200.0000 mg | Freq: Once | INTRAVENOUS | Status: DC

## 2021-02-19 MED ORDER — SODIUM CHLORIDE 0.9 % IV SOLN
Freq: Once | INTRAVENOUS | Status: AC
  Filled 2021-02-19: qty 250

## 2021-02-19 NOTE — Patient Instructions (Signed)

## 2021-02-19 NOTE — Progress Notes (Signed)
Pt tolerated venofer infusion well today with no problems or complaints. Pt left infusion suite stable and ambulatory.  

## 2021-02-21 NOTE — Telephone Encounter (Signed)
CT chest was denied again.

## 2021-02-24 ENCOUNTER — Encounter: Payer: Self-pay | Admitting: Oncology

## 2021-02-24 NOTE — Telephone Encounter (Signed)
Mychart message sent to pt letting him know of this.

## 2021-02-26 ENCOUNTER — Inpatient Hospital Stay: Payer: Managed Care, Other (non HMO)

## 2021-02-26 ENCOUNTER — Other Ambulatory Visit: Payer: Self-pay

## 2021-02-26 VITALS — BP 133/83 | HR 66 | Temp 96.1°F | Resp 18

## 2021-02-26 DIAGNOSIS — D509 Iron deficiency anemia, unspecified: Secondary | ICD-10-CM | POA: Diagnosis not present

## 2021-02-26 DIAGNOSIS — D508 Other iron deficiency anemias: Secondary | ICD-10-CM

## 2021-02-26 MED ORDER — SODIUM CHLORIDE 0.9 % IV SOLN
Freq: Once | INTRAVENOUS | Status: AC
Start: 2021-02-26 — End: 2021-02-26
  Filled 2021-02-26: qty 250

## 2021-02-26 MED ORDER — SODIUM CHLORIDE 0.9 % IV SOLN: 200.0000 mg | Freq: Once | INTRAVENOUS | Status: DC

## 2021-02-26 MED ORDER — IRON SUCROSE 20 MG/ML IV SOLN
200.0000 mg | Freq: Once | INTRAVENOUS | Status: AC
  Administered 2021-02-26: 200 mg via INTRAVENOUS
  Filled 2021-02-26: qty 10

## 2021-02-26 NOTE — Patient Instructions (Signed)
[facility] [place] [facility]   Discharge Instructions: Thank you for choosing [facility] to provide your oncology and hematology care.  If you have a lab appointment with the Cancer Center, please go directly to the Cancer Center and check in at the registration area.  We strive to give you quality time with your provider. You may need to reschedule your appointment if you arrive late (15 or more minutes).  Arriving late affects you and other patients whose appointments are after yours.  Also, if you miss three or more appointments without notifying the office, you may be dismissed from the clinic at the provider's discretion.      For prescription refill requests, have your pharmacy contact our office and allow 72 hours for refills to be completed.    Today you received the following: Venofer.      BELOW ARE SYMPTOMS THAT SHOULD BE REPORTED IMMEDIATELY: *FEVER GREATER THAN 100.4 F (38 C) OR HIGHER *CHILLS OR SWEATING *NAUSEA AND VOMITING THAT IS NOT CONTROLLED WITH YOUR NAUSEA MEDICATION *UNUSUAL SHORTNESS OF BREATH *UNUSUAL BRUISING OR BLEEDING *URINARY PROBLEMS (pain or burning when urinating, or frequent urination) *BOWEL PROBLEMS (unusual diarrhea, constipation, pain near the anus) TENDERNESS IN MOUTH AND THROAT WITH OR WITHOUT PRESENCE OF ULCERS (sore throat, sores in mouth, or a toothache) UNUSUAL RASH, SWELLING OR PAIN  UNUSUAL VAGINAL DISCHARGE OR ITCHING   Items with * indicate a potential emergency and should be followed up as soon as possible or go to the Emergency Department if any problems should occur.  Should you have questions after your visit or need to cancel or reschedule your appointment, please contact [facility] [place] [facility]  [phone] and follow the prompts.  Office hours are 8:00 a.m. to 4:30 p.m. Monday - Friday. Please note that voicemails left after 4:00 p.m. may not be returned until the following  business day.  We are closed weekends and major holidays. You have access to a nurse at all times for urgent questions. Please call the main number to the clinic [phone] and follow the prompts.  For any non-urgent questions, you may also contact your provider using MyChart. We now offer e-Visits for anyone 18 and older to request care online for non-urgent symptoms. For details visit [email or web address].   Also download the MyChart app! Go to the app store, search "MyChart", open the app, select [facility], and log in with your MyChart username and password.  Due to Covid, a mask is required upon entering the hospital/clinic. If you do not have a mask, one will be given to you upon arrival. For doctor visits, patients may have 1 support person aged 18 or older with them. For treatment visits, patients cannot have anyone with them due to current Covid guidelines and our immunocompromised population.  

## 2021-03-05 ENCOUNTER — Other Ambulatory Visit: Payer: Self-pay

## 2021-03-05 ENCOUNTER — Inpatient Hospital Stay: Payer: Managed Care, Other (non HMO)

## 2021-03-05 VITALS — BP 141/93 | HR 72 | Temp 96.2°F | Resp 18

## 2021-03-05 DIAGNOSIS — D509 Iron deficiency anemia, unspecified: Secondary | ICD-10-CM | POA: Diagnosis not present

## 2021-03-05 DIAGNOSIS — D508 Other iron deficiency anemias: Secondary | ICD-10-CM

## 2021-03-05 MED ORDER — IRON SUCROSE 20 MG/ML IV SOLN
200.0000 mg | Freq: Once | INTRAVENOUS | Status: AC
Start: 2021-03-05 — End: 2021-03-05
  Administered 2021-03-05: 200 mg via INTRAVENOUS
  Filled 2021-03-05: qty 10

## 2021-03-05 MED ORDER — SODIUM CHLORIDE 0.9 % IV SOLN: 200.0000 mg | Freq: Once | INTRAVENOUS | Status: DC

## 2021-03-05 NOTE — Patient Instructions (Signed)
[facility] [place] [facility]  Discharge Instructions: Thank you for choosing [facility] to provide your oncology and hematology care.  If you have a lab appointment with the Cancer Center, please go directly to the Cancer Center and check in at the registration area.  Wear comfortable clothing and clothing appropriate for easy access to any Portacath or PICC line.   We strive to give you quality time with your provider. You may need to reschedule your appointment if you arrive late (15 or more minutes).  Arriving late affects you and other patients whose appointments are after yours.  Also, if you miss three or more appointments without notifying the office, you may be dismissed from the clinic at the provider's discretion.      For prescription refill requests, have your pharmacy contact our office and allow 72 hours for refills to be completed.    Today you received the following chemotherapy and/or immunotherapy agents VENOFER      To help prevent nausea and vomiting after your treatment, we encourage you to take your nausea medication as directed.  BELOW ARE SYMPTOMS THAT SHOULD BE REPORTED IMMEDIATELY: *FEVER GREATER THAN 100.4 F (38 C) OR HIGHER *CHILLS OR SWEATING *NAUSEA AND VOMITING THAT IS NOT CONTROLLED WITH YOUR NAUSEA MEDICATION *UNUSUAL SHORTNESS OF BREATH *UNUSUAL BRUISING OR BLEEDING *URINARY PROBLEMS (pain or burning when urinating, or frequent urination) *BOWEL PROBLEMS (unusual diarrhea, constipation, pain near the anus) TENDERNESS IN MOUTH AND THROAT WITH OR WITHOUT PRESENCE OF ULCERS (sore throat, sores in mouth, or a toothache) UNUSUAL RASH, SWELLING OR PAIN  UNUSUAL VAGINAL DISCHARGE OR ITCHING   Items with * indicate a potential emergency and should be followed up as soon as possible or go to the Emergency Department if any problems should occur.  Please show the CHEMOTHERAPY ALERT CARD or IMMUNOTHERAPY ALERT CARD at check-in to  the Emergency Department and triage nurse.  Should you have questions after your visit or need to cancel or reschedule your appointment, please contact [facility] [place] [facility]  [phone] and follow the prompts.  Office hours are 8:00 a.m. to 4:30 p.m. Monday - Friday. Please note that voicemails left after 4:00 p.m. may not be returned until the following business day.  We are closed weekends and major holidays. You have access to a nurse at all times for urgent questions. Please call the main number to the clinic [phone] and follow the prompts.  For any non-urgent questions, you may also contact your provider using MyChart. We now offer e-Visits for anyone 18 and older to request care online for non-urgent symptoms. For details visit [email or web address].   Also download the MyChart app! Go to the app store, search "MyChart", open the app, select [facility], and log in with your MyChart username and password.  Due to Covid, a mask is required upon entering the hospital/clinic. If you do not have a mask, one will be given to you upon arrival. For doctor visits, patients may have 1 support person aged 18 or older with them. For treatment visits, patients cannot have anyone with them due to current Covid guidelines and our immunocompromised population.   Iron Sucrose Injection What is this medication? IRON SUCROSE (EYE ern SOO krose) treats low levels of iron (iron deficiency anemia) in people with kidney disease. Iron is a mineral that plays an important role in making red blood cells, which carry oxygen from your lungs to the rest of your body. This medicine may   be used for other purposes; ask your health care provider or pharmacist if you have questions. COMMON BRAND NAME(S): Venofer What should I tell my care team before I take this medication? They need to know if you have any of these conditions: Anemia not caused by low iron levels Heart disease High levels  of iron in the blood Kidney disease Liver disease An unusual or allergic reaction to iron, other medications, foods, dyes, or preservatives Pregnant or trying to get pregnant Breast-feeding How should I use this medication? This medication is for infusion into a vein. It is given in a hospital or clinic setting. Talk to your care team about the use of this medication in children. While this medication may be prescribed for children as young as 2 years for selected conditions, precautions do apply. Overdosage: If you think you have taken too much of this medicine contact a poison control center or emergency room at once. NOTE: This medicine is only for you. Do not share this medicine with others. What if I miss a dose? It is important not to miss your dose. Call your care team if you are unable to keep an appointment. What may interact with this medication? Do not take this medication with any of the following: Deferoxamine Dimercaprol Other iron products This medication may also interact with the following: Chloramphenicol Deferasirox This list may not describe all possible interactions. Give your health care provider a list of all the medicines, herbs, non-prescription drugs, or dietary supplements you use. Also tell them if you smoke, drink alcohol, or use illegal drugs. Some items may interact with your medicine. What should I watch for while using this medication? Visit your care team regularly. Tell your care team if your symptoms do not start to get better or if they get worse. You may need blood work done while you are taking this medication. You may need to follow a special diet. Talk to your care team. Foods that contain iron include: whole grains/cereals, dried fruits, beans, or peas, leafy green vegetables, and organ meats (liver, kidney). What side effects may I notice from receiving this medication? Side effects that you should report to your care team as soon as  possible: Allergic reactions-skin rash, itching, hives, swelling of the face, lips, tongue, or throat Low blood pressure-dizziness, feeling faint or lightheaded, blurry vision Shortness of breath Side effects that usually do not require medical attention (report to your care team if they continue or are bothersome): Flushing Headache Joint pain Muscle pain Nausea Pain, redness, or irritation at injection site This list may not describe all possible side effects. Call your doctor for medical advice about side effects. You may report side effects to FDA at 1-800-FDA-1088. Where should I keep my medication? This medication is given in a hospital or clinic and will not be stored at home. NOTE: This sheet is a summary. It may not cover all possible information. If you have questions about this medicine, talk to your doctor, pharmacist, or health care provider.  2022 Elsevier/Gold Standard (2020-07-30 12:52:06)  

## 2021-03-19 ENCOUNTER — Other Ambulatory Visit: Payer: Self-pay

## 2021-03-19 ENCOUNTER — Inpatient Hospital Stay (HOSPITAL_BASED_OUTPATIENT_CLINIC_OR_DEPARTMENT_OTHER): Payer: Managed Care, Other (non HMO) | Admitting: Nurse Practitioner

## 2021-03-19 ENCOUNTER — Encounter: Payer: Self-pay | Admitting: Nurse Practitioner

## 2021-03-19 ENCOUNTER — Inpatient Hospital Stay: Payer: Managed Care, Other (non HMO) | Attending: Oncology

## 2021-03-19 VITALS — BP 135/81 | HR 63 | Temp 95.0°F | Resp 17 | Wt 197.0 lb

## 2021-03-19 DIAGNOSIS — Z9884 Bariatric surgery status: Secondary | ICD-10-CM | POA: Diagnosis not present

## 2021-03-19 DIAGNOSIS — E871 Hypo-osmolality and hyponatremia: Secondary | ICD-10-CM | POA: Insufficient documentation

## 2021-03-19 DIAGNOSIS — D508 Other iron deficiency anemias: Secondary | ICD-10-CM | POA: Insufficient documentation

## 2021-03-19 DIAGNOSIS — R634 Abnormal weight loss: Secondary | ICD-10-CM | POA: Insufficient documentation

## 2021-03-19 DIAGNOSIS — Z8042 Family history of malignant neoplasm of prostate: Secondary | ICD-10-CM | POA: Diagnosis not present

## 2021-03-19 DIAGNOSIS — Z809 Family history of malignant neoplasm, unspecified: Secondary | ICD-10-CM

## 2021-03-19 DIAGNOSIS — Z803 Family history of malignant neoplasm of breast: Secondary | ICD-10-CM | POA: Diagnosis not present

## 2021-03-19 DIAGNOSIS — Z8051 Family history of malignant neoplasm of kidney: Secondary | ICD-10-CM | POA: Diagnosis not present

## 2021-03-19 DIAGNOSIS — Z801 Family history of malignant neoplasm of trachea, bronchus and lung: Secondary | ICD-10-CM | POA: Insufficient documentation

## 2021-03-19 DIAGNOSIS — Z800 Family history of malignant neoplasm of digestive organs: Secondary | ICD-10-CM | POA: Insufficient documentation

## 2021-03-19 NOTE — Progress Notes (Signed)
Patient here for oncology follow-up appointment, expresses he lost about 12 lb in a month no diet changes and is concerned

## 2021-03-19 NOTE — Progress Notes (Signed)
Hematology/Oncology Consult Note [facility] Telephone:([phone] [phone]   Patient Care Team: Pcp, No as PCP - General Rickard Patience, MD as Consulting Physician (Hematology and Oncology)  REFERRING PROVIDER: No ref. provider found  CHIEF COMPLAINTS/REASON FOR VISIT:   iron deficiency anemia, B12 deficiency.   HISTORY OF PRESENTING ILLNESS:   Trevor Torres is a  50 y.o.  male with PMH listed below was seen in consultation at the request of  No ref. provider found  for evaluation of iron deficiency anemia  Patient has history of gastric sleeve. 10/01/2020- 10/04/2020 admitted due to near syncope, echo showed LVEF 50-55% with normal diastolic function, no wall motion abnormalities. Hyponatremia, which improved with hydration.  Low B12 level at 60, started on B12 supplementation.  Anemia, microcytic, low iron, started on oral iron supplementation.  Dysphagia, S/p EGD which showed mucosal changes in the duodenum, biopsied & biopsies taken at the gastric body, incisura & gastric antrum. S/p colonoscopy showed non-bleeding internal hemorrhoids.  all biopsies negative for malignancy, dysplasia. Negative H Pylori. 10/16/2020 ED visit for fatigue. Hb slightly improved to 10.9.  10/24/2020 seen [facility]. 11/04/2020 capsule study is negative.   He also has chronic hyponatremia and he puts salt on his food.  Significant family history of cancer- see family history. He smoked socially for a few years in his 45s. Quitted remotely.   INTERVAL HISTORY:  Trevor Torres is a 50 y.o. male with history of iron deficiency anemia and b12 anemia who presents to clinic for concerns of unintentional weight loss.  He says he is lost 12 pounds over the past month although has been increasing calorie intake.  Has associated weakness and fatigue that has not improved with iron infusions.  Denies fevers chills or interval infections.  No easy bleeding or bruising.  No abdominal pain,  constipation, diarrhea, nausea, vomiting.  No urinary complaints.  No melena or hematochezia.  No shortness of breath or chest pain. He's been receiving IV venofer but continues to have low iron levels.   Review of Systems  Constitutional:  Positive for fatigue and unexpected weight change. Negative for appetite change, chills and fever.  HENT:   Negative for hearing loss, mouth sores, sore throat, trouble swallowing and voice change.   Eyes:  Negative for eye problems and icterus.  Respiratory:  Negative for chest tightness, cough and shortness of breath.   Cardiovascular:  Negative for chest pain and leg swelling.  Gastrointestinal:  Negative for abdominal distention, abdominal pain, constipation, diarrhea, nausea and vomiting.  Endocrine: Negative for hot flashes.  Genitourinary:  Negative for bladder incontinence, difficulty urinating, dysuria and frequency.   Musculoskeletal:  Negative for arthralgias, flank pain and neck stiffness.  Skin:  Negative for itching, rash and wound.  Neurological:  Negative for dizziness, headaches, light-headedness and numbness.  Hematological:  Negative for adenopathy. Does not bruise/bleed easily.  Psychiatric/Behavioral:  Negative for confusion, depression and sleep disturbance. The patient is not nervous/anxious.    MEDICAL HISTORY:  Past Medical History:  Diagnosis Date   Anemia    Anxiety    Cellulitis 01/2015   left arm   Family history of breast cancer    Family history of breast cancer    Family history of colon cancer    Family history of kidney cancer    Family history of lung cancer    Family history of prostate cancer    GERD (gastroesophageal reflux disease)    H/O PRIOR TO WEIGHT  LOSS   Headache    HTN (hypertension) 01/07/2015   H/O/ GASTRIC SLEEVE /WT LOSS   Hypothyroidism    not since weight loss   MRSA infection 2014   h/o years ago   PONV (postoperative nausea and vomiting) 01/29/2020   Renal insufficiency    error in  entry   Sleep apnea    H/O PRIOR TO WEIGHT LOSS    SURGICAL HISTORY: Past Surgical History:  Procedure Laterality Date   CHOLECYSTECTOMY N/A 01/14/2018   Procedure: LAPAROSCOPIC CHOLECYSTECTOMY;  Surgeon: Ancil Linsey, MD;  Location: [facility] ORS;  Service: General;  Laterality: N/A;   COLONOSCOPY  2015   COLONOSCOPY N/A 10/03/2020   Procedure: COLONOSCOPY;  Surgeon: Pasty Spillers, MD;  Location: [facility] ENDOSCOPY;  Service: Endoscopy;  Laterality: N/A;   ESOPHAGOGASTRODUODENOSCOPY  2015   ESOPHAGOGASTRODUODENOSCOPY N/A 10/03/2020   Procedure: ESOPHAGOGASTRODUODENOSCOPY (EGD);  Surgeon: Pasty Spillers, MD;  Location: [facility] ENDOSCOPY;  Service: Endoscopy;  Laterality: N/A;   LAPAROSCOPIC GASTRIC SLEEVE RESECTION  2013   SHOULDER ARTHROSCOPY WITH OPEN ROTATOR CUFF REPAIR Right 02/06/2015   Procedure: SHOULDER ARTHROSCOPY WITH OPEN ROTATOR CUFF REPAIR;  Surgeon: Juanell Fairly, MD;  Location: [facility] ORS;  Service: Orthopedics;  Laterality: Right;   XI ROBOTIC ASSISTED VENTRAL HERNIA N/A 01/29/2020   Procedure: XI ROBOTIC ASSISTED INCISIONAL HERNIA REPAIR WITH MESH;  Surgeon: Carolan Shiver, MD;  Location: [facility] ORS;  Service: General;  Laterality: N/A;    SOCIAL HISTORY: Social History   Socioeconomic History   Marital status: Divorced    Spouse name: Not on file   Number of children: Not on file   Years of education: Not on file   Highest education level: Not on file  Occupational History   Not on file  Tobacco Use   Smoking status: Never   Smokeless tobacco: Never  Vaping Use   Vaping Use: Never used  Substance and Sexual Activity   Alcohol use: Not Currently    Comment: 3-4 beers per week   Drug use: No   Sexual activity: Not on file  Other Topics Concern   Not on file  Social History Narrative   Not on file   Social Determinants of Health   Financial Resource Strain: Not on file  Food Insecurity: Not on file  Transportation Needs: Not on file  Physical  Activity: Not on file  Stress: Not on file  Social Connections: Not on file  Intimate Partner Violence: Not on file    FAMILY HISTORY: Family History  Problem Relation Age of Onset   Cancer Mother        Breast Ca   Diabetes Mother    Hypertension Mother    Cancer Father        Lung Ca   Cancer Maternal Aunt        Colon and Kidney Ca   Cancer Maternal Aunt        Breast Ca   Cancer Maternal Grandfather        Lung Ca    ALLERGIES:  has no active allergies.  MEDICATIONS:  Current Outpatient Medications  Medication Sig Dispense Refill   acetaminophen (TYLENOL) 500 MG tablet Take 500-1,000 mg by mouth every 6 (six) hours as needed (for pain.).     Cholecalciferol (VITAMIN D3) 125 MCG (5000 UT) CAPS Take 1 capsule by mouth daily.     Cyanocobalamin (VITAMIN B-12) 1000 MCG SUBL Place 1 tablet (1,000 mcg total) under the tongue daily. 30 tablet 2  ferrous gluconate (FERGON) 324 MG tablet Take 1 tablet (324 mg total) by mouth 2 (two) times daily with a meal. 60 tablet 0   ibuprofen (ADVIL) 600 MG tablet Take 1 tablet (600 mg total) by mouth every 6 (six) hours as needed. 30 tablet 0   Multiple Vitamin (MULTIVITAMIN WITH MINERALS) TABS tablet Take 1 tablet by mouth daily.     traZODone (DESYREL) 50 MG tablet Take 0.5 tablets (25 mg total) by mouth at bedtime as needed for sleep. 15 tablet 0   zinc gluconate 50 MG tablet Take 50 mg by mouth daily.     metaxalone (SKELAXIN) 800 MG tablet Take by mouth. (Patient not taking: No sig reported)     No current facility-administered medications for this visit.   PHYSICAL EXAMINATION: ECOG PERFORMANCE STATUS: 1 - Symptomatic but completely ambulatory Vitals:   03/19/21 0835  BP: 135/81  Pulse: 63  Resp: 17  Temp: (!) 95 F (35 C)  SpO2: 98%   Filed Weights   03/19/21 0835  Weight: 197 lb (89.4 kg)   Physical Exam Vitals reviewed.  Constitutional:      General: He is not in acute distress. HENT:     Head: Normocephalic and  atraumatic.  Eyes:     General: No scleral icterus. Cardiovascular:     Rate and Rhythm: Normal rate and regular rhythm.     Heart sounds: Normal heart sounds.  Pulmonary:     Effort: Pulmonary effort is normal. No respiratory distress.     Breath sounds: No wheezing.  Abdominal:     General: There is no distension.     Palpations: Abdomen is soft.     Tenderness: There is no abdominal tenderness.  Musculoskeletal:        General: No deformity. Normal range of motion.     Cervical back: Normal range of motion and neck supple.  Skin:    General: Skin is warm and dry.     Findings: No erythema or rash.  Neurological:     Mental Status: He is alert and oriented to person, place, and time. Mental status is at baseline.     Cranial Nerves: No cranial nerve deficit.     Coordination: Coordination normal.  Psychiatric:        Mood and Affect: Mood normal.    LABORATORY DATA:  I have reviewed the data as listed Lab Results  Component Value Date   WBC 5.8 02/12/2021   HGB 14.5 02/12/2021   HCT 44.5 02/12/2021   MCV 82.4 02/12/2021   PLT 341 02/12/2021   Recent Labs    10/02/20 0440 10/03/20 0400 10/04/20 0505 10/16/20 2117 02/12/21 1113  NA 130*   < > 132* 131* 129*  K 4.2   < > 4.2 4.1 4.1  CL 99   < > 102 96* 97*  CO2 24   < > 23 24 26   GLUCOSE 84   < > 79 84 55*  BUN 14   < > 11 22* 8  CREATININE 0.87   < > 0.98 1.07 1.09  CALCIUM 8.8*   < > 8.6* 9.2 8.9  GFRNONAA >60   < > >60 >60 >60  PROT 6.7  --   --  6.8 7.3  ALBUMIN 3.8  --   --  4.1 4.4  AST 34  --   --  33 30  ALT 31  --   --  29 27  ALKPHOS 34*  --   --  33* 38  BILITOT 1.0  --   --  1.3* 1.0   < > = values in this interval not displayed.    Iron/TIBC/Ferritin/ %Sat    Component Value Date/Time   IRON 43 (L) 02/12/2021 1108   IRON 94 12/07/2012 0845   TIBC 396 02/12/2021 1108   TIBC 372 12/07/2012 0845   FERRITIN 14 (L) 02/12/2021 1108   FERRITIN 49 12/07/2012 0845   IRONPCTSAT 11 (L)  02/12/2021 1108   IRONPCTSAT 25 12/07/2012 0845     RADIOGRAPHIC STUDIES: I have personally reviewed the radiological images as listed and agreed with the findings in the report. DG Chest 2 View  Result Date: 01/01/2021 CLINICAL DATA:  Hyponatremia EXAM: CHEST - 2 VIEW COMPARISON:  05/23/2020 FINDINGS: Normal heart size, mediastinal contours, and pulmonary vascularity. Lungs clear. No pleural effusion or pneumothorax. Minimal scoliosis. IMPRESSION: No acute abnormalities. Electronically Signed   By: Ulyses Southward M.D.   On: 01/01/2021 10:55       ASSESSMENT & PLAN:  1. Family history of cancer   2. Other iron deficiency anemia    Unintentional Weight loss- 12 lb weight loss in the past 6 weeks despite increasing calorie intake. Additionally, hx of iron deficiency anemia with persistent iron deficiency suspecting ongoing blood loss. He is receiving venofer but given ongoing weight loss I recommend CT chest/abdomen/pelvis to evaluate for masses or lymphadenopathy.   He will follow up with Dr. Cathie Hoops for results and ongoing management.   Consuello Masse, DNP, AGNP-[facility] [phone] (clinic) 03/19/2021

## 2021-03-20 NOTE — Telephone Encounter (Signed)
Per Lauren, wait until CT abd/ pelv is approved to schedule.

## 2021-03-20 NOTE — Telephone Encounter (Signed)
Pt's CT was finally approved. Will you schedule CT and notify pt of appt please.

## 2021-03-24 ENCOUNTER — Telehealth: Payer: Self-pay | Admitting: Oncology

## 2021-03-24 NOTE — Telephone Encounter (Signed)
Spoke with patient to confirm upcoming scans and follow-up with Dr. Cathie Hoops. Patient was agreeable to all.

## 2021-03-25 NOTE — Telephone Encounter (Signed)
Scan approved and scheduled. Pt informed via Mychart.

## 2021-03-25 NOTE — Telephone Encounter (Signed)
Bari Edward, It looks like CT ch/a/p was approved and is already scheduled. Will you just confirm please. Just want to make sure  before I reply back to pt.

## 2021-04-14 ENCOUNTER — Telehealth: Payer: Self-pay | Admitting: Oncology

## 2021-04-14 NOTE — Telephone Encounter (Signed)
Pt called to reschedule appt. Please give a call back at [phone]

## 2021-04-16 ENCOUNTER — Telehealth: Payer: Self-pay | Admitting: Oncology

## 2021-04-16 ENCOUNTER — Ambulatory Visit
Admission: RE | Admit: 2021-04-16 | Discharge: 2021-04-16 | Disposition: A | Discharge status: Home / Self Care | Payer: Managed Care, Other (non HMO) | Source: Ambulatory Visit | Attending: Nurse Practitioner | Admitting: Nurse Practitioner

## 2021-04-16 ENCOUNTER — Other Ambulatory Visit: Payer: Self-pay

## 2021-04-16 DIAGNOSIS — Z809 Family history of malignant neoplasm, unspecified: Secondary | ICD-10-CM | POA: Insufficient documentation

## 2021-04-16 DIAGNOSIS — D508 Other iron deficiency anemias: Secondary | ICD-10-CM | POA: Diagnosis present

## 2021-04-16 MED ORDER — IOHEXOL 300 MG/ML  SOLN
100.0000 mL | Freq: Once | INTRAMUSCULAR | Status: AC | PRN
  Administered 2021-04-16: 100 mL via INTRAVENOUS

## 2021-04-16 NOTE — Telephone Encounter (Signed)
Pt has call again to reschedule his appt. Please give him a call at [phone]

## 2021-04-16 NOTE — Telephone Encounter (Signed)
LVM for pt to call me back should be my direct number

## 2021-04-18 ENCOUNTER — Other Ambulatory Visit: Payer: Managed Care, Other (non HMO)

## 2021-04-18 ENCOUNTER — Other Ambulatory Visit: Payer: Self-pay

## 2021-04-18 ENCOUNTER — Inpatient Hospital Stay: Payer: Managed Care, Other (non HMO) | Attending: Oncology

## 2021-04-18 DIAGNOSIS — E538 Deficiency of other specified B group vitamins: Secondary | ICD-10-CM | POA: Diagnosis present

## 2021-04-18 DIAGNOSIS — D508 Other iron deficiency anemias: Secondary | ICD-10-CM

## 2021-04-18 LAB — CBC WITH DIFFERENTIAL/PLATELET
Abs Immature Granulocytes: 0.02 10*3/uL (ref 0.00–0.07)
Basophils Absolute: 0 10*3/uL (ref 0.0–0.1)
Basophils Relative: 1 %
Eosinophils Absolute: 0.3 10*3/uL (ref 0.0–0.5)
Eosinophils Relative: 8 %
HCT: 41.3 % (ref 39.0–52.0)
Hemoglobin: 14.5 g/dL (ref 13.0–17.0)
Immature Granulocytes: 1 %
Lymphocytes Relative: 23 %
Lymphs Abs: 1 10*3/uL (ref 0.7–4.0)
MCH: 29.9 pg (ref 26.0–34.0)
MCHC: 35.1 g/dL (ref 30.0–36.0)
MCV: 85.2 fL (ref 80.0–100.0)
Monocytes Absolute: 0.5 10*3/uL (ref 0.1–1.0)
Monocytes Relative: 11 %
Neutro Abs: 2.5 10*3/uL (ref 1.7–7.7)
Neutrophils Relative %: 56 %
Platelets: 274 10*3/uL (ref 150–400)
RBC: 4.85 MIL/uL (ref 4.22–5.81)
RDW: 16.8 % — ABNORMAL HIGH (ref 11.5–15.5)
WBC: 4.4 10*3/uL (ref 4.0–10.5)
nRBC: 0 % (ref 0.0–0.2)

## 2021-04-18 LAB — VITAMIN B12: Vitamin B-12: 95 pg/mL — ABNORMAL LOW (ref 180–914)

## 2021-04-18 LAB — IRON AND TIBC
Iron: 148 ug/dL (ref 45–182)
Saturation Ratios: 47 % — ABNORMAL HIGH (ref 17.9–39.5)
TIBC: 314 ug/dL (ref 250–450)
UIBC: 166 ug/dL

## 2021-04-18 LAB — FERRITIN: Ferritin: 90 ng/mL (ref 24–336)

## 2021-04-21 ENCOUNTER — Inpatient Hospital Stay: Payer: Managed Care, Other (non HMO) | Admitting: Oncology

## 2021-04-21 ENCOUNTER — Other Ambulatory Visit: Payer: Managed Care, Other (non HMO)

## 2021-04-21 ENCOUNTER — Inpatient Hospital Stay: Payer: Managed Care, Other (non HMO)

## 2021-04-22 ENCOUNTER — Other Ambulatory Visit: Payer: Self-pay

## 2021-04-22 ENCOUNTER — Encounter: Payer: Self-pay | Admitting: Oncology

## 2021-04-22 ENCOUNTER — Inpatient Hospital Stay: Payer: Managed Care, Other (non HMO)

## 2021-04-22 ENCOUNTER — Inpatient Hospital Stay (HOSPITAL_BASED_OUTPATIENT_CLINIC_OR_DEPARTMENT_OTHER): Payer: Managed Care, Other (non HMO) | Admitting: Oncology

## 2021-04-22 VITALS — BP 126/83 | HR 57 | Temp 96.3°F | Resp 18 | Wt 199.2 lb

## 2021-04-22 DIAGNOSIS — Z789 Other specified health status: Secondary | ICD-10-CM

## 2021-04-22 DIAGNOSIS — E538 Deficiency of other specified B group vitamins: Secondary | ICD-10-CM

## 2021-04-22 DIAGNOSIS — R634 Abnormal weight loss: Secondary | ICD-10-CM | POA: Diagnosis not present

## 2021-04-22 DIAGNOSIS — D508 Other iron deficiency anemias: Secondary | ICD-10-CM

## 2021-04-22 DIAGNOSIS — F1090 Alcohol use, unspecified, uncomplicated: Secondary | ICD-10-CM

## 2021-04-22 MED ORDER — CYANOCOBALAMIN 1000 MCG/ML IJ SOLN: 1000.0000 ug | Freq: Once | INTRAMUSCULAR | 0 refills | Status: AC

## 2021-04-22 MED ORDER — CYANOCOBALAMIN 1000 MCG/ML IJ SOLN
1000.0000 ug | Freq: Once | INTRAMUSCULAR | Status: AC
  Administered 2021-04-22: 1000 ug via INTRAMUSCULAR
  Filled 2021-04-22: qty 1

## 2021-04-22 NOTE — Progress Notes (Signed)
Pt here for follow up. No new concerns voiced.   

## 2021-04-22 NOTE — Patient Instructions (Signed)
Vitamin B12 Deficiency Vitamin B12 deficiency means that your body does not have enough vitamin B12. The body needs this important vitamin: To make red blood cells. To make genes (DNA). To help the nerves work. If you do not have enough vitamin B12 in your body, you can have health problems, such as not having enough red blood cells in the blood (anemia). What are the causes? Not eating enough foods that contain vitamin B12. Not being able to take in (absorb) vitamin B12 from the food that you eat. Certain diseases. A condition in which the body does not make enough of a certain protein. This results in your body not taking in enough vitamin B12. Having a surgery in which part of the stomach or small intestine is taken out. Taking medicines that make it hard for the body to take in vitamin B12. These include: Heartburn medicines. Some medicines that are used to treat diabetes. What increases the risk? Being an older adult. Eating a vegetarian or vegan diet that does not include any foods that come from animals. Not eating enough foods that contain vitamin B12 while you are pregnant. Taking certain medicines. Having alcoholism. What are the signs or symptoms? In some cases, there are no symptoms. If the condition leads to too few blood cells or nerve damage, symptoms can occur, such as: Feeling weak or tired. Not being hungry. Losing feeling (numbness) or tingling in your hands and feet. Redness and burning of the tongue. Feeling sad (depressed). Confusion or memory problems. Trouble walking. If anemia is very bad, symptoms can include: Being short of breath. Being dizzy. Having a very fast heartbeat. How is this treated? Changing the way you eat and drink, such as: Eating more foods that contain vitamin B12. Drinking little or no alcohol. Getting vitamin B12 shots. Taking vitamin B12 supplements by mouth (orally). Your doctor will tell you the dose that is best for you. Follow  these instructions at home: Eating and drinking  Eat foods that come from animals and have a lot of vitamin B12 in them. These include: Meats and poultry. This includes beef, pork, chicken, turkey, and organ meats, such as liver. Seafood, such as clams, rainbow trout, salmon, tuna, and haddock. Eggs. Dairy foods such as milk, yogurt, and cheese. Eat breakfast cereals that have vitamin B12 added to them (are fortified). Check the label. The items listed above may not be a complete list of foods and beverages you can eat and drink. Contact a dietitian for more information. Alcohol use Do not drink alcohol if: Your doctor tells you not to drink. You are pregnant, may be pregnant, or are planning to become pregnant. If you drink alcohol: Limit how much you have to: 0-1 drink a day for women. 0-2 drinks a day for men. Know how much alcohol is in your drink. In the [place] one drink equals one 12 oz bottle of beer (355 mL), one 5 oz glass of wine (148 mL), or one 1 oz glass of hard liquor (44 mL). General instructions Get any vitamin B12 shots if told by your doctor. Take supplements only as told by your doctor. Follow the directions. Keep all follow-up visits. Contact a doctor if: Your symptoms come back. Your symptoms get worse or do not get better with treatment. Get help right away if: You have trouble breathing. You have a very fast heartbeat. You have chest pain. You get dizzy. You faint. These symptoms may be an emergency. Get help right away. Call 911.   Do not wait to see if the symptoms will go away. Do not drive yourself to the hospital. Summary Vitamin B12 deficiency means that your body is not getting enough of the vitamin. In some cases, there are no symptoms of this condition. Treatment may include making a change in the way you eat and drink, getting shots, or taking supplements. Eat foods that have vitamin B12 in them. This information is not intended to replace advice  given to you by your health care provider. Make sure you discuss any questions you have with your health care provider. Document Revised: 12/27/2020 Document Reviewed: 12/27/2020 Elsevier Patient Education  2022 [employer]  

## 2021-04-22 NOTE — Progress Notes (Signed)
Hematology/Oncology progress note  [phone] [phone]   Patient Care Team: Pcp, No as PCP - General Earlie Server, MD as Consulting Physician (Hematology and Oncology)  REFERRING PROVIDER: No ref. provider found  CHIEF COMPLAINTS/REASON FOR VISIT:   iron deficiency anemia, B12 deficiency.   HISTORY OF PRESENTING ILLNESS:   Trevor Torres is a  50 y.o.  male with PMH listed below was seen in consultation at the request of  No ref. provider found  for evaluation of iron deficiency anemia  Patient has history of gastric sleeve. 10/01/2020- 10/04/2020 admitted due to near syncope, echo showed LVEF 50-55% with normal diastolic function, no wall motion abnormalities. Hyponatremia, which improved with hydration.  Low B12 level at 60, started on B12 supplementation.  Anemia, microcytic, low iron, started on oral iron supplementation.  Dysphagia, S/p EGD which showed mucosal changes in the duodenum, biopsied & biopsies taken at the gastric body, incisura & gastric antrum. S/p colonoscopy showed non-bleeding internal hemorrhoids.  all biopsies negative for malignancy, dysplasia. Negative H Pylori. 10/16/2020 ED visit for fatigue. Hb slightly improved to 10.9.  10/24/2020 seen [facility]. 11/04/2020 capsule study is negative.   He also has chronic hyponatremia and he puts salt on his food.  Significant family history of cancer- see family history. He smoked socially for a few years in his 17s. Quitted remotely.   INTERVAL HISTORY Trevor Torres is a 50 y.o. male who has above history reviewed by me today presents for follow up visit for management of  iron deficiency anemia, B12 deficiency.  Patient has had daily vitamin B12 injections followed by weekly and currently on monthly B12 injections schedule. Patient was seen by nurse practitioner in November 2022.  He continues to have unintentional weight loss.  CT was obtained for further evaluation.  04/17/2021, CT chest abdomen  pelvis with contrast showed coronary artery calcification and aortic atherosclerosis.  No lymphadenopathy.  No acute findings in abdomen and pelvis.  No new adenopathy.  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  Post cholecystectomy. Patient drinks alcohol.  He is trying to cut down alcohol consumption.  Review of Systems  Constitutional:  Positive for fatigue. Negative for appetite change, chills, fever and unexpected weight change.  HENT:   Negative for hearing loss and voice change.   Eyes:  Negative for eye problems and icterus.  Respiratory:  Negative for chest tightness, cough and shortness of breath.   Cardiovascular:  Negative for chest pain and leg swelling.  Gastrointestinal:  Negative for abdominal distention and abdominal pain.  Endocrine: Negative for hot flashes.  Genitourinary:  Negative for difficulty urinating, dysuria and frequency.   Musculoskeletal:  Negative for arthralgias.  Skin:  Negative for itching and rash.  Neurological:  Negative for light-headedness and numbness.  Hematological:  Negative for adenopathy. Does not bruise/bleed easily.  Psychiatric/Behavioral:  Negative for confusion.    MEDICAL HISTORY:  Past Medical History:  Diagnosis Date   Anemia    Anxiety    Cellulitis 01/2015   left arm   Family history of breast cancer    Family history of breast cancer    Family history of colon cancer    Family history of kidney cancer    Family history of lung cancer    Family history of prostate cancer    GERD (gastroesophageal reflux disease)    H/O PRIOR TO WEIGHT LOSS   Headache    HTN (hypertension) 01/07/2015   H/O/ GASTRIC SLEEVE /WT LOSS  Hypothyroidism    not since weight loss   MRSA infection 2014   h/o years ago   PONV (postoperative nausea and vomiting) 01/29/2020   Renal insufficiency    error in entry   Sleep apnea    H/O PRIOR TO WEIGHT LOSS    SURGICAL HISTORY: Past Surgical History:  Procedure Laterality Date    CHOLECYSTECTOMY N/A 01/14/2018   Procedure: LAPAROSCOPIC CHOLECYSTECTOMY;  Surgeon: Vickie Epley, MD;  Location: [facility];  Service: General;  Laterality: N/A;   COLONOSCOPY  2015   COLONOSCOPY N/A 10/03/2020   Procedure: COLONOSCOPY;  Surgeon: Virgel Manifold, MD;  Location: [facility];  Service: Endoscopy;  Laterality: N/A;   ESOPHAGOGASTRODUODENOSCOPY  2015   ESOPHAGOGASTRODUODENOSCOPY N/A 10/03/2020   Procedure: ESOPHAGOGASTRODUODENOSCOPY (EGD);  Surgeon: Virgel Manifold, MD;  Location: [facility] END[facility]COPY;  Service: Endoscopy;  Laterality: N/A;   LAPAROSCOPIC GASTRIC SLEEVE RESECTION  2013   SHOULDER ARTHROSCOPY WITH OPEN ROTATOR CUFF REPAIR Right 02/06/2015   Procedure: SHOULDER ARTHROSCOPY WITH OPEN ROTATOR CUFF REPAIR;  Surgeon: Thornton Park, MD;  Location: [facility]S;  Service: Orthopedics;  Laterality: Right;   XI ROBOTIC ASSISTED VENTRAL HERNIA N/A 01/29/2020   Procedure: XI ROBOTIC ASSISTED INCISIONAL HERNIA REPAIR WITH MESH;  Surgeon: Herbert Pun, MD;  Location: [facility];  Service: General;  Laterality: N/A;    SOCIAL HISTORY: Social History   Socioeconomic History   Marital status: Divorced    Spouse name: Not on file   Number of children: Not on file   Years of education: Not on file   Highest education level: Not on file  Occupational History   Not on file  Tobacco Use   Smoking status: Never   Smokeless tobacco: Never  Vaping Use   Vaping Use: Never used  Substance and Sexual Activity   Alcohol use: Not Currently    Comment: 3-4 beers per week   Drug use: No   Sexual activity: Not on file  Other Topics Concern   Not on file  Social History Narrative   Not on file   Social Determinants of Health   Financial Resource Strain: Not on file  Food Insecurity: Not on file  Transportation Needs: Not on file  Physical Activity: Not on file  Stress: Not on file  Social Connections: Not on file  Intimate Partner Violence: Not on file     FAMILY HISTORY: Family History  Problem Relation Age of Onset   Cancer Mother        Breast Ca   Diabetes Mother    Hypertension Mother    Cancer Father        Lung Ca   Cancer Maternal Aunt        Colon and Kidney Ca   Cancer Maternal Aunt        Breast Ca   Cancer Maternal Grandfather        Lung Ca    ALLERGIES:  has no active allergies.  MEDICATIONS:  Current Outpatient Medications  Medication Sig Dispense Refill   acetaminophen (TYLENOL) 500 MG tablet Take 500-1,000 mg by mouth every 6 (six) hours as needed (for pain.).     Cholecalciferol (VITAMIN D3) 125 MCG (5000 UT) CAPS Take 1 capsule by mouth daily.     cyanocobalamin (,VITAMIN B-12,) 1000 MCG/ML injection Inject 1 mL (1,000 mcg total) into the muscle once for 1 dose. B12 injection daily for 5 days, followed by weekly for 4 weeks, monthly. 12 mL 0   Cyanocobalamin (VITAMIN B-12)  1000 MCG SUBL Place 1 tablet (1,000 mcg total) under the tongue daily. 30 tablet 2   ferrous gluconate (FERGON) 324 MG tablet Take 1 tablet (324 mg total) by mouth 2 (two) times daily with a meal. 60 tablet 0   ibuprofen (ADVIL) 600 MG tablet Take 1 tablet (600 mg total) by mouth every 6 (six) hours as needed. 30 tablet 0   Multiple Vitamin (MULTIVITAMIN WITH MINERALS) TABS tablet Take 1 tablet by mouth daily.     traZODone (DESYREL) 50 MG tablet Take 0.5 tablets (25 mg total) by mouth at bedtime as needed for sleep. 15 tablet 0   zinc gluconate 50 MG tablet Take 50 mg by mouth daily.     No current facility-administered medications for this visit.     PHYSICAL EXAMINATION: ECOG PERFORMANCE STATUS: 1 - Symptomatic but completely ambulatory Vitals:   04/22/21 1337  BP: 126/83  Pulse: (!) 57  Resp: 18  Temp: (!) 96.3 F (35.7 C)   Filed Weights   04/22/21 1337  Weight: 199 lb 3.2 oz (90.4 kg)    Physical Exam Constitutional:      General: He is not in acute distress. HENT:     Head: Normocephalic and atraumatic.  Eyes:      General: No scleral icterus. Cardiovascular:     Rate and Rhythm: Normal rate and regular rhythm.     Heart sounds: Normal heart sounds.  Pulmonary:     Effort: Pulmonary effort is normal. No respiratory distress.     Breath sounds: No wheezing.  Abdominal:     General: Bowel sounds are normal. There is no distension.     Palpations: Abdomen is soft.  Musculoskeletal:        General: No deformity. Normal range of motion.     Cervical back: Normal range of motion and neck supple.  Skin:    General: Skin is warm and dry.     Findings: No erythema or rash.  Neurological:     Mental Status: He is alert and oriented to person, place, and time. Mental status is at baseline.     Cranial Nerves: No cranial nerve deficit.     Coordination: Coordination normal.  Psychiatric:        Mood and Affect: Mood normal.    LABORATORY DATA:  I have reviewed the data as listed Lab Results  Component Value Date   WBC 4.4 04/18/2021   HGB 14.5 04/18/2021   HCT 41.3 04/18/2021   MCV 85.2 04/18/2021   PLT 274 04/18/2021   Recent Labs    10/02/20 0440 10/03/20 0400 10/04/20 0505 10/16/20 2117 02/12/21 1113  NA 130*   < > 132* 131* 129*  K 4.2   < > 4.2 4.1 4.1  CL 99   < > 102 96* 97*  CO2 24   < > 23 24 26   GLUCOSE 84   < > 79 84 55*  BUN 14   < > 11 22* 8  CREATININE 0.87   < > 0.98 1.07 1.09  CALCIUM 8.8*   < > 8.6* 9.2 8.9  GFRNONAA >60   < > >60 >60 >60  PROT 6.7  --   --  6.8 7.3  ALBUMIN 3.8  --   --  4.1 4.4  AST 34  --   --  33 30  ALT 31  --   --  29 27  ALKPHOS 34*  --   --  33* 38  BILITOT  1.0  --   --  1.3* 1.0   < > = values in this interval not displayed.    Iron/TIBC/Ferritin/ %Sat    Component Value Date/Time   IRON 148 04/18/2021 0810   IRON 94 12/07/2012 0845   TIBC 314 04/18/2021 0810   TIBC 372 12/07/2012 0845   FERRITIN 90 04/18/2021 0810   FERRITIN 49 12/07/2012 0845   IRONPCTSAT 47 (H) 04/18/2021 0810   IRONPCTSAT 25 12/07/2012 0845        RADIOGRAPHIC STUDIES: I have personally reviewed the radiological images as listed and agreed with the findings in the report. CT CHEST ABDOMEN PELVIS W CONTRAST  Result Date: 04/17/2021 CLINICAL DATA:  Unexplained weight loss of 50 year old male. Iron deficiency anemia. EXAM: CT CHEST, ABDOMEN, AND PELVIS WITH CONTRAST TECHNIQUE: Multidetector CT imaging of the chest, abdomen and pelvis was performed following the standard protocol during bolus administration of intravenous contrast. CONTRAST:  144mL OMNIPAQUE IOHEXOL 300 MG/ML  SOLN COMPARISON:  CT 10/01/2020 FINDINGS: CT CHEST FINDINGS Cardiovascular: Coronary artery calcification and aortic atherosclerotic calcification. No significant vascular findings. Normal heart size. No pericardial effusion. Mediastinum/Nodes: No axillary or supraclavicular adenopathy. No mediastinal or hilar adenopathy. No pericardial fluid. Esophagus normal. Lungs/Pleura: No suspicious pulmonary nodules. Normal pleural. Airways normal. Musculoskeletal: No aggressive osseous lesion. CT ABDOMEN AND PELVIS FINDINGS Hepatobiliary: No focal hepatic lesion. Postcholecystectomy. No biliary dilatation. Pancreas: Pancreas is normal. No ductal dilatation. No pancreatic inflammation. Spleen: Normal Adrenals/urinary tract: Adrenal glands and kidneys are normal. The ureters and bladder normal. Stomach/Bowel: Post gastric sleeve bariatric surgery anatomy. No complication. Duodenum and small-bowel normal. Contrast reaches the distal ileum . Appendix not identified. The colon and rectosigmoid colon are normal. Vascular/Lymphatic: Abdominal aorta is normal caliber. There is no retroperitoneal or periportal lymphadenopathy. No pelvic lymphadenopathy. Reproductive: Prostate unremarkable. Other: No free fluid. Musculoskeletal: No aggressive osseous lesion. IMPRESSION: Chest Impression: 1. Coronary artery calcification and Aortic Atherosclerosis (ICD10-I70.0). 2. No lymphadenopathy.  Normal  pulmonary Abdomen / Pelvis Impression: 1. No acute findings in the abdomen pelvis. 2. No lymphadenopathy. 3. Postcholecystectomy. 4. 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 Electronically Signed   By: Suzy Bouchard M.D.   On: 04/17/2021 15:37       ASSESSMENT & PLAN:  1. Vitamin B12 deficiency   2. Weight loss   3. Other iron deficiency anemia   4. Alcohol use    # Iron deficiency anemia.  Status post IV Venofer treatments. Labs are reviewed and discussed with patient. Hemoglobin has been stable and normal.  14.5.  Iron panel has improved.  I will hold off additional IV Venofer treatments at this point.  # Vitamin B12 deficiency, positive intrinsive factor antibody. - pernicious anemia.  Proceed with vitamin B12 level today.  B12 has improved however now decreased again. Recommend B12 injection 1000MCG daily x5 followed by weekly x4 followed by monthly.  Patient prefers to have prescription and ask RN at work to administrate.  Prescription was sent to pharmacy.  Advised patient also to utilize sublingual vitamin B12 supplementation when he is on monthly B12 schedule.   # Chronic hyponatremia. Normal morning cortisol, serum osm 275, urine osm decreased at 277.  likely hyponatremia is not ADH dependant.  Probably secondary to chronic alcohol use.  Alcohol cessation was discussed with patient.   #Weight loss, CT shows no radiographic evidence of cancer.  CT results were reviewed with patient. I think this is most likely secondary to his bariatric surgery.  I will hold off additional  work-up at this point.  Coronary artery calcification and aortic atherosclerosis.  Discussed with patient.  I recommend patient to further discuss with primary care provider for the findings.  Family history of cancer. Genetic testing is negative.    All questions were answered. The patient knows to call the clinic with any problems questions or concerns.  Return of visit: 3 months  labs cbc iron tibc ferritin, B12 level/ B12 injection -MD + venofer +/- B12 injection   Earlie Server, MD, PhD 04/22/2021

## 2021-04-23 ENCOUNTER — Ambulatory Visit: Payer: Managed Care, Other (non HMO)

## 2021-04-23 ENCOUNTER — Other Ambulatory Visit: Payer: Managed Care, Other (non HMO)

## 2021-04-23 ENCOUNTER — Ambulatory Visit: Payer: Managed Care, Other (non HMO) | Admitting: Oncology

## 2021-05-21 ENCOUNTER — Inpatient Hospital Stay: Payer: Managed Care, Other (non HMO) | Attending: Oncology

## 2021-06-17 ENCOUNTER — Telehealth: Payer: Self-pay | Admitting: Oncology

## 2021-06-17 NOTE — Telephone Encounter (Signed)
Pt called to reschedule his appt for 2-1. Call back at [phone]

## 2021-06-18 ENCOUNTER — Inpatient Hospital Stay: Payer: Managed Care, Other (non HMO)

## 2021-07-08 ENCOUNTER — Other Ambulatory Visit: Payer: Self-pay | Admitting: *Deleted

## 2021-07-08 DIAGNOSIS — D508 Other iron deficiency anemias: Secondary | ICD-10-CM

## 2021-07-14 ENCOUNTER — Other Ambulatory Visit: Payer: Self-pay

## 2021-07-14 ENCOUNTER — Inpatient Hospital Stay: Payer: Managed Care, Other (non HMO) | Attending: Oncology

## 2021-07-14 DIAGNOSIS — D508 Other iron deficiency anemias: Secondary | ICD-10-CM | POA: Diagnosis not present

## 2021-07-14 DIAGNOSIS — R634 Abnormal weight loss: Secondary | ICD-10-CM

## 2021-07-14 LAB — CBC WITH DIFFERENTIAL/PLATELET
Abs Immature Granulocytes: 0.02 10*3/uL (ref 0.00–0.07)
Basophils Absolute: 0.1 10*3/uL (ref 0.0–0.1)
Basophils Relative: 1 %
Eosinophils Absolute: 0.3 10*3/uL (ref 0.0–0.5)
Eosinophils Relative: 6 %
HCT: 42.3 % (ref 39.0–52.0)
Hemoglobin: 14.6 g/dL (ref 13.0–17.0)
Immature Granulocytes: 0 %
Lymphocytes Relative: 25 %
Lymphs Abs: 1.3 10*3/uL (ref 0.7–4.0)
MCH: 32.8 pg (ref 26.0–34.0)
MCHC: 34.5 g/dL (ref 30.0–36.0)
MCV: 95.1 fL (ref 80.0–100.0)
Monocytes Absolute: 0.5 10*3/uL (ref 0.1–1.0)
Monocytes Relative: 11 %
Neutro Abs: 2.9 10*3/uL (ref 1.7–7.7)
Neutrophils Relative %: 57 %
Platelets: 272 10*3/uL (ref 150–400)
RBC: 4.45 MIL/uL (ref 4.22–5.81)
RDW: 13.2 % (ref 11.5–15.5)
WBC: 5 10*3/uL (ref 4.0–10.5)
nRBC: 0 % (ref 0.0–0.2)

## 2021-07-14 LAB — VITAMIN B12: Vitamin B-12: 177 pg/mL — ABNORMAL LOW (ref 180–914)

## 2021-07-16 ENCOUNTER — Other Ambulatory Visit: Payer: Self-pay

## 2021-07-16 ENCOUNTER — Encounter: Payer: Self-pay | Admitting: Oncology

## 2021-07-16 ENCOUNTER — Ambulatory Visit: Payer: Managed Care, Other (non HMO)

## 2021-07-16 ENCOUNTER — Inpatient Hospital Stay: Payer: Managed Care, Other (non HMO)

## 2021-07-16 ENCOUNTER — Inpatient Hospital Stay: Payer: Managed Care, Other (non HMO) | Attending: Oncology | Admitting: Oncology

## 2021-07-16 VITALS — BP 136/92 | HR 62 | Temp 96.4°F | Resp 18 | Wt 194.0 lb

## 2021-07-16 DIAGNOSIS — E538 Deficiency of other specified B group vitamins: Secondary | ICD-10-CM | POA: Diagnosis not present

## 2021-07-16 DIAGNOSIS — D508 Other iron deficiency anemias: Secondary | ICD-10-CM

## 2021-07-16 DIAGNOSIS — Z789 Other specified health status: Secondary | ICD-10-CM

## 2021-07-16 MED ORDER — CYANOCOBALAMIN 1000 MCG/ML IJ SOLN
1000.0000 ug | Freq: Once | INTRAMUSCULAR | Status: AC
  Administered 2021-07-16: 1000 ug via INTRAMUSCULAR
  Filled 2021-07-16: qty 1

## 2021-07-16 MED ORDER — VITAMIN B-12 1000 MCG SL SUBL: 1000.0000 ug | SUBLINGUAL_TABLET | Freq: Every day | SUBLINGUAL | 2 refills | Status: DC

## 2021-07-16 NOTE — Progress Notes (Signed)
Pt here for follow up. No new concerns voiced.   

## 2021-07-16 NOTE — Progress Notes (Signed)
Hematology/Oncology Progress note [phone] [phone]      Patient Care Team: Pcp, No as PCP - General Rickard PatienceYu, Daran Favaro, MD as Consulting Physician (Hematology and Oncology)  REFERRING PROVIDER: Rickard PatienceYu, Shemuel Harkleroad, MD  CHIEF COMPLAINTS/REASON FOR VISIT:   iron deficiency anemia, B12 deficiency.   HISTORY OF PRESENTING ILLNESS:   Trevor Torres is a  51 y.o.  male with PMH listed below was seen in consultation at the request of  Rickard PatienceYu, Taniqua Issa, MD  for evaluation of iron deficiency anemia  Patient has history of gastric sleeve. 10/01/2020- 10/04/2020 admitted due to near syncope, echo showed LVEF 50-55% with normal diastolic function, no wall motion abnormalities. Hyponatremia, which improved with hydration.  Low B12 level at 60, started on B12 supplementation.  Anemia, microcytic, low iron, started on oral iron supplementation.  Dysphagia, S/p EGD which showed mucosal changes in the duodenum, biopsied & biopsies taken at the gastric body, incisura & gastric antrum. S/p colonoscopy showed non-bleeding internal hemorrhoids.  all biopsies negative for malignancy, dysplasia. Negative H Pylori. 10/16/2020 ED visit for fatigue. Hb slightly improved to 10.9.  10/24/2020 seen [facility]. 11/04/2020 capsule study is negative.   He also has chronic hyponatremia and he puts salt on his food.  Significant family history of cancer- see family history. He smoked socially for a few years in his 6320s. Quitted remotely.   Patient has had daily vitamin B12 injections followed by weekly and currently on monthly B12 injections schedule. Patient was seen by nurse practitioner in November 2022.  He continues to have unintentional weight loss.  CT was obtained for further evaluation.  04/17/2021, CT chest abdomen pelvis with contrast showed coronary artery calcification and aortic atherosclerosis.  No lymphadenopathy.  No acute findings in abdomen and pelvis.  No new adenopathy.  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  Post cholecystectomy.   Family history of cancer. Genetic testing is negative.  INTERVAL HISTORY Trevor Torres is a 51 y.o. male who has above history reviewed by me today presents for follow up visit for management of  iron deficiency anemia, B12 deficiency.  Patient reports that he has cut down alcohol consumption. He has been eating healthy and has lost 5 pounds intentionally since last visit. Review of Systems  Constitutional:  Positive for fatigue. Negative for appetite change, chills, fever and unexpected weight change.  HENT:   Negative for hearing loss and voice change.   Eyes:  Negative for eye problems and icterus.  Respiratory:  Negative for chest tightness, cough and shortness of breath.   Cardiovascular:  Negative for chest pain and leg swelling.  Gastrointestinal:  Negative for abdominal distention and abdominal pain.  Endocrine: Negative for hot flashes.  Genitourinary:  Negative for difficulty urinating, dysuria and frequency.   Musculoskeletal:  Negative for arthralgias.  Skin:  Negative for itching and rash.  Neurological:  Negative for light-headedness and numbness.  Hematological:  Negative for adenopathy. Does not bruise/bleed easily.  Psychiatric/Behavioral:  Negative for confusion.    MEDICAL HISTORY:  Past Medical History:  Diagnosis Date   Anemia    Anxiety    Cellulitis 01/2015   left arm   Family history of breast cancer    Family history of breast cancer    Family history of colon cancer    Family history of kidney cancer    Family history of lung cancer    Family history of prostate cancer    GERD (gastroesophageal reflux disease)    H/O  PRIOR TO WEIGHT LOSS   Headache    HTN (hypertension) 01/07/2015   H/O/ GASTRIC SLEEVE /WT LOSS   Hypothyroidism    not since weight loss   MRSA infection 2014   h/o years ago   PONV (postoperative nausea and vomiting) 01/29/2020   Renal insufficiency    error in entry    Sleep apnea    H/O PRIOR TO WEIGHT LOSS    SURGICAL HISTORY: Past Surgical History:  Procedure Laterality Date   CHOLECYSTECTOMY N/A 01/14/2018   Procedure: LAPAROSCOPIC CHOLECYSTECTOMY;  Surgeon: Ancil Linsey, MD;  Location: [facility] ORS;  Service: General;  Laterality: N/A;   COLONOSCOPY  2015   COLONOSCOPY N/A 10/03/2020   Procedure: COLONOSCOPY;  Surgeon: Pasty Spillers, MD;  Location: [facility] ENDOSCOPY;  Service: Endoscopy;  Laterality: N/A;   ESOPHAGOGASTRODUODENOSCOPY  2015   ESOPHAGOGASTRODUODENOSCOPY N/A 10/03/2020   Procedure: ESOPHAGOGASTRODUODENOSCOPY (EGD);  Surgeon: Pasty Spillers, MD;  Location: [facility] ENDOSCOPY;  Service: Endoscopy;  Laterality: N/A;   LAPAROSCOPIC GASTRIC SLEEVE RESECTION  2013   SHOULDER ARTHROSCOPY WITH OPEN ROTATOR CUFF REPAIR Right 02/06/2015   Procedure: SHOULDER ARTHROSCOPY WITH OPEN ROTATOR CUFF REPAIR;  Surgeon: Juanell Fairly, MD;  Location: [facility] ORS;  Service: Orthopedics;  Laterality: Right;   XI ROBOTIC ASSISTED VENTRAL HERNIA N/A 01/29/2020   Procedure: XI ROBOTIC ASSISTED INCISIONAL HERNIA REPAIR WITH MESH;  Surgeon: Carolan Shiver, MD;  Location: [facility]S;  Service: General;  Laterality: N/A;    SOCIAL HISTORY: Social History   Socioeconomic History   Marital status: Divorced    Spouse name: Not on file   Number of children: Not on file   Years of education: Not on file   Highest education level: Not on file  Occupational History   Not on file  Tobacco Use   Smoking status: Never   Smokeless tobacco: Never  Vaping Use   Vaping Use: Never used  Substance and Sexual Activity   Alcohol use: Yes    Comment: 3-4 beers per week   Drug use: No   Sexual activity: Not on file  Other Topics Concern   Not on file  Social History Narrative   Not on file   Social Determinants of Health   Financial Resource Strain: Not on file  Food Insecurity: Not on file  Transportation Needs: Not on file  Physical Activity: Not on  file  Stress: Not on file  Social Connections: Not on file  Intimate Partner Violence: Not on file    FAMILY HISTORY: Family History  Problem Relation Age of Onset   Cancer Mother        Breast Ca   Diabetes Mother    Hypertension Mother    Cancer Father        Lung Ca   Cancer Maternal Aunt        Colon and Kidney Ca   Cancer Maternal Aunt        Breast Ca   Cancer Maternal Grandfather        Lung Ca    ALLERGIES:  has no active allergies.  MEDICATIONS:  Current Outpatient Medications  Medication Sig Dispense Refill   acetaminophen (TYLENOL) 500 MG tablet Take 500-1,000 mg by mouth every 6 (six) hours as needed (for pain.).     Cholecalciferol (VITAMIN D3) 125 MCG (5000 UT) CAPS Take 1 capsule by mouth daily.     ibuprofen (ADVIL) 600 MG tablet Take 1 tablet (600 mg total) by mouth every 6 (six) hours as needed.  30 tablet 0   Multiple Vitamin (MULTIVITAMIN WITH MINERALS) TABS tablet Take 1 tablet by mouth daily.     zinc gluconate 50 MG tablet Take 50 mg by mouth daily.     Cyanocobalamin (VITAMIN B-12) 1000 MCG SUBL Place 1 tablet (1,000 mcg total) under the tongue daily. 90 tablet 2   ferrous gluconate (FERGON) 324 MG tablet Take 1 tablet (324 mg total) by mouth 2 (two) times daily with a meal. 60 tablet 0   traZODone (DESYREL) 50 MG tablet Take 0.5 tablets (25 mg total) by mouth at bedtime as needed for sleep. 15 tablet 0   No current facility-administered medications for this visit.     PHYSICAL EXAMINATION: ECOG PERFORMANCE STATUS: 1 - Symptomatic but completely ambulatory Vitals:   07/16/21 1412  BP: (!) 136/92  Pulse: 62  Resp: 18  Temp: (!) 96.4 F (35.8 C)   Filed Weights   07/16/21 1412  Weight: 194 lb (88 kg)    Physical Exam Constitutional:      General: He is not in acute distress. HENT:     Head: Normocephalic and atraumatic.  Eyes:     General: No scleral icterus. Cardiovascular:     Rate and Rhythm: Normal rate and regular rhythm.      Heart sounds: Normal heart sounds.  Pulmonary:     Effort: Pulmonary effort is normal. No respiratory distress.     Breath sounds: No wheezing.  Abdominal:     General: Bowel sounds are normal. There is no distension.     Palpations: Abdomen is soft.  Musculoskeletal:        General: No deformity. Normal range of motion.     Cervical back: Normal range of motion and neck supple.  Skin:    General: Skin is warm and dry.     Findings: No erythema or rash.  Neurological:     Mental Status: He is alert and oriented to person, place, and time. Mental status is at baseline.     Cranial Nerves: No cranial nerve deficit.     Coordination: Coordination normal.  Psychiatric:        Mood and Affect: Mood normal.    LABORATORY DATA:  I have reviewed the data as listed Lab Results  Component Value Date   WBC 5.0 07/14/2021   HGB 14.6 07/14/2021   HCT 42.3 07/14/2021   MCV 95.1 07/14/2021   PLT 272 07/14/2021   Recent Labs    10/02/20 0440 10/03/20 0400 10/04/20 0505 10/16/20 2117 02/12/21 1113  NA 130*   < > 132* 131* 129*  K 4.2   < > 4.2 4.1 4.1  CL 99   < > 102 96* 97*  CO2 24   < > 23 24 26   GLUCOSE 84   < > 79 84 55*  BUN 14   < > 11 22* 8  CREATININE 0.87   < > 0.98 1.07 1.09  CALCIUM 8.8*   < > 8.6* 9.2 8.9  GFRNONAA >60   < > >60 >60 >60  PROT 6.7  --   --  6.8 7.3  ALBUMIN 3.8  --   --  4.1 4.4  AST 34  --   --  33 30  ALT 31  --   --  29 27  ALKPHOS 34*  --   --  33* 38  BILITOT 1.0  --   --  1.3* 1.0   < > = values in this interval  not displayed.    Iron/TIBC/Ferritin/ %Sat    Component Value Date/Time   IRON 148 04/18/2021 0810   IRON 94 12/07/2012 0845   TIBC 314 04/18/2021 0810   TIBC 372 12/07/2012 0845   FERRITIN 90 04/18/2021 0810   FERRITIN 49 12/07/2012 0845   IRONPCTSAT 47 (H) 04/18/2021 0810   IRONPCTSAT 25 12/07/2012 0845       RADIOGRAPHIC STUDIES: I have personally reviewed the radiological images as listed and agreed with the  findings in the report. No results found.     ASSESSMENT & PLAN:  1. Other iron deficiency anemia   2. Vitamin B12 deficiency   3. Alcohol use    # Iron deficiency anemia.  Status post IV Venofer treatments. Iron panel was checked however results are pending.  Communicated with lab, the samples were not processed appropriately.  We will arrange patient to repeat iron panel.  # Vitamin B12 deficiency, positive intrinsive factor antibody. - pernicious anemia.  He has been on vitamin B12 injection monthly-given by RN at work. Proceed with IM vitamin B12 today.  I will hold off additional IM vitamin B12 for now. Continue sublingual vitamin B12 1000 mcg daily.  # Chronic hyponatremia. Normal morning cortisol, serum osm 275, urine osm decreased at 277.  likely hyponatremia is not ADH dependant.  Probably secondary to chronic alcohol use.  Alcohol cessation was discussed with patient.   #Weight loss, CT shows no radiographic evidence of cancer.  CT results were reviewed with patient. I think this is most likely secondary to his bariatric surgery.  I will hold off additional work-up at this point.     All questions were answered. The patient knows to call the clinic with any problems questions or concerns.  Return of visit: 4 months labs cbc iron tibc ferritin, B12 level/ B12 injection -MD + venofer +/- B12 injection   Rickard Patience, MD, PhD 07/16/2021

## 2021-07-18 LAB — IRON AND TIBC
Iron: 121 ug/dL (ref 45–182)
Saturation Ratios: 39 % (ref 17.9–39.5)
TIBC: 311 ug/dL (ref 250–450)
UIBC: 190 ug/dL

## 2021-07-18 LAB — FERRITIN: Ferritin: 70 ng/mL (ref 24–336)

## 2021-08-20 ENCOUNTER — Inpatient Hospital Stay: Payer: Managed Care, Other (non HMO) | Attending: Oncology

## 2021-09-17 IMAGING — CR DG CHEST 2V
1 series · 2 of 2 positions shown · non-contrast
Comparison: 05/23/2020

CLINICAL DATA: Hyponatremia

EXAM:
CHEST - 2 VIEW

[Series 1: dg chest 2 view · 0.14mm/px · 2 of 2 slices shown]
[im 1/2]
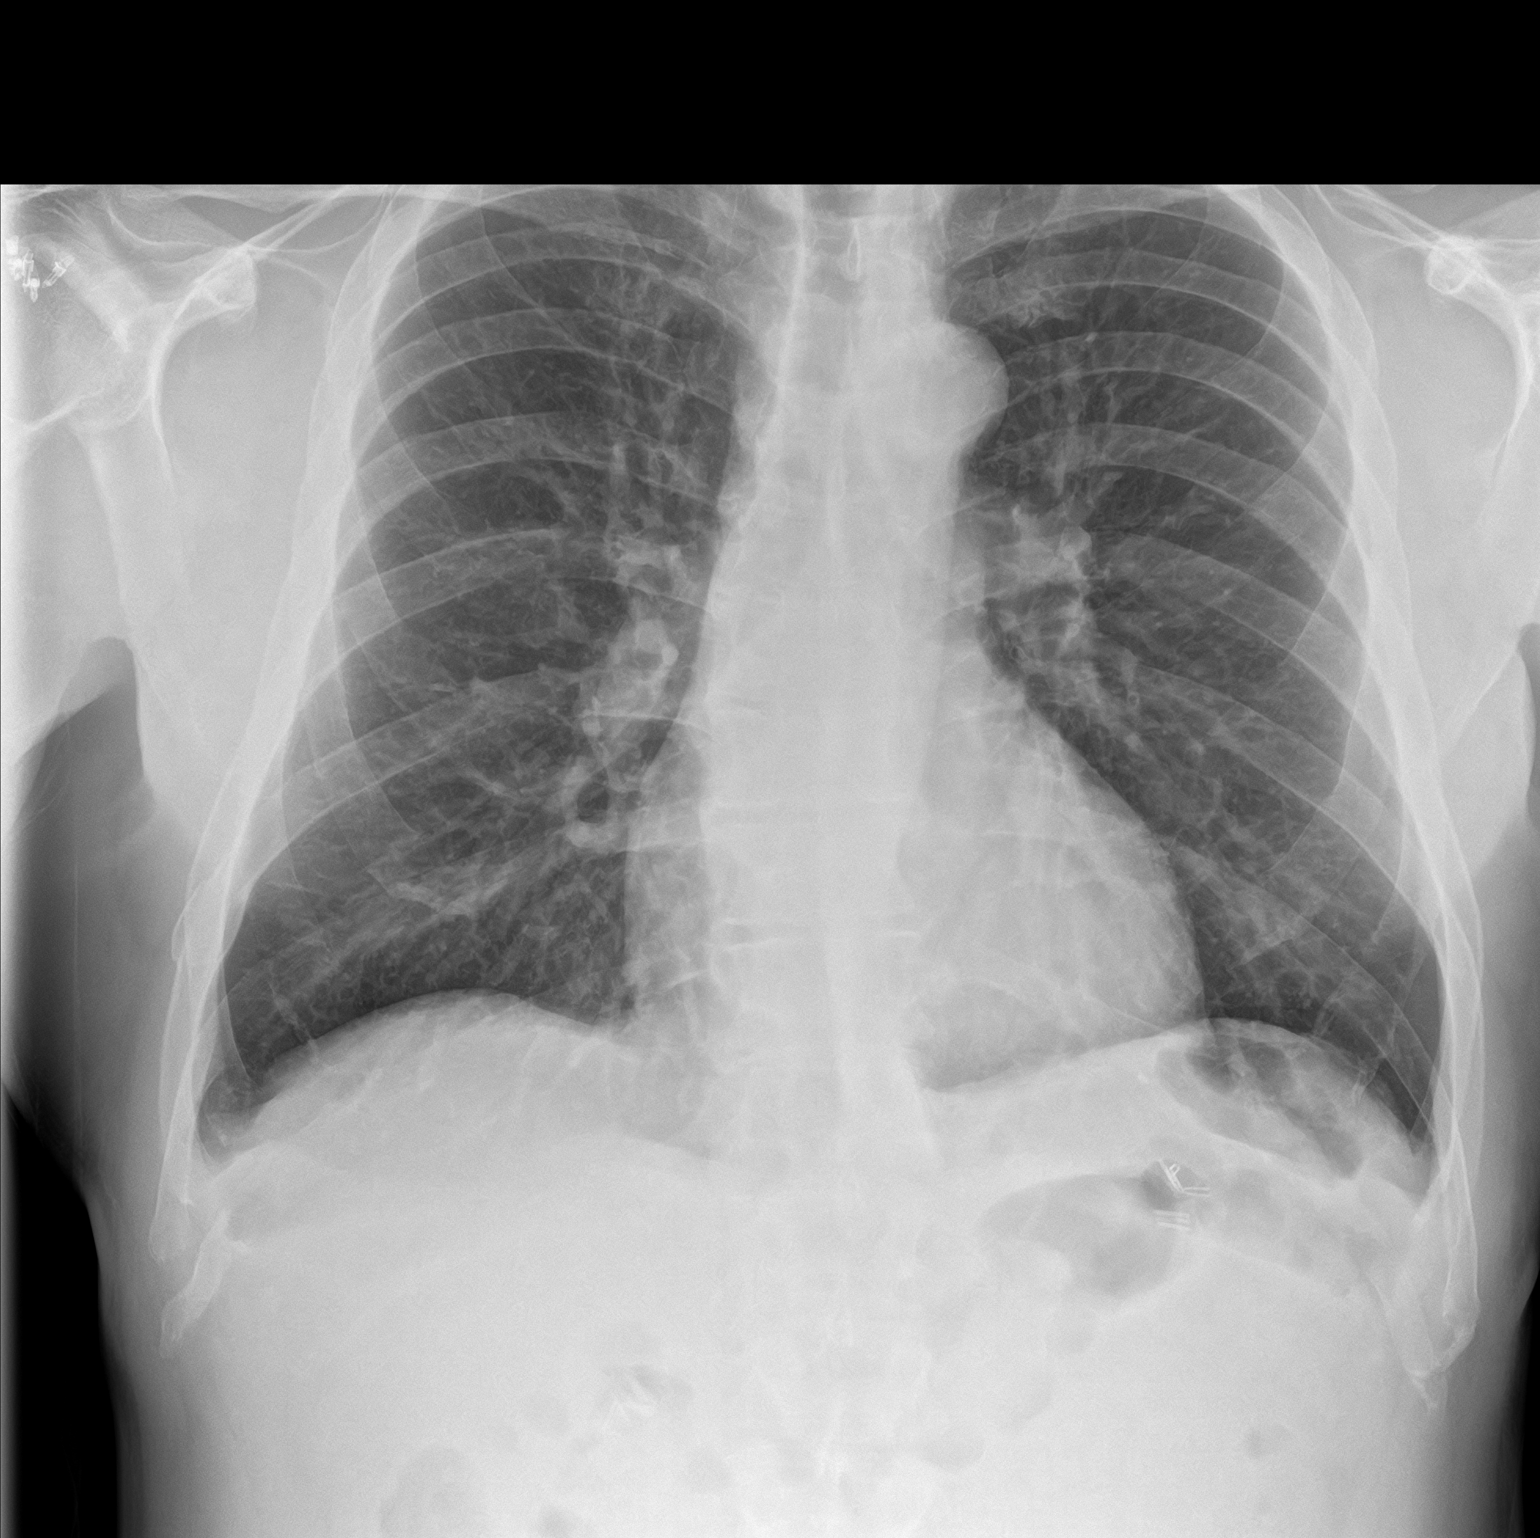
[im 2/2]
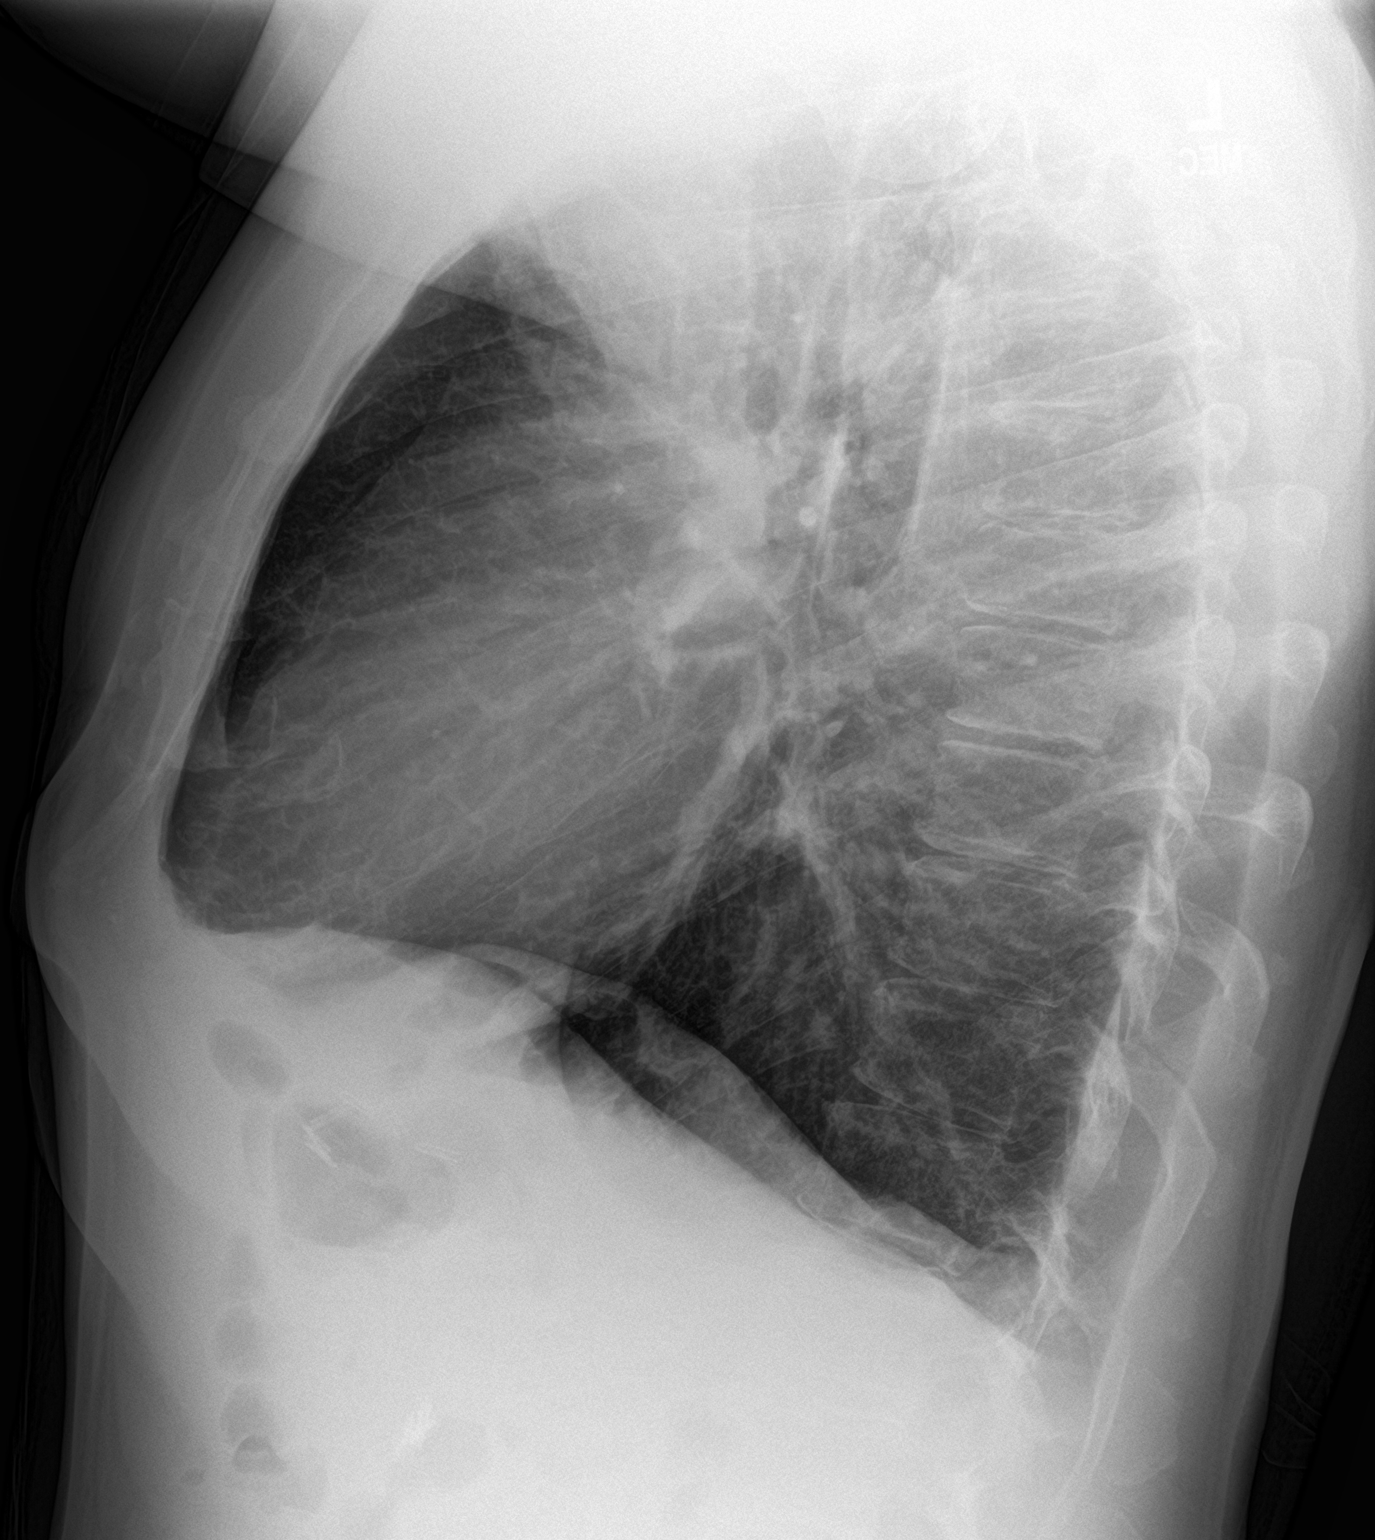

[2 of 2 positions shown; findings below may reference images not displayed]

FINDINGS: Normal heart size, mediastinal contours, and pulmonary vascularity.

Lungs clear.

No pleural effusion or pneumothorax.

Minimal scoliosis.
IMPRESSION: No acute abnormalities.

## 2021-11-17 ENCOUNTER — Inpatient Hospital Stay: Payer: Managed Care, Other (non HMO) | Attending: Oncology

## 2021-11-17 DIAGNOSIS — E538 Deficiency of other specified B group vitamins: Secondary | ICD-10-CM | POA: Insufficient documentation

## 2021-11-19 ENCOUNTER — Inpatient Hospital Stay: Payer: Managed Care, Other (non HMO)

## 2021-11-19 ENCOUNTER — Encounter: Payer: Self-pay | Admitting: Oncology

## 2021-11-19 ENCOUNTER — Other Ambulatory Visit: Payer: Self-pay

## 2021-11-19 ENCOUNTER — Inpatient Hospital Stay (HOSPITAL_BASED_OUTPATIENT_CLINIC_OR_DEPARTMENT_OTHER): Payer: Managed Care, Other (non HMO) | Admitting: Oncology

## 2021-11-19 VITALS — BP 117/85 | HR 84 | Temp 97.9°F | Wt 197.0 lb

## 2021-11-19 DIAGNOSIS — Z789 Other specified health status: Secondary | ICD-10-CM | POA: Diagnosis not present

## 2021-11-19 DIAGNOSIS — D508 Other iron deficiency anemias: Secondary | ICD-10-CM

## 2021-11-19 DIAGNOSIS — E538 Deficiency of other specified B group vitamins: Secondary | ICD-10-CM

## 2021-11-19 LAB — CBC WITH DIFFERENTIAL/PLATELET
Abs Immature Granulocytes: 0.02 10*3/uL (ref 0.00–0.07)
Basophils Absolute: 0 10*3/uL (ref 0.0–0.1)
Basophils Relative: 1 %
Eosinophils Absolute: 0.5 10*3/uL (ref 0.0–0.5)
Eosinophils Relative: 8 %
HCT: 43.3 % (ref 39.0–52.0)
Hemoglobin: 15.3 g/dL (ref 13.0–17.0)
Immature Granulocytes: 0 %
Lymphocytes Relative: 22 %
Lymphs Abs: 1.3 10*3/uL (ref 0.7–4.0)
MCH: 33 pg (ref 26.0–34.0)
MCHC: 35.3 g/dL (ref 30.0–36.0)
MCV: 93.3 fL (ref 80.0–100.0)
Monocytes Absolute: 0.7 10*3/uL (ref 0.1–1.0)
Monocytes Relative: 11 %
Neutro Abs: 3.5 10*3/uL (ref 1.7–7.7)
Neutrophils Relative %: 58 %
Platelets: 282 10*3/uL (ref 150–400)
RBC: 4.64 MIL/uL (ref 4.22–5.81)
RDW: 12.9 % (ref 11.5–15.5)
WBC: 6 10*3/uL (ref 4.0–10.5)
nRBC: 0 % (ref 0.0–0.2)

## 2021-11-19 LAB — VITAMIN B12: Vitamin B-12: >7500 pg/mL — ABNORMAL HIGH (ref 180–914)

## 2021-11-19 LAB — IRON AND TIBC
Iron: 131 ug/dL (ref 45–182)
Saturation Ratios: 42 % — ABNORMAL HIGH (ref 17.9–39.5)
TIBC: 309 ug/dL (ref 250–450)
UIBC: 178 ug/dL

## 2021-11-19 LAB — FERRITIN: Ferritin: 55 ng/mL (ref 24–336)

## 2021-11-19 MED ORDER — CYANOCOBALAMIN 1000 MCG/ML IJ SOLN: 1000.0000 ug | INTRAMUSCULAR | 0 refills | Status: DC

## 2021-11-19 MED ORDER — CYANOCOBALAMIN 1000 MCG/ML IJ SOLN
1000.0000 ug | Freq: Once | INTRAMUSCULAR | Status: AC
  Administered 2021-11-19: 1000 ug via INTRAMUSCULAR
  Filled 2021-11-19: qty 1

## 2021-11-19 NOTE — Progress Notes (Signed)
Hematology/Oncology Progress note [phone] [phone]      Patient Care Team: Pcp, No as PCP - General Rickard Patience, MD as Consulting Physician (Hematology and Oncology)  REFERRING PROVIDER: Rickard Patience, MD  CHIEF COMPLAINTS/REASON FOR VISIT:   iron deficiency anemia, B12 deficiency.   HISTORY OF PRESENTING ILLNESS:   Trevor Torres is a  51 y.o.  male with PMH listed below was seen in consultation at the request of  Rickard Patience, MD  for evaluation of iron deficiency anemia  Patient has history of gastric sleeve. 10/01/2020- 10/04/2020 admitted due to near syncope, echo showed LVEF 50-55% with normal diastolic function, no wall motion abnormalities. Hyponatremia, which improved with hydration.  Low B12 level at 60, started on B12 supplementation.  Anemia, microcytic, low iron, started on oral iron supplementation.  Dysphagia, S/p EGD which showed mucosal changes in the duodenum, biopsied & biopsies taken at the gastric body, incisura & gastric antrum. S/p colonoscopy showed non-bleeding internal hemorrhoids.  all biopsies negative for malignancy, dysplasia. Negative H Pylori. 10/16/2020 ED visit for fatigue. Hb slightly improved to 10.9.  10/24/2020 seen [facility]. 11/04/2020 capsule study is negative.   He also has chronic hyponatremia and he puts salt on his food.  Significant family history of cancer- see family history. He smoked socially for a few years in his 58s. Quitted remotely.   Patient has had daily vitamin B12 injections followed by weekly and currently on monthly B12 injections schedule. Patient was seen by nurse practitioner in November 2022.  He continues to have unintentional weight loss.  CT was obtained for further evaluation.  04/17/2021, CT chest abdomen pelvis with contrast showed coronary artery calcification and aortic atherosclerosis.  No lymphadenopathy.  No acute findings in abdomen and pelvis.  No new adenopathy.  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  Post cholecystectomy.   Family history of cancer. Genetic testing is negative.  INTERVAL HISTORY Trevor Torres is a 51 y.o. male who has above history reviewed by me today presents for follow up visit for management of  iron deficiency anemia, B12 deficiency.  Patient reports that he has cut down alcohol consumption. Patient takes sublingual vitamin B12 supplementation.  He has no new complaints.  Review of Systems  Constitutional:  Positive for fatigue. Negative for appetite change, chills, fever and unexpected weight change.  HENT:   Negative for hearing loss and voice change.   Eyes:  Negative for eye problems and icterus.  Respiratory:  Negative for chest tightness, cough and shortness of breath.   Cardiovascular:  Negative for chest pain and leg swelling.  Gastrointestinal:  Negative for abdominal distention and abdominal pain.  Endocrine: Negative for hot flashes.  Genitourinary:  Negative for difficulty urinating, dysuria and frequency.   Musculoskeletal:  Negative for arthralgias.  Skin:  Negative for itching and rash.  Neurological:  Negative for light-headedness and numbness.  Hematological:  Negative for adenopathy. Does not bruise/bleed easily.  Psychiatric/Behavioral:  Negative for confusion.     MEDICAL HISTORY:  Past Medical History:  Diagnosis Date   Anemia    Anxiety    Cellulitis 01/2015   left arm   Family history of breast cancer    Family history of breast cancer    Family history of colon cancer    Family history of kidney cancer    Family history of lung cancer    Family history of prostate cancer    GERD (gastroesophageal reflux disease)    H/O  PRIOR TO WEIGHT LOSS   Headache    HTN (hypertension) 01/07/2015   H/O/ GASTRIC SLEEVE /WT LOSS   Hypothyroidism    not since weight loss   MRSA infection 2014   h/o years ago   PONV (postoperative nausea and vomiting) 01/29/2020   Renal insufficiency    error in entry    Sleep apnea    H/O PRIOR TO WEIGHT LOSS    SURGICAL HISTORY: Past Surgical History:  Procedure Laterality Date   CHOLECYSTECTOMY N/A 01/14/2018   Procedure: LAPAROSCOPIC CHOLECYSTECTOMY;  Surgeon: Ancil Linsey, MD;  Location: [facility] ORS;  Service: General;  Laterality: N/A;   COLONOSCOPY  2015   COLONOSCOPY N/A 10/03/2020   Procedure: COLONOSCOPY;  Surgeon: Pasty Spillers, MD;  Location: [facility] ENDOSCOPY;  Service: Endoscopy;  Laterality: N/A;   ESOPHAGOGASTRODUODENOSCOPY  2015   ESOPHAGOGASTRODUODENOSCOPY N/A 10/03/2020   Procedure: ESOPHAGOGASTRODUODENOSCOPY (EGD);  Surgeon: Pasty Spillers, MD;  Location: [facility] ENDOSCOPY;  Service: Endoscopy;  Laterality: N/A;   LAPAROSCOPIC GASTRIC SLEEVE RESECTION  2013   SHOULDER ARTHROSCOPY WITH OPEN ROTATOR CUFF REPAIR Right 02/06/2015   Procedure: SHOULDER ARTHROSCOPY WITH OPEN ROTATOR CUFF REPAIR;  Surgeon: Juanell Fairly, MD;  Location: [facility]S;  Service: Orthopedics;  Laterality: Right;   XI ROBOTIC ASSISTED VENTRAL HERNIA N/A 01/29/2020   Procedure: XI ROBOTIC ASSISTED INCISIONAL HERNIA REPAIR WITH MESH;  Surgeon: Carolan Shiver, MD;  Location: [facility] ORS;  Service: General;  Laterality: N/A;    SOCIAL HISTORY: Social History   Socioeconomic History   Marital status: Divorced    Spouse name: Not on file   Number of children: Not on file   Years of education: Not on file   Highest education level: Not on file  Occupational History   Not on file  Tobacco Use   Smoking status: Never   Smokeless tobacco: Never  Vaping Use   Vaping Use: Never used  Substance and Sexual Activity   Alcohol use: Yes    Comment: 3-4 beers per week   Drug use: No   Sexual activity: Not on file  Other Topics Concern   Not on file  Social History Narrative   Not on file   Social Determinants of Health   Financial Resource Strain: Not on file  Food Insecurity: Not on file  Transportation Needs: Not on file  Physical Activity: Not on  file  Stress: Not on file  Social Connections: Not on file  Intimate Partner Violence: Not on file    FAMILY HISTORY: Family History  Problem Relation Age of Onset   Cancer Mother        Breast Ca   Diabetes Mother    Hypertension Mother    Cancer Father        Lung Ca   Cancer Maternal Aunt        Colon and Kidney Ca   Cancer Maternal Aunt        Breast Ca   Cancer Maternal Grandfather        Lung Ca    ALLERGIES:  has no active allergies.  MEDICATIONS:  Current Outpatient Medications  Medication Sig Dispense Refill   acetaminophen (TYLENOL) 500 MG tablet Take 500-1,000 mg by mouth every 6 (six) hours as needed (for pain.).     Cholecalciferol (VITAMIN D3) 125 MCG (5000 UT) CAPS Take 1 capsule by mouth daily.     cyanocobalamin (,VITAMIN B-12,) 1000 MCG/ML injection Inject 1 mL (1,000 mcg total) into the muscle See admin instructions. B12  injection weekly x 4, followed B12 injection monthly 16 mL 0   ibuprofen (ADVIL) 600 MG tablet Take 1 tablet (600 mg total) by mouth every 6 (six) hours as needed. 30 tablet 0   Multiple Vitamin (MULTIVITAMIN WITH MINERALS) TABS tablet Take 1 tablet by mouth daily.     zinc gluconate 50 MG tablet Take 50 mg by mouth daily.     ferrous gluconate (FERGON) 324 MG tablet Take 1 tablet (324 mg total) by mouth 2 (two) times daily with a meal. 60 tablet 0   traZODone (DESYREL) 50 MG tablet Take 0.5 tablets (25 mg total) by mouth at bedtime as needed for sleep. 15 tablet 0   No current facility-administered medications for this visit.     PHYSICAL EXAMINATION: ECOG PERFORMANCE STATUS: 1 - Symptomatic but completely ambulatory Vitals:   11/19/21 1434  BP: 117/85  Pulse: 84  Temp: 97.9 F (36.6 C)   Filed Weights   11/19/21 1434  Weight: 197 lb (89.4 kg)    Physical Exam Constitutional:      General: He is not in acute distress. HENT:     Head: Normocephalic and atraumatic.  Eyes:     General: No scleral icterus. Cardiovascular:      Rate and Rhythm: Normal rate and regular rhythm.     Heart sounds: Normal heart sounds.  Pulmonary:     Effort: Pulmonary effort is normal. No respiratory distress.     Breath sounds: No wheezing.  Abdominal:     General: Bowel sounds are normal. There is no distension.     Palpations: Abdomen is soft.  Musculoskeletal:        General: No deformity. Normal range of motion.     Cervical back: Normal range of motion and neck supple.  Skin:    General: Skin is warm and dry.     Findings: No erythema or rash.  Neurological:     Mental Status: He is alert and oriented to person, place, and time. Mental status is at baseline.     Cranial Nerves: No cranial nerve deficit.     Coordination: Coordination normal.  Psychiatric:        Mood and Affect: Mood normal.     LABORATORY DATA:  I have reviewed the data as listed     Latest Ref Rng & Units 11/19/2021    3:21 PM 07/14/2021   12:48 PM 04/18/2021    8:10 AM  CBC  WBC 4.0 - 10.5 K/uL 6.0  5.0  4.4   Hemoglobin 13.0 - 17.0 g/dL 65.0  35.4  65.6   Hematocrit 39.0 - 52.0 % 43.3  42.3  41.3   Platelets 150 - 400 K/uL 282  272  274       Latest Ref Rng & Units 02/12/2021   11:13 AM 10/16/2020    9:17 PM 10/04/2020    5:05 AM  CMP  Glucose 70 - 99 mg/dL 55  84  79   BUN 6 - 20 mg/dL 8  22  11    Creatinine 0.61 - 1.24 mg/dL  8.12  7.51   Sodium 135 - 145 mmol/L 129  131  132   Potassium 3.5 - 5.1 mmol/L 4.1  4.1  4.2   Chloride 98 - 111 mmol/L 97  96  102   CO2 22 - 32 mmol/L 26  24  23    Calcium 8.9 - 10.3 mg/dL 8.9  9.2  8.6   Total Protein 6.5 -  8.1 g/dL 7.3  6.8    Total Bilirubin 0.3 - 1.2 mg/dL 1.0  1.3    Alkaline Phos 38 - 126 U/L 38  33    AST 15 - 41 U/L 30  33    ALT 0 - 44 U/L 27  29      Iron/TIBC/Ferritin/ %Sat    Component Value Date/Time   IRON 131 11/19/2021 1521   IRON 94 12/07/2012 0845   TIBC 309 11/19/2021 1521   TIBC 372 12/07/2012 0845   FERRITIN 55 11/19/2021 1521   FERRITIN 49 12/07/2012  0845   IRONPCTSAT 42 (H) 11/19/2021 1521   IRONPCTSAT 25 12/07/2012 0845       RADIOGRAPHIC STUDIES: I have personally reviewed the radiological images as listed and agreed with the findings in the report. No results found.     ASSESSMENT & PLAN:  1. Other iron deficiency anemia   2. Vitamin B12 deficiency   3. Alcohol use    # Iron deficiency anemia.  History of gastric sleeve.  Status post IV Venofer treatments. Iron panel showed slightly increased iron saturation.  Normal ferritin level.  No need for IV Venofer treatment currently.  # Vitamin B12 deficiency, positive intrinsive factor antibody. - pernicious anemia.  Previously was on vitamin B12 injection monthly-given by RN at work. Patient recently had vitamin B12 level checked at primary care doctor's office and B12 level is decreased at 119. Recommend patient to resume vitamin B12 injection.  Proceed with B12 injection today. Additional prescriptions of B12 injections were sent to his pharmacy.  Recommend weekly x4 followed by monthly.  Patient gets B12 injection administrated by RN at work.   # Chronic hyponatremia. Normal morning cortisol, serum osm 275, urine osm decreased at 277.  likely hyponatremia is not ADH dependant.  Likely due to chronic alcohol use His level has improved since the decrease of alcohol use.  Encourage alcohol cessation efforts.    All questions were answered. The patient knows to call the clinic with any problems questions or concerns.  Return of visit: 6 months labs cbc iron tibc ferritin, B12 level/ B12 injection -MD + venofer +/- B12 injection   Rickard Patience, MD, PhD 11/19/2021

## 2021-11-20 ENCOUNTER — Other Ambulatory Visit: Payer: Self-pay

## 2021-11-20 ENCOUNTER — Telehealth: Payer: Self-pay

## 2021-11-20 DIAGNOSIS — D508 Other iron deficiency anemias: Secondary | ICD-10-CM

## 2021-11-20 NOTE — Addendum Note (Signed)
Addended by: Alinda Deem H on: 11/20/2021 09:21 AM   Modules accepted: Orders

## 2021-11-20 NOTE — Telephone Encounter (Signed)
-----   Message from Rickard Patience, MD sent at 11/19/2021  9:51 PM EDT ----- Please let patient know that iron panel is not consistent with iron deficiency.  No need for IV iron treatments.  Slight increase of iron saturation, no intervention needed.  B12 and CBC were pulled by lab again  B12 level is not accurate due to patient received injection right before the testing.  Please contact lab to cancel CBC and vitamin B12.  Please order blood work for the next visit.  CBC, CMP, iron TIBC ferritin, B12.

## 2021-11-20 NOTE — Telephone Encounter (Signed)
Called and informed patient of lab results and Dr. Bethanne Ginger recommendations. Patient verbalized understanding.

## 2022-01-01 IMAGING — CT CT CHEST-ABD-PELV W/ CM
2 of 5 series · 13 of 36 positions shown, 15 images · IV contrast (omnipaque)
Comparison: CT 10/01/2020

CLINICAL DATA: Unexplained weight loss of 49-year-old male. Iron
deficiency anemia.

EXAM:
CT CHEST, ABDOMEN, AND PELVIS WITH CONTRAST
TECHNIQUE: Multidetector CT imaging of the chest, abdomen and pelvis was
performed following the standard protocol during bolus
administration of intravenous contrast.
CONTRAST:  100mL OMNIPAQUE IOHEXOL 300 MG/ML  SOLN

[Series 2: axials cap 5.00 · axial · 0.77mm/px · z∈[-1541,-981]mm · 10 of 138 slices shown, 12 images]
[im 13/138  mediastinal]
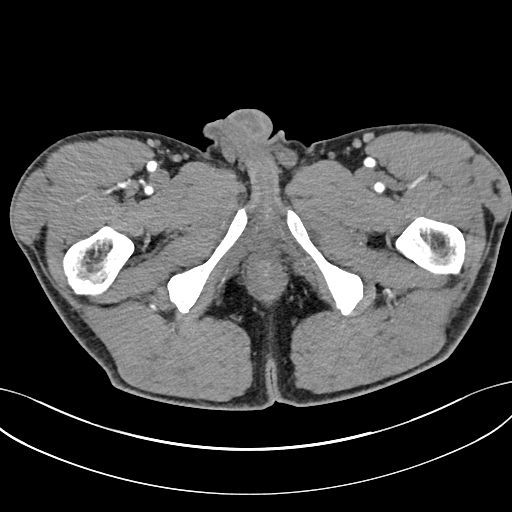
[im 13/138  bone]
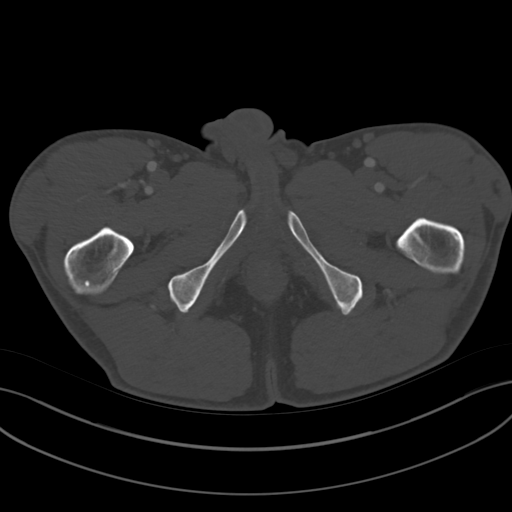
[im 25/138  mediastinal]
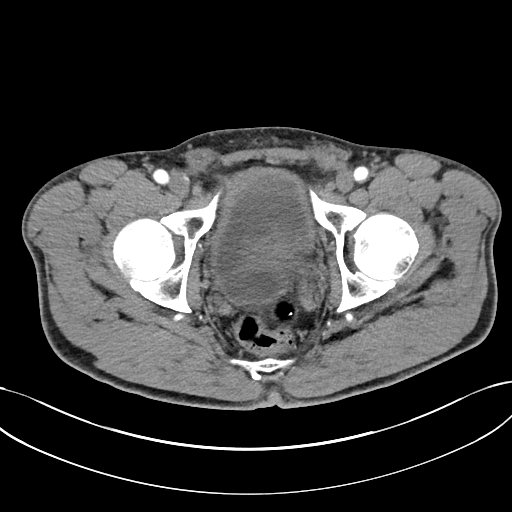
[im 38/138  mediastinal]
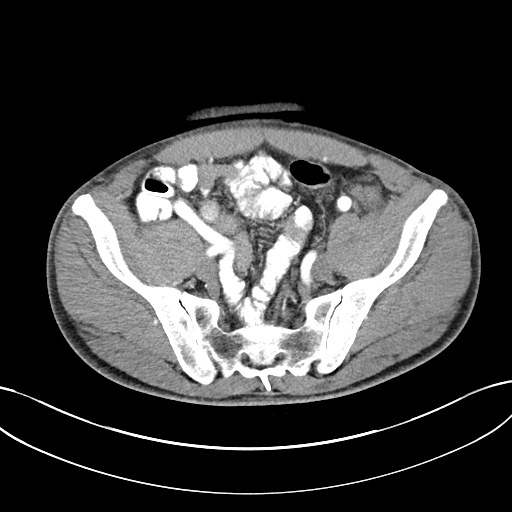
[im 50/138  mediastinal]
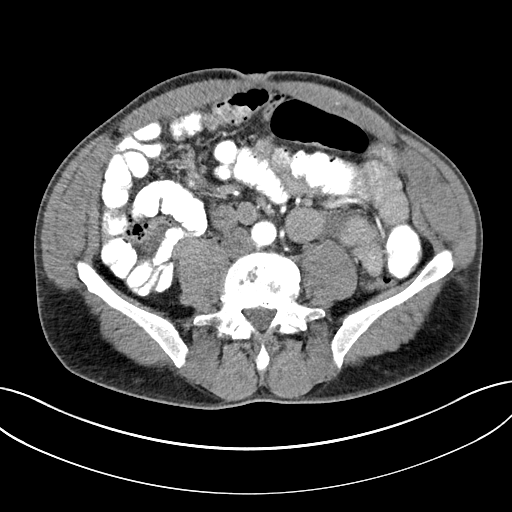
[im 63/138  mediastinal]
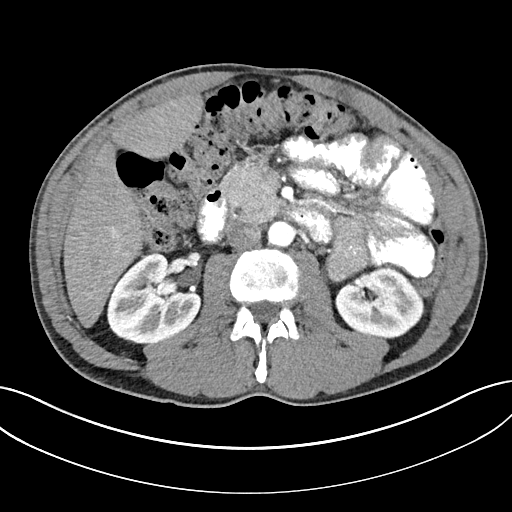
[im 75/138  mediastinal]
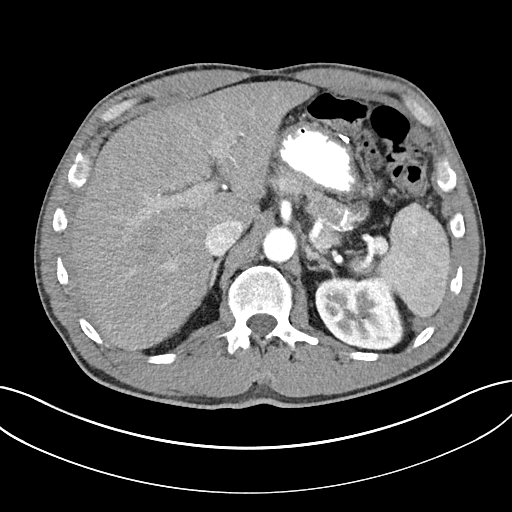
[im 88/138  mediastinal]
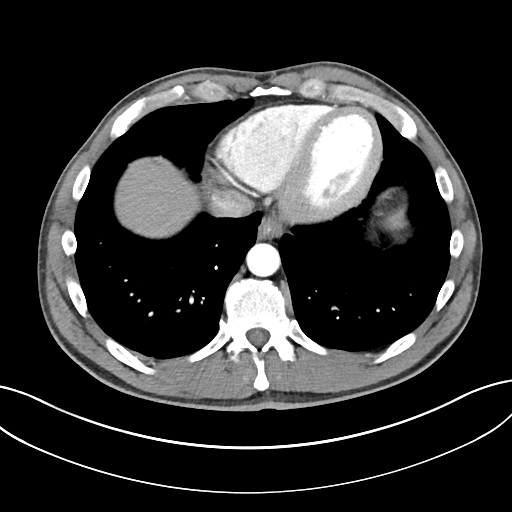
[im 100/138  mediastinal]
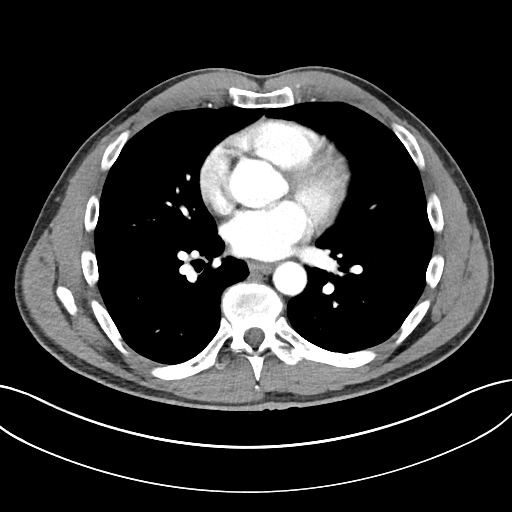
[im 113/138  mediastinal]
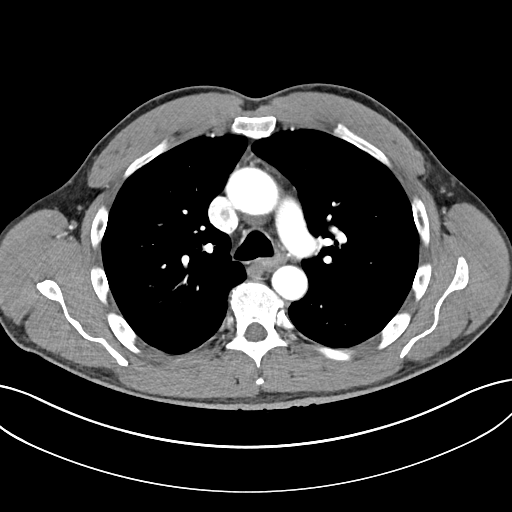
[im 113/138  bone]
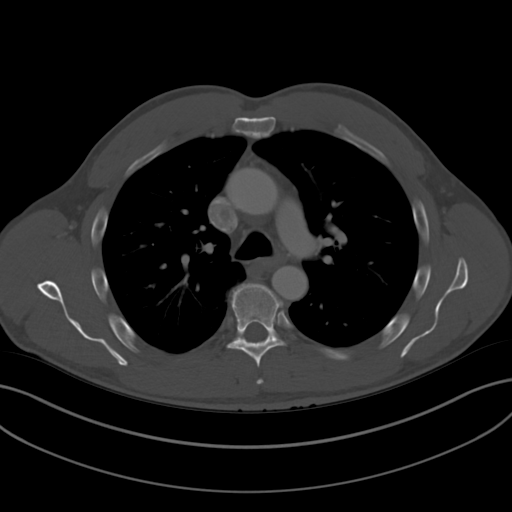
[im 125/138  mediastinal]
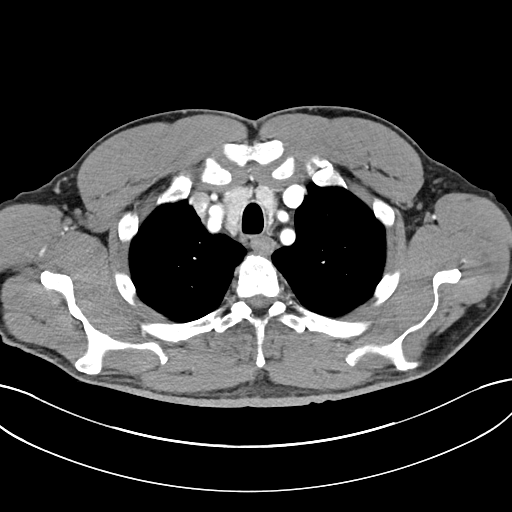

[Series 4: coronals cap 2.00 cor · coronal · 0.77mm/px · 3 of 139 slices shown]
[im 28/139  mediastinal]
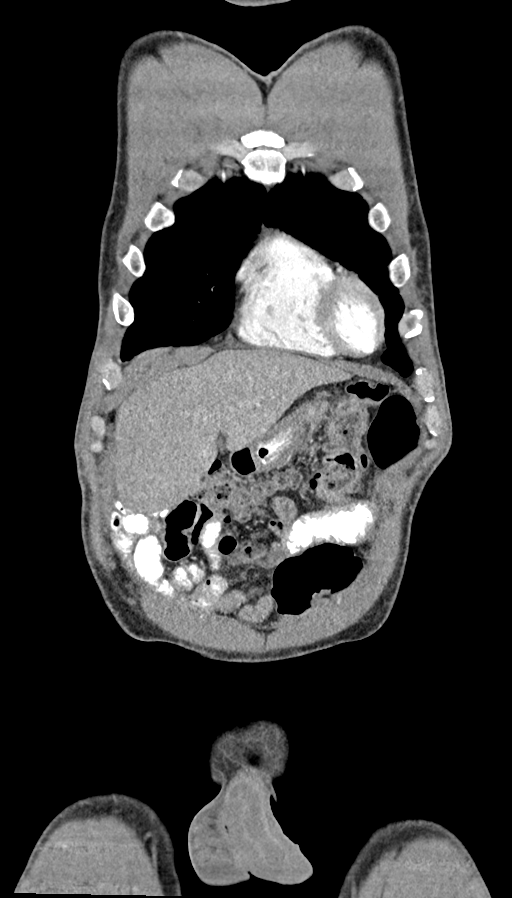
[im 56/139  mediastinal]
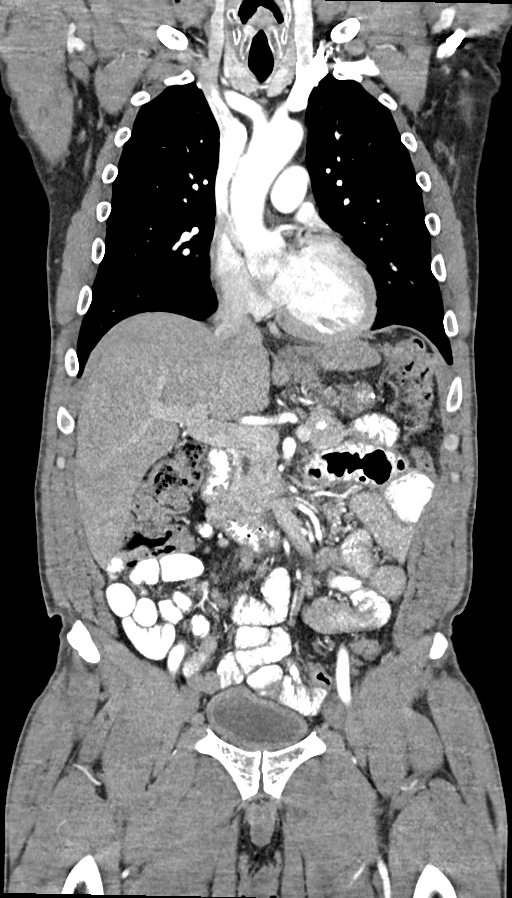
[im 83/139  mediastinal]
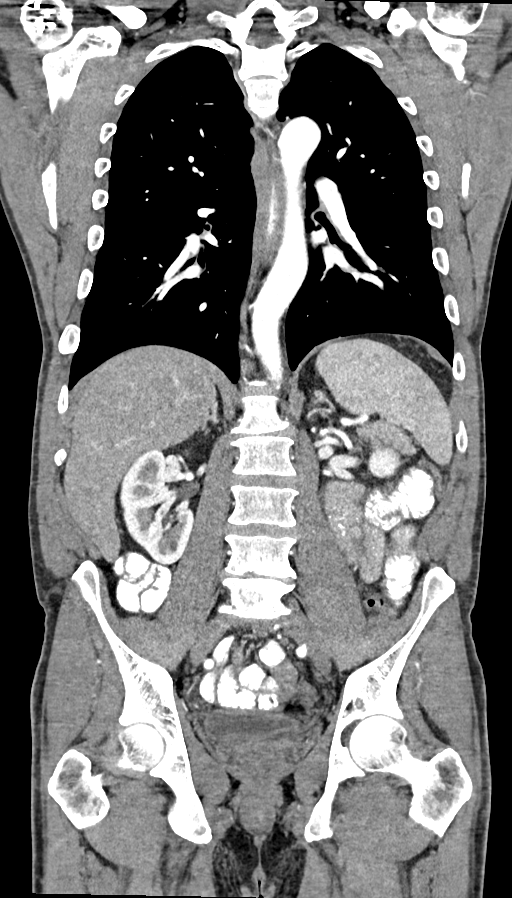

[13 of 36 positions shown; findings below may reference images not displayed]

FINDINGS: CT CHEST FINDINGS

Cardiovascular: Coronary artery calcification and aortic
atherosclerotic calcification. No significant vascular findings.
Normal heart size. No pericardial effusion.

Mediastinum/Nodes: No axillary or supraclavicular adenopathy. No
mediastinal or hilar adenopathy. No pericardial fluid. Esophagus
normal.

Lungs/Pleura: No suspicious pulmonary nodules. Normal pleural.
Airways normal.

Musculoskeletal: No aggressive osseous lesion.

CT ABDOMEN AND PELVIS FINDINGS

Hepatobiliary: No focal hepatic lesion. Postcholecystectomy. No
biliary dilatation.

Pancreas: Pancreas is normal. No ductal dilatation. No pancreatic
inflammation.

Spleen: Normal

Adrenals/urinary tract: Adrenal glands and kidneys are normal. The
ureters and bladder normal.

Stomach/Bowel:

Post gastric sleeve bariatric surgery anatomy. No complication.
Duodenum and small-bowel normal. Contrast reaches the distal ileum .
Appendix not identified. The colon and rectosigmoid colon are
normal.

Vascular/Lymphatic: Abdominal aorta is normal caliber. There is no
retroperitoneal or periportal lymphadenopathy. No pelvic
lymphadenopathy.

Reproductive: Prostate unremarkable.

Other: No free fluid.

Musculoskeletal: No aggressive osseous lesion.
IMPRESSION: Chest Impression:

1. Coronary artery calcification and Aortic Atherosclerosis
([id number]).
2. No lymphadenopathy.  Normal pulmonary

Abdomen / Pelvis Impression:

1. No acute findings in the abdomen pelvis.
2. No lymphadenopathy.
3. Postcholecystectomy.
4. Post gastric sleeve bariatric surgery without complication.

## 2022-01-20 ENCOUNTER — Encounter: Payer: Self-pay | Admitting: Emergency Medicine

## 2022-01-20 ENCOUNTER — Other Ambulatory Visit: Payer: Self-pay

## 2022-01-20 ENCOUNTER — Emergency Department: Payer: Managed Care, Other (non HMO)

## 2022-01-20 ENCOUNTER — Emergency Department
Admission: EM | Admit: 2022-01-20 | Discharge: 2022-01-20 | Disposition: A | Discharge status: Home / Self Care | Payer: Managed Care, Other (non HMO) | Attending: Emergency Medicine | Admitting: Emergency Medicine

## 2022-01-20 DIAGNOSIS — Z20822 Contact with and (suspected) exposure to covid-19: Secondary | ICD-10-CM | POA: Insufficient documentation

## 2022-01-20 DIAGNOSIS — F411 Generalized anxiety disorder: Secondary | ICD-10-CM

## 2022-01-20 DIAGNOSIS — R079 Chest pain, unspecified: Secondary | ICD-10-CM | POA: Insufficient documentation

## 2022-01-20 DIAGNOSIS — F419 Anxiety disorder, unspecified: Secondary | ICD-10-CM | POA: Diagnosis not present

## 2022-01-20 DIAGNOSIS — F32A Depression, unspecified: Secondary | ICD-10-CM | POA: Diagnosis not present

## 2022-01-20 LAB — TROPONIN I (HIGH SENSITIVITY)
Troponin I (High Sensitivity): 3 ng/L (ref <18)
Troponin I (High Sensitivity): 3 ng/L (ref <18)

## 2022-01-20 LAB — BASIC METABOLIC PANEL
Anion gap: 13 (ref 5–15)
BUN: 12 mg/dL (ref 6–20)
CO2: 21 mmol/L — ABNORMAL LOW (ref 22–32)
Calcium: 9.7 mg/dL (ref 8.9–10.3)
Chloride: 98 mmol/L (ref 98–111)
Creatinine, Ser: 0.97 mg/dL (ref 0.61–1.24)
GFR, Estimated: >60 mL/min (ref >60)
Glucose, Bld: 89 mg/dL (ref 70–99)
Potassium: 4.1 mmol/L (ref 3.5–5.1)
Sodium: 132 mmol/L — ABNORMAL LOW (ref 135–145)

## 2022-01-20 LAB — CBC
HCT: 44.3 % (ref 39.0–52.0)
Hemoglobin: 15.4 g/dL (ref 13.0–17.0)
MCH: 31.8 pg (ref 26.0–34.0)
MCHC: 34.8 g/dL (ref 30.0–36.0)
MCV: 91.5 fL (ref 80.0–100.0)
Platelets: 323 10*3/uL (ref 150–400)
RBC: 4.84 MIL/uL (ref 4.22–5.81)
RDW: 12.8 % (ref 11.5–15.5)
WBC: 5.9 10*3/uL (ref 4.0–10.5)
nRBC: 0 % (ref 0.0–0.2)

## 2022-01-20 LAB — SARS CORONAVIRUS 2 BY RT PCR: SARS Coronavirus 2 by RT PCR: NEGATIVE

## 2022-01-20 LAB — PROCALCITONIN: Procalcitonin: <0.1 ng/mL

## 2022-01-20 MED ORDER — HYDROXYZINE HCL 10 MG PO TABS
10.0000 mg | ORAL_TABLET | Freq: Three times a day (TID) | ORAL | 0 refills | Status: AC | PRN
Start: 2022-01-20 — End: 2022-02-19

## 2022-01-20 MED ORDER — HYDROXYZINE HCL 10 MG PO TABS
10.0000 mg | ORAL_TABLET | Freq: Three times a day (TID) | ORAL | Status: DC | PRN
  Administered 2022-01-20: 10 mg via ORAL
  Filled 2022-01-20 (×2): qty 1

## 2022-01-20 MED ORDER — SODIUM CHLORIDE 0.9 % IV BOLUS
1000.0000 mL | Freq: Once | INTRAVENOUS | Status: AC
  Administered 2022-01-20: 1000 mL via INTRAVENOUS

## 2022-01-20 NOTE — ED Provider Notes (Signed)
Nanine Means from psychiatry discussed the case with me.  She has seen and provided her consult.  She has provided him a prescription for hydroxyzine.  As she relates that he can be discharged to follow-up with outpatient resources.  He has been previously medically cleared for his evaluation for chest pain and recommendation to follow-up with primary care and return precautions are given.  Vitals:   01/20/22 1100 01/20/22 1130  BP: (!) 133/97 (!) 134/99  Pulse: 68 66  Resp: 13 13  Temp:    SpO2: 98%       Sharyn Creamer, MD 01/20/22 1651

## 2022-01-20 NOTE — Consult Note (Signed)
[facility] Psychiatry Consult   Reason for Consult:  anxiety, depression Referring Physician:  EDP Patient Identification: MACARIUS RUSZKOWSKI MRN:  [id number] Principal Diagnosis: <principal problem not specified> Diagnosis:  Active Problems:   Generalized anxiety disorder   Total Time spent with patient: 45 minutes  Subjective:   DOMINYK UNTERREINER is a 51 y.o. male patient admitted with shoulder issues.  HPI:  51 yo male presented with should pain related to recent surgery.  He has been depressed and anxious for two weeks after his girlfriend ended their 6 month relationship.  Moderate depression with no suicidal ideations, hallucinations.  He does drink at times, does not feel it is an issues.  High anxiety with panic attacks at times.  Low appetite and sleep.  He is interested in seeing a therapist and psychiatrist, outpatient referrals provided. Discussed medications and started hydroxyzine PRN to assist his symptoms, PRN anxiety at 10 mg and 20-30 mg PRN for sleep  Past Psychiatric History: anxiety, substance over use  Risk to Self:  none Risk to Others:  none Prior Inpatient Therapy:  none Prior Outpatient Therapy:  none  Past Medical History:  Past Medical History:  Diagnosis Date   Anemia    Anxiety    Cellulitis 01/2015   left arm   Family history of breast cancer    Family history of breast cancer    Family history of colon cancer    Family history of kidney cancer    Family history of lung cancer    Family history of prostate cancer    GERD (gastroesophageal reflux disease)    H/O PRIOR TO WEIGHT LOSS   Headache    HTN (hypertension) 01/07/2015   H/O/ GASTRIC SLEEVE /WT LOSS   Hypothyroidism    not since weight loss   MRSA infection 2014   h/o years ago   PONV (postoperative nausea and vomiting) 01/29/2020   Renal insufficiency    error in entry   Sleep apnea    H/O PRIOR TO WEIGHT LOSS    Past Surgical History:  Procedure Laterality Date    CHOLECYSTECTOMY N/A 01/14/2018   Procedure: LAPAROSCOPIC CHOLECYSTECTOMY;  Surgeon: Vickie Epley, MD;  Location: [facility] ORS;  Service: General;  Laterality: N/A;   COLONOSCOPY  2015   COLONOSCOPY N/A 10/03/2020   Procedure: COLONOSCOPY;  Surgeon: Virgel Manifold, MD;  Location: [facility] ENDOSCOPY;  Service: Endoscopy;  Laterality: N/A;   ESOPHAGOGASTRODUODENOSCOPY  2015   ESOPHAGOGASTRODUODENOSCOPY N/A 10/03/2020   Procedure: ESOPHAGOGASTRODUODENOSCOPY (EGD);  Surgeon: Virgel Manifold, MD;  Location: [facility] ENDOSCOPY;  Service: Endoscopy;  Laterality: N/A;   LAPAROSCOPIC GASTRIC SLEEVE RESECTION  2013   SHOULDER ARTHROSCOPY WITH OPEN ROTATOR CUFF REPAIR Right 02/06/2015   Procedure: SHOULDER ARTHROSCOPY WITH OPEN ROTATOR CUFF REPAIR;  Surgeon: Thornton Park, MD;  Location: [facility] ORS;  Service: Orthopedics;  Laterality: Right;   XI ROBOTIC ASSISTED VENTRAL HERNIA N/A 01/29/2020   Procedure: XI ROBOTIC ASSISTED INCISIONAL HERNIA REPAIR WITH MESH;  Surgeon: Herbert Pun, MD;  Location: [facility] ORS;  Service: General;  Laterality: N/A;   Family History:  Family History  Problem Relation Age of Onset   Cancer Mother        Breast Ca   Diabetes Mother    Hypertension Mother    Cancer Father        Lung Ca   Cancer Maternal Aunt        Colon and Kidney Ca   Cancer Maternal Aunt  Breast Ca   Cancer Maternal Grandfather        Lung Ca   Family Psychiatric  History: see above Social History:  Social History   Substance and Sexual Activity  Alcohol Use Yes   Comment: 3-4 beers per week     Social History   Substance and Sexual Activity  Drug Use No    Social History   Socioeconomic History   Marital status: Divorced    Spouse name: Not on file   Number of children: Not on file   Years of education: Not on file   Highest education level: Not on file  Occupational History   Not on file  Tobacco Use   Smoking status: Never   Smokeless tobacco: Never   Tobacco  comments:    Occassional cigar  Vaping Use   Vaping Use: Never used  Substance and Sexual Activity   Alcohol use: Yes    Comment: 3-4 beers per week   Drug use: No   Sexual activity: Not on file  Other Topics Concern   Not on file  Social History Narrative   Not on file   Social Determinants of Health   Financial Resource Strain: Not on file  Food Insecurity: Not on file  Transportation Needs: Not on file  Physical Activity: Not on file  Stress: Not on file  Social Connections: Not on file   Additional Social History:    Allergies:  No Known Allergies  Labs:  Results for orders placed or performed during the hospital encounter of 01/20/22 (from the past 48 hour(s))  Basic metabolic panel     Status: Abnormal   Collection Time: 01/20/22  9:47 AM  Result Value Ref Range   Sodium 132 (L) 135 - 145 mmol/L   Potassium 4.1 3.5 - 5.1 mmol/L   Chloride 98 98 - 111 mmol/L   CO2 21 (L) 22 - 32 mmol/L   Glucose, Bld 89 70 - 99 mg/dL    Comment: Glucose reference range applies only to samples taken after fasting for at least 8 hours.   BUN 12 6 - 20 mg/dL   Creatinine, Ser 0.97 0.61 - 1.24 mg/dL   Calcium 9.7 8.9 - 10.3 mg/dL   GFR, Estimated >60 >60 mL/min    Comment: (NOTE) Calculated using the CKD-EPI Creatinine Equation (2021)    Anion gap 13 5 - 15    Comment: Performed at [facility], [address] [place], [place] [address]  CBC     Status: None   Collection Time: 01/20/22  9:47 AM  Result Value Ref Range   WBC 5.9 4.0 - 10.5 K/uL   RBC 4.84 4.22 - 5.81 MIL/uL   Hemoglobin 15.4 13.0 - 17.0 g/dL   HCT 44.3 39.0 - 52.0 %   MCV 91.5 80.0 - 100.0 fL   MCH 31.8 26.0 - 34.0 pg   MCHC 34.8 30.0 - 36.0 g/dL   RDW 12.8 11.5 - 15.5 %   Platelets 323 150 - 400 K/uL   nRBC 0.0 0.0 - 0.2 %    Comment: Performed at [facility], [place], [place] [address]  Troponin I (High Sensitivity)     Status: None   Collection Time: 01/20/22   9:47 AM  Result Value Ref Range   Troponin I (High Sensitivity) 3 <18 ng/L    Comment: (NOTE) Elevated high sensitivity troponin I (hsTnI) values and significant  changes across serial measurements may suggest ACS but  many other  chronic and acute conditions are known to elevate hsTnI results.  Refer to the "Links" section for chest pain algorithms and additional  guidance. Performed at [facility], [address] [place], [place] [address]   Procalcitonin - Baseline     Status: None   Collection Time: 01/20/22 10:47 AM  Result Value Ref Range   Procalcitonin <0.10 ng/mL    Comment:        Interpretation: PCT (Procalcitonin) <= 0.5 ng/mL: Systemic infection (sepsis) is not likely. Local bacterial infection is possible. (NOTE)       Sepsis PCT Algorithm           Lower Respiratory Tract                                      Infection PCT Algorithm    ----------------------------     ----------------------------         PCT < 0.25 ng/mL                PCT < 0.10 ng/mL          Strongly encourage             Strongly discourage   discontinuation of antibiotics    initiation of antibiotics    ----------------------------     -----------------------------       PCT 0.25 - 0.50 ng/mL            PCT 0.10 - 0.25 ng/mL               OR       >80% decrease in PCT            Discourage initiation of                                            antibiotics      Encourage discontinuation           of antibiotics    ----------------------------     -----------------------------         PCT >= 0.50 ng/mL              PCT 0.26 - 0.50 ng/mL               AND        <80% decrease in PCT             Encourage initiation of                                             antibiotics       Encourage continuation           of antibiotics    ----------------------------     -----------------------------        PCT >= 0.50 ng/mL                  PCT > 0.50 ng/mL               AND          increase in PCT  Strongly encourage                                      initiation of antibiotics    Strongly encourage escalation           of antibiotics                                     -----------------------------                                           PCT <= 0.25 ng/mL                                                 OR                                        > 80% decrease in PCT                                      Discontinue / Do not initiate                                             antibiotics  Performed at [facility], [address] [place], [place] [address]   SARS Coronavirus 2 by RT PCR (hospital order, performed in [facility] hospital lab) *cepheid single result test* Anterior Nasal Swab     Status: None   Collection Time: 01/20/22 10:49 AM   Specimen: Anterior Nasal Swab  Result Value Ref Range   SARS Coronavirus 2 by RT PCR NEGATIVE NEGATIVE    Comment: (NOTE) SARS-CoV-2 target nucleic acids are NOT DETECTED.  The SARS-CoV-2 RNA is generally detectable in upper and lower respiratory specimens during the acute phase of infection. The lowest concentration of SARS-CoV-2 viral copies this assay can detect is 250 copies / mL. A negative result does not preclude SARS-CoV-2 infection and should not be used as the sole basis for treatment or other patient management decisions.  A negative result may occur with improper specimen collection / handling, submission of specimen other than nasopharyngeal swab, presence of viral mutation(s) within the areas targeted by this assay, and inadequate number of viral copies (<250 copies / mL). A negative result must be combined with clinical observations, patient history, and epidemiological information.  Fact Sheet for Patients:   https://www.patel.info/  Fact Sheet for Healthcare Providers: https://hall.com/  This test is not yet approved or   cleared by the [place] FDA and has been authorized for detection and/or diagnosis of SARS-CoV-2 by FDA under an Emergency Use Authorization (EUA).  This EUA will remain in effect (meaning this test can be used) for the duration of the COVID-19 declaration under Section 564(b)(1) of the Act, 21 U.S.C. section 360bbb-3(b)(1), unless the authorization  is terminated or revoked sooner.  Performed at [facility], [address] [place], [place] [address]   Troponin I (High Sensitivity)     Status: None   Collection Time: 01/20/22 11:30 AM  Result Value Ref Range   Troponin I (High Sensitivity) 3 <18 ng/L    Comment: (NOTE) Elevated high sensitivity troponin I (hsTnI) values and significant  changes across serial measurements may suggest ACS but many other  chronic and acute conditions are known to elevate hsTnI results.  Refer to the "Links" section for chest pain algorithms and additional  guidance. Performed at [facility], [address], [place], [place] [address]     Current Facility-Administered Medications  Medication Dose Route Frequency Provider Last Rate Last Admin   hydrOXYzine (ATARAX) tablet 10 mg  10 mg Oral TID PRN Charm Rings, NP       Current Outpatient Medications  Medication Sig Dispense Refill   acetaminophen (TYLENOL) 500 MG tablet Take 500-1,000 mg by mouth every 6 (six) hours as needed (for pain.).     Cholecalciferol (VITAMIN D3) 125 MCG (5000 UT) CAPS Take 1 capsule by mouth daily.     cyanocobalamin (,VITAMIN B-12,) 1000 MCG/ML injection Inject 1 mL (1,000 mcg total) into the muscle See admin instructions. B12 injection weekly x 4, followed B12 injection monthly 16 mL 0   ferrous gluconate (FERGON) 324 MG tablet Take 1 tablet (324 mg total) by mouth 2 (two) times daily with a meal. 60 tablet 0   ibuprofen (ADVIL) 600 MG tablet Take 1 tablet (600 mg total) by mouth every 6 (six) hours as needed. 30 tablet 0   Multiple Vitamin  (MULTIVITAMIN WITH MINERALS) TABS tablet Take 1 tablet by mouth daily.     traZODone (DESYREL) 50 MG tablet Take 0.5 tablets (25 mg total) by mouth at bedtime as needed for sleep. 15 tablet 0   zinc gluconate 50 MG tablet Take 50 mg by mouth daily.      Musculoskeletal: Strength & Muscle Tone: within normal limits Gait & Station: normal Patient leans: N/A  Psychiatric Specialty Exam: Physical Exam Vitals reviewed.  HENT:     Head: Normocephalic.  Musculoskeletal:     Cervical back: Normal range of motion.  Neurological:     Mental Status: He is alert.  Psychiatric:        Attention and Perception: Attention normal.        Mood and Affect: Mood is anxious and depressed.        Speech: Speech normal.        Behavior: Behavior normal. Behavior is cooperative.        Thought Content: Thought content normal.        Cognition and Memory: Cognition and memory normal.        Judgment: Judgment normal.     Review of Systems  Musculoskeletal:        Shoulder pain  Psychiatric/Behavioral:  Positive for substance abuse. The patient is nervous/anxious.   All other systems reviewed and are negative.   Blood pressure 137/88, pulse 78, temperature 98.6 F (37 C), temperature source Oral, resp. rate 16, height 5\' 9"  (1.753 m), weight 86.2 kg, SpO2 100 %.Body mass index is 28.06 kg/m.  General Appearance: Casual  Eye Contact:  Good  Speech:  Normal Rate  Volume:  Normal  Mood:  Anxious and Depressed  Affect:  Congruent  Thought Process:  Coherent  Orientation:  Full (Time, Place, and Person)  Thought  Content:  WDL and Logical  Suicidal Thoughts:  No  Homicidal Thoughts:  No  Memory:  Immediate;   Good Recent;   Good Remote;   Good  Judgement:  Fair  Insight:  Fair  Psychomotor Activity:  Decreased  Concentration:  Concentration: Good and Attention Span: Good  Recall:  Good  Fund of Knowledge:  Good  Language:  Good  Akathisia:  No  Handed:  Right  AIMS (if indicated):      Assets:  Housing Leisure Time Physical Health Resilience  ADL's:  Intact  Cognition:  WNL  Sleep:        Physical Exam: Physical Exam Vitals reviewed.  HENT:     Head: Normocephalic.  Musculoskeletal:     Cervical back: Normal range of motion.  Neurological:     Mental Status: He is alert.  Psychiatric:        Attention and Perception: Attention normal.        Mood and Affect: Mood is anxious and depressed.        Speech: Speech normal.        Behavior: Behavior normal. Behavior is cooperative.        Thought Content: Thought content normal.        Cognition and Memory: Cognition and memory normal.        Judgment: Judgment normal.    Review of Systems  Musculoskeletal:        Shoulder pain  Psychiatric/Behavioral:  Positive for substance abuse. The patient is nervous/anxious.   All other systems reviewed and are negative.  Blood pressure (!) 134/99, pulse 66, temperature 98 F (36.7 C), temperature source Oral, resp. rate 13, height 5\' 9"  (1.753 m), weight 86.2 kg, SpO2 98 %. Body mass index is 28.06 kg/m.  Treatment Plan Summary: Stress reaction with mixed disturbance of emotions and conduct, anxiety: Hydroxyzine 10 mg TID PRN  Disposition: No evidence of imminent risk to self or others at present.   Patient does not meet criteria for psychiatric inpatient admission.  Waylan Boga, NP 01/20/2022 4:16 PM

## 2022-01-20 NOTE — ED Notes (Signed)
Pt states he had a recent breakup and has been feeling depressed. States he was drinking a few night ago and feel onto left shoulder. Pt denies any LOC. History of anxiety and depression but not on any medication.

## 2022-01-20 NOTE — BH Assessment (Signed)
Comprehensive Clinical Assessment (CCA) Screening, Triage and Referral Note  01/20/2022 Trevor Torres [id number]  Trevor Torres, 51 year old male who presents to [facility] ED voluntarily for treatment. Per triage note, Pt states he had a recent breakup and has been feeling depressed. States he was drinking a few night ago and feel onto left shoulder. Pt denies any LOC. History of anxiety and depression but not on any medication.   During TTS assessment pt presents alert and oriented x 4, anxious but cooperative, and mood-congruent with affect. The pt does not appear to be responding to internal or external stimuli. Neither is the pt presenting with any delusional thinking. Pt verified the information provided to triage RN.   Pt identifies his main complaint to be that he recently broke up with his ex after dating 6 months and has been feeling "down for the past couple of weeks." Patient reports he hasn't been sleeping well and his anxiety has worsened. Patient also mentioned that his appetite has decreased as well. Patient reports having panic attacks more frequently (2-3 times per week). Patient states he lives alone but has friends that are supportive. Patient reports he is not currently taking any medication but has in the past. Pt denies current SI/HI/AH/VH. Patient reports no prior suicidal or self-harming attempts. Patient is open to outpatient treatment. Pt contracts for safety.    Per Asher Muir, NP, pt does not meet criteria for inpatient psychiatric admission.  Chief Complaint:  Chief Complaint  Patient presents with   Chest Pain   Visit Diagnosis: Anxiety and depression  Patient Reported Information How did you hear about [place]? Self  What Is the Reason for Your Visit/Call Today? Patient reports feeling depressed since break up with ex.  How Long Has This Been Causing You Problems? 1 wk - 1 month  What Do You Feel Would Help You the Most Today? -- (Assessment only)   Have You Recently Had  Any Thoughts About Hurting Yourself? No  Are You Planning to Commit Suicide/Harm Yourself At This time? No   Have you Recently Had Thoughts About Hurting Someone Karolee Ohs? No  Are You Planning to Harm Someone at This Time? No  Explanation: No data recorded  Have You Used Any Alcohol or Drugs in the Past 24 Hours? Yes  How Long Ago Did You Use Drugs or Alcohol? No data recorded What Did You Use and How Much? Alcohol- unknown   Do You Currently Have a Therapist/Psychiatrist? No  Name of Therapist/Psychiatrist: No data recorded  Have You Been Recently Discharged From Any Office Practice or Programs? No  Explanation of Discharge From Practice/Program: No data recorded   CCA Screening Triage Referral Assessment Type of Contact: Face-to-Face  Telemedicine Service Delivery:   Is this Initial or Reassessment? No data recorded Date Telepsych consult ordered in [facility]:  No data recorded Time Telepsych consult ordered in [facility]:  No data recorded Location of Assessment: [facility] ED  Provider Location: [facility] ED   Collateral Involvement: None provided   Does Patient Have a Court Appointed Legal Guardian? No data recorded Name and Contact of Legal Guardian: No data recorded If Minor and Not Living with Parent(s), Who has Custody? n/a  Is CPS involved or ever been involved? No data recorded Is APS involved or ever been involved? No data recorded  Patient Determined To Be At Risk for Harm To Self or Others Based on Review of Patient Reported Information or Presenting Complaint? No  Method: No data recorded Availability of Means:  No data recorded Intent: No data recorded Notification Required: No data recorded Additional Information for Danger to Others Potential: No data recorded Additional Comments for Danger to Others Potential: No data recorded Are There Guns or Other Weapons in Your Home? No data recorded Types of Guns/Weapons: No data recorded Are These Weapons Safely Secured?                             No data recorded Who Could Verify You Are Able To Have These Secured: No data recorded Do You Have any Outstanding Charges, Pending Court Dates, Parole/Probation? No data recorded Contacted To Inform of Risk of Harm To Self or Others: No data recorded  Does Patient Present under Involuntary Commitment? No  IVC Papers Initial File Date: No data recorded  [place] of Residence: [place]   Patient Currently Receiving the Following Services: Not Receiving Services   Determination of Need: Emergent (2 hours)   Options For Referral: ED Visit; Medication Management; Outpatient Therapy   Discharge Disposition:     Clerance Lav, Counselor, LCAS-A

## 2022-01-20 NOTE — ED Triage Notes (Signed)
Pt bib EMS chest pain and SOB. Pt A&Ox4. Pt states he woke just not feeling great and last time he had this feeling his electrolytes were off. Pt had rotator cuff surgery 5 weeks ago.   ASA 324mg  given by EMS

## 2022-01-20 NOTE — ED Provider Notes (Signed)
[facility] Provider Note    Event Date/Time   First MD Initiated Contact with Patient 01/20/22 [phone]     (approximate)   History   Chest Pain   HPI  Trevor Torres is a 51 y.o. male  who presents to the emergency department today because of concern for electrolyte imbalance. The patient says he has been feeling depressed recently. He has felt weak and has had some chest discomfort. When this has happened before he has had his electrolytes be off. He states he has history of depression but is not on any medication. The patient has had decreased appetite and poor sleep.   Physical Exam   Triage Vital Signs: ED Triage Vitals  Enc Vitals Group     BP 01/20/22 0942 (!) 139/118     Pulse Rate 01/20/22 0942 74     Resp 01/20/22 0942 14     Temp 01/20/22 0942 98 F (36.7 C)     Temp Source 01/20/22 0942 Oral     SpO2 01/20/22 0942 97 %     Weight 01/20/22 0940 190 lb (86.2 kg)     Height 01/20/22 0940 5\' 9"  (1.753 m)     Head Circumference --      Peak Flow --      Pain Score 01/20/22 0940 5     Pain Loc --      Pain Edu? --      Excl. in GC? --     Most recent vital signs: Vitals:   01/20/22 0942  BP: (!) 139/118  Pulse: 74  Resp: 14  Temp: 98 F (36.7 C)  SpO2: 97%    General: Awake, alert, oriented. CV:  Good peripheral perfusion. Regular rate and rhythm. Resp:  Normal effort. Lungs clear. Abd:  No distention. Tender to palpation in the right lower quadrant. Other:  Depressed.    ED Results / Procedures / Treatments   Labs (all labs ordered are listed, but only abnormal results are displayed) Labs Reviewed  BASIC METABOLIC PANEL - Abnormal; Notable for the following components:      Result Value   Sodium 132 (*)    CO2 21 (*)    All other components within normal limits  SARS CORONAVIRUS 2 BY RT PCR  CBC  PROCALCITONIN  TROPONIN I (HIGH SENSITIVITY)  TROPONIN I (HIGH SENSITIVITY)     EKG  I, 03/22/22, attending  physician, personally viewed and interpreted this EKG  EKG Time: 0945 Rate: 76 Rhythm: sinus rhythm Axis: normal Intervals: qtc 424 QRS: narrow ST changes: no st elevation Impression: normal ekg    RADIOLOGY I independently interpreted and visualized the CXR. My interpretation: No pneumonia Radiology interpretation:  IMPRESSION:  Patchy opacities in the right base favored to reflect atelectasis  given low lung volumes; however, infection can not be entirely  excluded.      PROCEDURES:  Critical Care performed: No  Procedures   MEDICATIONS ORDERED IN ED: Medications - No data to display   IMPRESSION / MDM / ASSESSMENT AND PLAN / ED COURSE  I reviewed the triage vital signs and the nursing notes.                              Differential diagnosis includes, but is not limited to, depression, acs, pneumonia.   Patient's presentation is most consistent with acute presentation with potential threat to life or bodily function.  Patient presented to the emergency department today because of concerns for depression, chest pain, concern for electrolyte abnormality.  Patient's blood work without any significant electrolyte abnormality.  Sodium is a little low.  Terms of the chest pain troponin was negative.  Chest x-ray raise concern for possible infection however patient's procalcitonin and COVID are negative.  This time I doubt infection.  I do think a lot of the patient's symptoms could be explained by depression.  We will have psychiatry evaluate.     FINAL CLINICAL IMPRESSION(S) / ED DIAGNOSES   Final diagnoses:  Chest pain, unspecified type  Depression, unspecified depression type     Note:  This document was prepared using Dragon voice recognition software and may include unintentional dictation errors.    Phineas Semen, MD 01/20/22 [phone]

## 2022-01-23 ENCOUNTER — Encounter: Payer: Self-pay | Admitting: Oncology

## 2022-02-14 ENCOUNTER — Other Ambulatory Visit (HOSPITAL_COMMUNITY): Payer: Self-pay | Admitting: Psychiatry

## 2022-05-21 ENCOUNTER — Telehealth: Payer: Self-pay | Admitting: Oncology

## 2022-05-21 NOTE — Telephone Encounter (Signed)
Pt would like to do his labs at [place] and will come by next week to get his lab requisition

## 2022-05-22 ENCOUNTER — Inpatient Hospital Stay: Payer: Managed Care, Other (non HMO)

## 2022-05-22 NOTE — Telephone Encounter (Signed)
Thanks. Will leave lab form at registration

## 2022-05-25 ENCOUNTER — Other Ambulatory Visit: Payer: Self-pay

## 2022-05-26 ENCOUNTER — Ambulatory Visit: Payer: Managed Care, Other (non HMO)

## 2022-05-26 ENCOUNTER — Ambulatory Visit: Payer: Managed Care, Other (non HMO) | Admitting: Oncology

## 2022-05-29 MED FILL — Iron Sucrose Inj 20 MG/ML (Fe Equiv): INTRAVENOUS | Qty: 10 | Status: AC

## 2022-06-01 ENCOUNTER — Encounter: Payer: Self-pay | Admitting: Oncology

## 2022-06-01 ENCOUNTER — Inpatient Hospital Stay: Payer: Managed Care, Other (non HMO)

## 2022-06-01 ENCOUNTER — Inpatient Hospital Stay: Payer: Managed Care, Other (non HMO) | Attending: Oncology | Admitting: Oncology

## 2022-06-01 VITALS — BP 130/90 | HR 66 | Temp 97.8°F | Resp 18 | Wt 201.2 lb

## 2022-06-01 DIAGNOSIS — E538 Deficiency of other specified B group vitamins: Secondary | ICD-10-CM | POA: Diagnosis not present

## 2022-06-01 DIAGNOSIS — D508 Other iron deficiency anemias: Secondary | ICD-10-CM

## 2022-06-01 DIAGNOSIS — Z789 Other specified health status: Secondary | ICD-10-CM | POA: Diagnosis not present

## 2022-06-01 MED ORDER — CYANOCOBALAMIN 1000 MCG/ML IJ SOLN: 1000.0000 ug | Freq: Once | INTRAMUSCULAR | Status: DC

## 2022-06-01 MED ORDER — CYANOCOBALAMIN 1000 MCG/ML IJ SOLN
1000.0000 ug | Freq: Once | INTRAMUSCULAR | Status: AC
  Administered 2022-06-01: 1000 ug via INTRAMUSCULAR
  Filled 2022-06-01: qty 1

## 2022-06-01 MED ORDER — VITAMIN B-12 1000 MCG SL SUBL: 1.0000 | SUBLINGUAL_TABLET | Freq: Every day | SUBLINGUAL | 3 refills | Status: AC

## 2022-06-01 NOTE — Assessment & Plan Note (Signed)
ron deficiency anemia.  History of gastric sleeve.   Stable iron saturation at ferritin level.  No need for IV Venofer treatment currently.

## 2022-06-01 NOTE — Assessment & Plan Note (Signed)
Recommend patient to resume sublingual vitamin B12 1000 mcg daily. He will get vitamin B12 injection 1035mcg x 1 today.

## 2022-06-01 NOTE — Progress Notes (Signed)
Hematology/Oncology Progress note [phone] [phone]      Patient Care Team: Juluis Pitch, MD as PCP - General (Family Medicine) Earlie Server, MD as Consulting Physician (Hematology and Oncology)  CHIEF COMPLAINTS/REASON FOR VISIT:  iron deficiency anemia, B12 deficiency.   ASSESSMENT & PLAN:   Iron deficiency anemia ron deficiency anemia.  History of gastric sleeve.   Stable iron saturation at ferritin level.  No need for IV Venofer treatment currently.  Vitamin B12 deficiency Recommend patient to resume sublingual vitamin B12 1000 mcg daily. He will get vitamin B12 injection 1074mcg x 1 today.   No orders of the defined types were placed in this encounter. Follow-up in 6 months  All questions were answered. The patient knows to call the clinic with any problems, questions or concerns.  Earlie Server, MD, PhD [facility] Hematology Oncology 06/01/2022    HISTORY OF PRESENTING ILLNESS:   Trevor Torres is a  52 y.o.  male presents for follow-up of history of iron deficiency anemia and B12 deficiency.  Patient has history of gastric sleeve. 10/01/2020- 10/04/2020 admitted due to near syncope, echo showed LVEF 50-55% with normal diastolic function, no wall motion abnormalities. Hyponatremia, which improved with hydration.  Low B12 level at 60, started on B12 supplementation.  Anemia, microcytic, low iron, started on oral iron supplementation.  Dysphagia, S/p EGD which showed mucosal changes in the duodenum, biopsied & biopsies taken at the gastric body, incisura & gastric antrum. S/p colonoscopy showed non-bleeding internal hemorrhoids.  all biopsies negative for malignancy, dysplasia. Negative H Pylori. 10/16/2020 ED visit for fatigue. Hb slightly improved to 10.9.  10/24/2020 seen [facility]. 11/04/2020 capsule study is negative.   He also has chronic hyponatremia and he puts salt on his food.  Significant family history of cancer- see family history. He  smoked socially for a few years in his 92s. Quitted remotely.   Patient has had daily vitamin B12 injections followed by weekly and currently on monthly B12 injections schedule. Patient was seen by nurse practitioner in November 2022.  He continues to have unintentional weight loss.  CT was obtained for further evaluation.  04/17/2021, CT chest abdomen pelvis with contrast showed coronary artery calcification and aortic atherosclerosis.  No lymphadenopathy.  No acute findings in abdomen and pelvis.  No new adenopathy.  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  Post cholecystectomy.   Family history of cancer. Genetic testing is negative.  INTERVAL HISTORY Trevor Torres is a 52 y.o. male who has above history reviewed by me today presents for follow up visit for management of  iron deficiency anemia, B12 deficiency.  No longer drinks alcohol routinely.  Patient previously took sublingual vitamin B12 supplementation, recently off for a whitel.  He has no new complaints.  Review of Systems  Constitutional:  Negative for appetite change, chills, fatigue, fever and unexpected weight change.  HENT:   Negative for hearing loss and voice change.   Eyes:  Negative for eye problems and icterus.  Respiratory:  Negative for chest tightness, cough and shortness of breath.   Cardiovascular:  Negative for chest pain and leg swelling.  Gastrointestinal:  Negative for abdominal distention and abdominal pain.  Endocrine: Negative for hot flashes.  Genitourinary:  Negative for difficulty urinating, dysuria and frequency.   Musculoskeletal:  Negative for arthralgias.  Skin:  Negative for itching and rash.  Neurological:  Negative for light-headedness and numbness.  Hematological:  Negative for adenopathy. Does not bruise/bleed easily.  Psychiatric/Behavioral:  Negative for confusion.     MEDICAL HISTORY:  Past Medical History:  Diagnosis Date   Anemia    Anxiety    Cellulitis 01/2015    left arm   Family history of breast cancer    Family history of breast cancer    Family history of colon cancer    Family history of kidney cancer    Family history of lung cancer    Family history of prostate cancer    GERD (gastroesophageal reflux disease)    H/O PRIOR TO WEIGHT LOSS   Headache    HTN (hypertension) 01/07/2015   H/O/ GASTRIC SLEEVE /WT LOSS   Hypothyroidism    not since weight loss   MRSA infection 2014   h/o years ago   PONV (postoperative nausea and vomiting) 01/29/2020   Renal insufficiency    error in entry   Sleep apnea    H/O PRIOR TO WEIGHT LOSS    SURGICAL HISTORY: Past Surgical History:  Procedure Laterality Date   CHOLECYSTECTOMY N/A 01/14/2018   Procedure: LAPAROSCOPIC CHOLECYSTECTOMY;  Surgeon: Ancil Linsey, MD;  Location: [facility] ORS;  Service: General;  Laterality: N/A;   COLONOSCOPY  2015   COLONOSCOPY N/A 10/03/2020   Procedure: COLONOSCOPY;  Surgeon: Pasty Spillers, MD;  Location: [facility] ENDOSCOPY;  Service: Endoscopy;  Laterality: N/A;   ESOPHAGOGASTRODUODENOSCOPY  2015   ESOPHAGOGASTRODUODENOSCOPY N/A 10/03/2020   Procedure: ESOPHAGOGASTRODUODENOSCOPY (EGD);  Surgeon: Pasty Spillers, MD;  Location: [facility] ENDOSCOPY;  Service: Endoscopy;  Laterality: N/A;   LAPAROSCOPIC GASTRIC SLEEVE RESECTION  2013   SHOULDER ARTHROSCOPY WITH OPEN ROTATOR CUFF REPAIR Right 02/06/2015   Procedure: SHOULDER ARTHROSCOPY WITH OPEN ROTATOR CUFF REPAIR;  Surgeon: Juanell Fairly, MD;  Location: [facility] ORS;  Service: Orthopedics;  Laterality: Right;   XI ROBOTIC ASSISTED VENTRAL HERNIA N/A 01/29/2020   Procedure: XI ROBOTIC ASSISTED INCISIONAL HERNIA REPAIR WITH MESH;  Surgeon: Carolan Shiver, MD;  Location: [facility] ORS;  Service: General;  Laterality: N/A;    SOCIAL HISTORY: Social History   Socioeconomic History   Marital status: Divorced    Spouse name: Not on file   Number of children: Not on file   Years of education: Not on file    Highest education level: Not on file  Occupational History   Not on file  Tobacco Use   Smoking status: Never   Smokeless tobacco: Never   Tobacco comments:    Occassional cigar  Vaping Use   Vaping Use: Never used  Substance and Sexual Activity   Alcohol use: Yes    Comment: 3-4 beers per week   Drug use: No   Sexual activity: Not on file  Other Topics Concern   Not on file  Social History Narrative   Not on file   Social Determinants of Health   Financial Resource Strain: Not on file  Food Insecurity: Not on file  Transportation Needs: Not on file  Physical Activity: Not on file  Stress: Not on file  Social Connections: Not on file  Intimate Partner Violence: Not on file    FAMILY HISTORY: Family History  Problem Relation Age of Onset   Cancer Mother        Breast Ca   Diabetes Mother    Hypertension Mother    Cancer Father        Lung Ca   Cancer Maternal Aunt        Colon and Kidney Ca   Cancer Maternal Aunt  Breast Ca   Cancer Maternal Grandfather        Lung Ca    ALLERGIES:  has No Known Allergies.  MEDICATIONS:  Current Outpatient Medications  Medication Sig Dispense Refill   acetaminophen (TYLENOL) 500 MG tablet Take 500-1,000 mg by mouth every 6 (six) hours as needed (for pain.).     Cholecalciferol (VITAMIN D3) 125 MCG (5000 UT) CAPS Take 1 capsule by mouth daily.     Cyanocobalamin (VITAMIN B-12) 1000 MCG SUBL Place 1 tablet (1,000 mcg total) under the tongue daily. 90 tablet 3   ferrous gluconate (FERGON) 324 MG tablet Take 1 tablet (324 mg total) by mouth 2 (two) times daily with a meal. 60 tablet 0   ibuprofen (ADVIL) 600 MG tablet Take 1 tablet (600 mg total) by mouth every 6 (six) hours as needed. 30 tablet 0   levothyroxine (SYNTHROID) 50 MCG tablet Take 50 mcg by mouth daily before breakfast.     Multiple Vitamin (MULTIVITAMIN WITH MINERALS) TABS tablet Take 1 tablet by mouth daily.     PARoxetine (PAXIL) 20 MG tablet Take 20 mg by  mouth daily.     zinc gluconate 50 MG tablet Take 50 mg by mouth daily.     traZODone (DESYREL) 50 MG tablet Take 0.5 tablets (25 mg total) by mouth at bedtime as needed for sleep. 15 tablet 0   No current facility-administered medications for this visit.     PHYSICAL EXAMINATION: ECOG PERFORMANCE STATUS: 1 - Symptomatic but completely ambulatory Vitals:   06/01/22 1311  BP: (!) 130/90  Pulse: 66  Resp: 18  Temp: 97.8 F (36.6 C)   Filed Weights   06/01/22 1311  Weight: 201 lb 3.2 oz (91.3 kg)    Physical Exam Constitutional:      General: He is not in acute distress. HENT:     Head: Normocephalic and atraumatic.  Eyes:     General: No scleral icterus. Cardiovascular:     Rate and Rhythm: Normal rate and regular rhythm.     Heart sounds: Normal heart sounds.  Pulmonary:     Effort: Pulmonary effort is normal. No respiratory distress.     Breath sounds: No wheezing.  Abdominal:     General: Bowel sounds are normal. There is no distension.     Palpations: Abdomen is soft.  Musculoskeletal:        General: No deformity. Normal range of motion.     Cervical back: Normal range of motion and neck supple.  Skin:    General: Skin is warm and dry.     Findings: No erythema or rash.  Neurological:     Mental Status: He is alert and oriented to person, place, and time. Mental status is at baseline.     Cranial Nerves: No cranial nerve deficit.     Coordination: Coordination normal.  Psychiatric:        Mood and Affect: Mood normal.     LABORATORY DATA:  I have reviewed the data as listed     Latest Ref Rng & Units 01/20/2022    9:47 AM 11/19/2021    3:21 PM 07/14/2021   12:48 PM  CBC  WBC 4.0 - 10.5 K/uL 5.9  6.0  5.0   Hemoglobin 13.0 - 17.0 g/dL 96.0  45.4  09.8   Hematocrit 39.0 - 52.0 % 44.3  43.3  42.3   Platelets 150 - 400 K/uL 323  282  272       Latest  Ref Rng & Units 01/20/2022    9:47 AM 02/12/2021   11:13 AM 10/16/2020    9:17 PM  CMP  Glucose 70 - 99  mg/dL 89  55  84   BUN 6 - 20 mg/dL 12  8  22    Creatinine 0.61 - 1.24 mg/dL 0.97  1.09  1.07   Sodium 135 - 145 mmol/L 132  129  131   Potassium 3.5 - 5.1 mmol/L 4.1  4.1  4.1   Chloride 98 - 111 mmol/L 98  97  96   CO2 22 - 32 mmol/L 21  26  24    Calcium 8.9 - 10.3 mg/dL 9.7  8.9  9.2   Total Protein 6.5 - 8.1 g/dL  7.3  6.8   Total Bilirubin 0.3 - 1.2 mg/dL  1.0  1.3   Alkaline Phos 38 - 126 U/L  38  33   AST 15 - 41 U/L  30  33   ALT 0 - 44 U/L  27  29     Iron/TIBC/Ferritin/ %Sat    Component Value Date/Time   IRON 131 11/19/2021 1521   IRON 94 12/07/2012 0845   TIBC 309 11/19/2021 1521   TIBC 372 12/07/2012 0845   FERRITIN 55 11/19/2021 1521   FERRITIN 49 12/07/2012 0845   IRONPCTSAT 42 (H) 11/19/2021 1521   IRONPCTSAT 25 12/07/2012 0845       RADIOGRAPHIC STUDIES: I have personally reviewed the radiological images as listed and agreed with the findings in the report. No results found.

## 2022-06-09 ENCOUNTER — Encounter: Payer: Self-pay | Admitting: Oncology

## 2022-11-27 MED FILL — Iron Sucrose Inj 20 MG/ML (Fe Equiv): INTRAVENOUS | Qty: 10 | Status: AC

## 2022-11-30 ENCOUNTER — Inpatient Hospital Stay: Payer: Managed Care, Other (non HMO)

## 2022-11-30 ENCOUNTER — Inpatient Hospital Stay: Payer: Managed Care, Other (non HMO) | Admitting: Oncology

## 2022-11-30 ENCOUNTER — Inpatient Hospital Stay: Payer: Managed Care, Other (non HMO) | Attending: Oncology

## 2022-11-30 ENCOUNTER — Other Ambulatory Visit: Payer: Self-pay

## 2022-11-30 DIAGNOSIS — D508 Other iron deficiency anemias: Secondary | ICD-10-CM

## 2023-08-09 ENCOUNTER — Emergency Department
Admission: EM | Admit: 2023-08-09 | Discharge: 2023-08-09 | Disposition: A | Discharge status: Home / Self Care | Attending: Emergency Medicine | Admitting: Emergency Medicine

## 2023-08-09 ENCOUNTER — Other Ambulatory Visit: Payer: Self-pay

## 2023-08-09 ENCOUNTER — Emergency Department

## 2023-08-09 DIAGNOSIS — R000 Tachycardia, unspecified: Secondary | ICD-10-CM | POA: Diagnosis present

## 2023-08-09 DIAGNOSIS — R002 Palpitations: Secondary | ICD-10-CM | POA: Diagnosis not present

## 2023-08-09 LAB — CBC
HCT: 41.7 % (ref 39.0–52.0)
Hemoglobin: 14.8 g/dL (ref 13.0–17.0)
MCH: 34.6 pg — ABNORMAL HIGH (ref 26.0–34.0)
MCHC: 35.5 g/dL (ref 30.0–36.0)
MCV: 97.4 fL (ref 80.0–100.0)
Platelets: 326 10*3/uL (ref 150–400)
RBC: 4.28 MIL/uL (ref 4.22–5.81)
RDW: 13.7 % (ref 11.5–15.5)
WBC: 5.9 10*3/uL (ref 4.0–10.5)
nRBC: 0 % (ref 0.0–0.2)

## 2023-08-09 LAB — COMPREHENSIVE METABOLIC PANEL
ALT: 21 U/L (ref 0–44)
AST: 26 U/L (ref 15–41)
Albumin: 4.3 g/dL (ref 3.5–5.0)
Alkaline Phosphatase: 35 U/L — ABNORMAL LOW (ref 38–126)
Anion gap: 11 (ref 5–15)
BUN: 12 mg/dL (ref 6–20)
CO2: 23 mmol/L (ref 22–32)
Calcium: 9.5 mg/dL (ref 8.9–10.3)
Chloride: 105 mmol/L (ref 98–111)
Creatinine, Ser: 0.88 mg/dL (ref 0.61–1.24)
GFR, Estimated: >60 mL/min (ref >60)
Glucose, Bld: 81 mg/dL (ref 70–99)
Potassium: 4.2 mmol/L (ref 3.5–5.1)
Sodium: 139 mmol/L (ref 135–145)
Total Bilirubin: 1 mg/dL (ref 0.0–1.2)
Total Protein: 7.3 g/dL (ref 6.5–8.1)

## 2023-08-09 LAB — TROPONIN I (HIGH SENSITIVITY): Troponin I (High Sensitivity): 2 ng/L (ref <18)

## 2023-08-09 NOTE — ED Provider Notes (Signed)
 [facility] Provider Note    Event Date/Time   First MD Initiated Contact with Patient 08/09/23 1606     (approximate)   History   Tachycardia and Chest Pain   HPI  Trevor Torres is a 53 y.o. male with a history of anxiety, anemia who presents with complaints of palpitations and chest tightness, now resolved.  Patient reports he was at work, walking when he noticed that his heart was beating more rapidly.  His heart rate rate was 125 per his Apple Watch.  Symptoms resolved relatively quickly but he was recommended to come be evaluated in the emergency department     Physical Exam   Triage Vital Signs: ED Triage Vitals  Encounter Vitals Group     BP 08/09/23 1143 (!) 152/111     Systolic BP Percentile --      Diastolic BP Percentile --      Pulse Rate 08/09/23 1143 81     Resp 08/09/23 1143 18     Temp 08/09/23 1143 98.5 F (36.9 C)     Temp Source 08/09/23 1611 Oral     SpO2 08/09/23 1143 100 %     Weight 08/09/23 1141 90.7 kg (200 lb)     Height 08/09/23 1141 1.753 m (5\' 9" )     Head Circumference --      Peak Flow --      Pain Score 08/09/23 1141 0     Pain Loc --      Pain Education --      Exclude from Growth Chart --     Most recent vital signs: Vitals:   08/09/23 1143 08/09/23 1611  BP: (!) 152/111 (!) 148/103  Pulse: 81 62  Resp: 18 13  Temp: 98.5 F (36.9 C) 98.1 F (36.7 C)  SpO2: 100% 100%     General: Awake, no distress.  CV:  Good peripheral perfusion.  Regular rate and rhythm Resp:  Normal effort.  Clear to auscultation bilaterally Abd:  No distention.  Other:  No calf pain or swelling   ED Results / Procedures / Treatments   Labs (all labs ordered are listed, but only abnormal results are displayed) Labs Reviewed  CBC - Abnormal; Notable for the following components:      Result Value   MCH 34.6 (*)    All other components within normal limits  COMPREHENSIVE METABOLIC PANEL - Abnormal; Notable for the  following components:   Alkaline Phosphatase 35 (*)    All other components within normal limits  TROPONIN I (HIGH SENSITIVITY)  TROPONIN I (HIGH SENSITIVITY)     EKG  ED ECG REPORT I, Jene Every, the attending physician, personally viewed and interpreted this ECG.  Date: 08/09/2023  Rhythm: normal sinus rhythm QRS Axis: normal Intervals: normal ST/T Wave abnormalities: normal Narrative Interpretation: no evidence of acute ischemia    RADIOLOGY Chest x-ray viewed interpret by me, no acute abnormality    PROCEDURES:  Critical Care performed:   Procedures   MEDICATIONS ORDERED IN ED: Medications - No data to display   IMPRESSION / MDM / ASSESSMENT AND PLAN / ED COURSE  I reviewed the triage vital signs and the nursing notes. Patient's presentation is most consistent with acute presentation with potential threat to life or bodily function.  Patient presents with palpitations, chest tightness, differential includes arrhythmia, less likely ACS, anxiety, electrolyte abnormality  EKG is quite reassuring, high sensitive troponin is normal.  Not consistent with ACS, no  signs of arrhythmia on EKG although this is still a possibility.  Lab work is reassuring, sodium is normal, he reports in the past he was had low sodium which is caused similar symptoms.  No indication for admission at this time as the patient is asymptomatic with reassuring workup, I have referred him for cardiology follow-up, he agrees with this plan        FINAL CLINICAL IMPRESSION(S) / ED DIAGNOSES   Final diagnoses:  Palpitations     Rx / DC Orders   ED Discharge Orders          Ordered    Ambulatory referral to Cardiology       Comments: If you have not heard from the Cardiology office within the next 72 hours please call [phone].   08/09/23 1644             Note:  This document was prepared using Dragon voice recognition software and may include unintentional  dictation errors.   Jene Every, MD 08/09/23 [phone]

## 2023-08-09 NOTE — ED Triage Notes (Signed)
 Pt presents to the ED from the [facility] clinic. Pt states that he was at work when he felt like his heart started racing. Pt states that he felt weak, dizzy, and had some chest tightness. Pt states that the last time he felt like this he had a low sodium. Pt A&Ox4 at time of triage.

## 2023-08-09 NOTE — ED Notes (Signed)
 First nurse note: Pt here from [facility] clinic with high rate, rate normal per [facility] clinic. Pt has HX of anxiety. Pt worked out this morning.
